# Patient Record
Sex: Male | Born: 1950 | Race: Black or African American | Hispanic: No | Marital: Married | Smoking: Former smoker
Health system: Southern US, Community
[De-identification: ages and names within clinical notes are randomized; demographics above are authoritative.]

## PROBLEM LIST (undated history)

## (undated) DIAGNOSIS — E119 Type 2 diabetes mellitus without complications: Secondary | ICD-10-CM

## (undated) DIAGNOSIS — Z5320 Procedure and treatment not carried out because of patient's decision for unspecified reasons: Secondary | ICD-10-CM

## (undated) DIAGNOSIS — M5136 Other intervertebral disc degeneration, lumbar region: Secondary | ICD-10-CM

## (undated) DIAGNOSIS — I10 Essential (primary) hypertension: Secondary | ICD-10-CM

## (undated) DIAGNOSIS — E785 Hyperlipidemia, unspecified: Secondary | ICD-10-CM

## (undated) DIAGNOSIS — M51369 Other intervertebral disc degeneration, lumbar region without mention of lumbar back pain or lower extremity pain: Secondary | ICD-10-CM

## (undated) HISTORY — DX: Type 2 diabetes mellitus without complications: E11.9

## (undated) HISTORY — PX: HYDROCELE EXCISION / REPAIR: SUR1145

## (undated) HISTORY — DX: Hyperlipidemia, unspecified: E78.5

## (undated) HISTORY — DX: Other intervertebral disc degeneration, lumbar region: M51.36

## (undated) HISTORY — DX: Procedure and treatment not carried out because of patient's decision for unspecified reasons: Z53.20

## (undated) HISTORY — PX: TOOTH EXTRACTION: SUR596

## (undated) HISTORY — DX: Other intervertebral disc degeneration, lumbar region without mention of lumbar back pain or lower extremity pain: M51.369

---

## 2011-02-08 HISTORY — PX: KIDNEY STONE SURGERY: SHX686

## 2012-07-03 ENCOUNTER — Emergency Department (HOSPITAL_BASED_OUTPATIENT_CLINIC_OR_DEPARTMENT_OTHER): Payer: BC Managed Care – PPO

## 2012-07-03 ENCOUNTER — Emergency Department (HOSPITAL_BASED_OUTPATIENT_CLINIC_OR_DEPARTMENT_OTHER)
Admission: EM | Admit: 2012-07-03 | Discharge: 2012-07-03 | Disposition: A | Discharge status: Home / Self Care | Payer: BC Managed Care – PPO | Attending: Emergency Medicine | Admitting: Emergency Medicine

## 2012-07-03 ENCOUNTER — Encounter (HOSPITAL_BASED_OUTPATIENT_CLINIC_OR_DEPARTMENT_OTHER): Payer: Self-pay | Admitting: *Deleted

## 2012-07-03 DIAGNOSIS — N200 Calculus of kidney: Secondary | ICD-10-CM | POA: Insufficient documentation

## 2012-07-03 DIAGNOSIS — N1330 Unspecified hydronephrosis: Secondary | ICD-10-CM | POA: Insufficient documentation

## 2012-07-03 DIAGNOSIS — N509 Disorder of male genital organs, unspecified: Secondary | ICD-10-CM | POA: Insufficient documentation

## 2012-07-03 DIAGNOSIS — R110 Nausea: Secondary | ICD-10-CM | POA: Insufficient documentation

## 2012-07-03 LAB — URINALYSIS, ROUTINE W REFLEX MICROSCOPIC
Ketones, ur: NEGATIVE mg/dL
Leukocytes, UA: NEGATIVE
Nitrite: NEGATIVE
Specific Gravity, Urine: 1.017 (ref 1.005–1.030)
pH: 6 (ref 5.0–8.0)

## 2012-07-03 LAB — URINE MICROSCOPIC-ADD ON

## 2012-07-03 LAB — BASIC METABOLIC PANEL
Calcium: 10.3 mg/dL (ref 8.4–10.5)
Creatinine, Ser: 1.2 mg/dL (ref 0.50–1.35)
GFR calc Af Amer: 73 mL/min — ABNORMAL LOW (ref >90)
GFR calc non Af Amer: 63 mL/min — ABNORMAL LOW (ref >90)
Sodium: 141 mEq/L (ref 135–145)

## 2012-07-03 LAB — CBC WITH DIFFERENTIAL/PLATELET
Basophils Absolute: 0 10*3/uL (ref 0.0–0.1)
Eosinophils Relative: 3 % (ref 0–5)
Lymphocytes Relative: 30 % (ref 12–46)
Neutro Abs: 3.6 10*3/uL (ref 1.7–7.7)
Neutrophils Relative %: 55 % (ref 43–77)
Platelets: 195 10*3/uL (ref 150–400)
RBC: 5.5 MIL/uL (ref 4.22–5.81)
RDW: 13.3 % (ref 11.5–15.5)
WBC: 6.6 10*3/uL (ref 4.0–10.5)

## 2012-07-03 MED ORDER — IBUPROFEN 600 MG PO TABS: 600.0000 mg | ORAL_TABLET | Freq: Four times a day (QID) | ORAL | Status: DC | PRN

## 2012-07-03 MED ORDER — ONDANSETRON 8 MG PO TBDP: 8.0000 mg | ORAL_TABLET | Freq: Three times a day (TID) | ORAL | Status: DC | PRN

## 2012-07-03 MED ORDER — HYDROCODONE-ACETAMINOPHEN 5-325 MG PO TABS: 1.0000 | ORAL_TABLET | Freq: Four times a day (QID) | ORAL | Status: DC | PRN

## 2012-07-03 MED ORDER — HYDROCODONE-ACETAMINOPHEN 5-325 MG PO TABS
1.0000 | ORAL_TABLET | Freq: Once | ORAL | Status: AC
  Administered 2012-07-03: 1 via ORAL
  Filled 2012-07-03: qty 1

## 2012-07-03 MED ORDER — MORPHINE SULFATE 4 MG/ML IJ SOLN
4.0000 mg | Freq: Once | INTRAMUSCULAR | Status: AC
  Administered 2012-07-03: 4 mg via INTRAVENOUS
  Filled 2012-07-03: qty 1

## 2012-07-03 MED ORDER — TAMSULOSIN HCL 0.4 MG PO CAPS: 0.4000 mg | ORAL_CAPSULE | Freq: Every day | ORAL | Status: DC

## 2012-07-03 NOTE — ED Notes (Signed)
Patient transported to CT 

## 2012-07-03 NOTE — ED Notes (Signed)
Pt c/o pain in his left scrotum amd left inguinal area radiating up into his abd. Pt sts he feels the urge to urinate but cannot. Pt sts he had a hydrocele before that felt similar.

## 2012-07-03 NOTE — ED Notes (Signed)
Pt back from CT

## 2012-07-03 NOTE — ED Notes (Signed)
MD at bedside. 

## 2012-07-03 NOTE — ED Provider Notes (Addendum)
History     CSN: [id number]  Arrival date & time 07/03/12  0300   First MD Initiated Contact with Patient 07/03/12 [phone]      Chief Complaint  Patient presents with  . Groin Pain    (Consider location/radiation/quality/duration/timing/severity/associated sxs/prior treatment) HPI Comments: Pt comes in with cc of left sided scrotal, ingional and LLQ abd pain. The pain onset was sudden, around 1 am, woke him up from sleep. The pain is intermittent, and severe when present. No back pain, + nausea, diaphoresis. No hx of renal stones, no trauma, no uti like sx.  Patient is a 62 y.o. male presenting with groin pain. The history is provided by the patient.  Groin Pain Associated symptoms include abdominal pain. Pertinent negatives include no chest pain and no shortness of breath.    History reviewed. No pertinent past medical history.  Past Surgical History  Procedure Laterality Date  . Hydrocele excision / repair      No family history on file.  History  Substance Use Topics  . Smoking status: Never Smoker   . Smokeless tobacco: Not on file  . Alcohol Use: No      Review of Systems  Constitutional: Negative for activity change and appetite change.  Respiratory: Negative for cough and shortness of breath.   Cardiovascular: Negative for chest pain.  Gastrointestinal: Positive for nausea and abdominal pain.  Genitourinary: Positive for testicular pain. Negative for dysuria and penile pain.    Allergies  Review of patient's allergies indicates no known allergies.  Home Medications  No current outpatient prescriptions on file.  BP 191/107  Pulse 76  Temp(Src) 98.6 F (37 C) (Oral)  Resp 18  Wt 220 lb (99.791 kg)  SpO2 96%  Physical Exam  Nursing note and vitals reviewed. Constitutional: He is oriented to person, place, and time. He appears well-developed.  HENT:  Head: Normocephalic and atraumatic.  Eyes: Conjunctivae and EOM are normal. Pupils are equal, round,  and reactive to light.  Neck: Normal range of motion. Neck supple.  Cardiovascular: Normal rate and regular rhythm.   Pulmonary/Chest: Effort normal and breath sounds normal.  Abdominal: Soft. Bowel sounds are normal. He exhibits no distension. There is no tenderness. There is no rebound and no guarding.  Genitourinary:  No scrotal swelling, erythema, mild left testicular tenderness with palpation, no inguinal hernia, mild left sided flank tenderness, mild suprapubic tenderness  Neurological: He is alert and oriented to person, place, and time.  Skin: Skin is warm.    ED Course  Procedures (including critical care time)  Labs Reviewed  CBC WITH DIFFERENTIAL  BASIC METABOLIC PANEL  URINALYSIS, ROUTINE W REFLEX MICROSCOPIC   No results found.   No diagnosis found.    MDM  Pt comes in with cc of left sided lower abd pain, groin pain, testicular pain.  DDx includes: Diverticulitis Hernia Nephrolithiasis Pyelonephritis UTI/Cystitis STD Testicular torsion Epididymitis Hydrocoele Inguinal hernia Fournier's gangrene (necrotizing fasciitis of the perineum),  Torsion of the appendix testis Testicular cancer  Based on hx and exam - no concerns for torsion, or scrotal infection. This could be due to mobile ureteral stone. With no hx of renal stones, will get CT scan in this elderly male, to ensure we are not missing anything else critical. During the exam, the patient was comfortable - which makes a non obstructive stone possibility more likely.   Derwood Kaplan, MD 07/03/12 0503  6:02 AM CT results reviewed. Non specific perinephric far stranding with a stone  right at the renal pelvis with mild hydronephrosis. No ureteral stone. Will tx for stone, as suspicion for any acute scrotal condition or vascular problem  low. Will dc. Return precautions discussed  Derwood Kaplan, MD 07/03/12 [phone]

## 2012-08-15 ENCOUNTER — Emergency Department (HOSPITAL_BASED_OUTPATIENT_CLINIC_OR_DEPARTMENT_OTHER)
Admission: EM | Admit: 2012-08-15 | Discharge: 2012-08-15 | Disposition: A | Discharge status: Home / Self Care | Payer: BC Managed Care – PPO | Attending: Emergency Medicine | Admitting: Emergency Medicine

## 2012-08-15 ENCOUNTER — Encounter (HOSPITAL_BASED_OUTPATIENT_CLINIC_OR_DEPARTMENT_OTHER): Payer: Self-pay | Admitting: Emergency Medicine

## 2012-08-15 DIAGNOSIS — Z79899 Other long term (current) drug therapy: Secondary | ICD-10-CM | POA: Insufficient documentation

## 2012-08-15 DIAGNOSIS — R109 Unspecified abdominal pain: Secondary | ICD-10-CM | POA: Insufficient documentation

## 2012-08-15 DIAGNOSIS — I10 Essential (primary) hypertension: Secondary | ICD-10-CM

## 2012-08-15 DIAGNOSIS — R319 Hematuria, unspecified: Secondary | ICD-10-CM

## 2012-08-15 DIAGNOSIS — Z87442 Personal history of urinary calculi: Secondary | ICD-10-CM | POA: Insufficient documentation

## 2012-08-15 DIAGNOSIS — N23 Unspecified renal colic: Secondary | ICD-10-CM

## 2012-08-15 LAB — URINALYSIS, ROUTINE W REFLEX MICROSCOPIC
Glucose, UA: NEGATIVE mg/dL
Protein, ur: NEGATIVE mg/dL

## 2012-08-15 LAB — URINE MICROSCOPIC-ADD ON

## 2012-08-15 MED ORDER — LISINOPRIL 10 MG PO TABS: 10.0000 mg | ORAL_TABLET | Freq: Every day | ORAL | Status: DC

## 2012-08-15 MED ORDER — CLONIDINE HCL 0.1 MG PO TABS
0.1000 mg | ORAL_TABLET | Freq: Once | ORAL | Status: AC
  Administered 2012-08-15: 0.1 mg via ORAL
  Filled 2012-08-15: qty 1

## 2012-08-15 MED ORDER — HYDROCODONE-ACETAMINOPHEN 5-325 MG PO TABS: 2.0000 | ORAL_TABLET | ORAL | Status: DC | PRN

## 2012-08-15 NOTE — ED Notes (Signed)
Pt reports blood in urine since this am and lower abd pain.

## 2012-08-15 NOTE — ED Provider Notes (Addendum)
History    CSN: [id number] Arrival date & time 08/15/12  1909  First MD Initiated Contact with Patient 08/15/12 1923     Chief Complaint  Patient presents with  . Hematuria  . Abdominal Pain   (Consider location/radiation/quality/duration/timing/severity/associated sxs/prior Treatment) HPI Pt with known 5 x 6 mm R renal stone diagnosed 1 month prior. Referred to urologist but has yet to see. States today he has had episodic suprapubic pressure and noticed dark urine today. No fever or chills. No N/V. No current symptoms.  Past Medical History  Diagnosis Date  . Kidney stone    Past Surgical History  Procedure Laterality Date  . Hydrocele excision / repair     No family history on file. History  Substance Use Topics  . Smoking status: Never Smoker   . Smokeless tobacco: Not on file  . Alcohol Use: No    Review of Systems  Constitutional: Negative for fever and chills.  Gastrointestinal: Positive for abdominal pain. Negative for nausea, vomiting and diarrhea.  Genitourinary: Positive for hematuria. Negative for dysuria and flank pain.  Musculoskeletal: Negative for back pain.  Skin: Negative for rash and wound.  All other systems reviewed and are negative.    Allergies  Review of patient's allergies indicates no known allergies.  Home Medications   Current Outpatient Rx  Name  Route  Sig  Dispense  Refill  . HYDROcodone-acetaminophen (NORCO) 5-325 MG per tablet   Oral   Take 2 tablets by mouth every 4 (four) hours as needed for pain.   10 tablet   0   . HYDROcodone-acetaminophen (NORCO/VICODIN) 5-325 MG per tablet   Oral   Take 1 tablet by mouth every 6 (six) hours as needed for pain (severe pain only).   15 tablet   0   . ibuprofen (ADVIL,MOTRIN) 600 MG tablet   Oral   Take 1 tablet (600 mg total) by mouth every 6 (six) hours as needed for pain.   30 tablet   0   . lisinopril (PRINIVIL,ZESTRIL) 10 MG tablet   Oral   Take 1 tablet (10 mg total) by  mouth daily.   30 tablet   0   . ondansetron (ZOFRAN ODT) 8 MG disintegrating tablet   Oral   Take 1 tablet (8 mg total) by mouth every 8 (eight) hours as needed for nausea.   20 tablet   0   . tamsulosin (FLOMAX) 0.4 MG CAPS   Oral   Take 1 capsule (0.4 mg total) by mouth daily.   7 capsule   0    BP 182/101  Pulse 62  Temp(Src) 98.3 F (36.8 C) (Oral)  Resp 18  Ht 5' 10.5" (1.791 m)  Wt 220 lb (99.791 kg)  BMI 31.11 kg/m2  SpO2 97% Physical Exam  Nursing note and vitals reviewed. Constitutional: He is oriented to person, place, and time. He appears well-developed and well-nourished. No distress.  HENT:  Head: Normocephalic and atraumatic.  Mouth/Throat: Oropharynx is clear and moist.  Eyes: EOM are normal. Pupils are equal, round, and reactive to light.  Neck: Normal range of motion. Neck supple.  Cardiovascular: Normal rate and regular rhythm.   Pulmonary/Chest: Effort normal and breath sounds normal. No respiratory distress. He has no wheezes. He has no rales.  Abdominal: Soft. Bowel sounds are normal. He exhibits no distension and no mass. There is no tenderness. There is no rebound and no guarding.  Musculoskeletal: Normal range of motion. He exhibits no edema  and no tenderness.  Neurological: He is alert and oriented to person, place, and time.  Skin: Skin is warm and dry. No rash noted. No erythema.  Psychiatric: He has a normal mood and affect. His behavior is normal.    ED Course  Procedures (including critical care time) Labs Reviewed  URINALYSIS, ROUTINE W REFLEX MICROSCOPIC - Abnormal; Notable for the following:    Specific Gravity, Urine 1.004 (*)    Hgb urine dipstick LARGE (*)    All other components within normal limits  URINE MICROSCOPIC-ADD ON - Abnormal; Notable for the following:    Squamous Epithelial / LPF FEW (*)    All other components within normal limits   No results found. 1. Hematuria   2. Renal colic   3. Uncontrolled hypertension      MDM  Pt encouraged to f/u with urologist. Will refill Norco. Return precautions given.   Loren Racer, MD 08/15/12 2004  Pt with history of longstanding HTN not being treated. He currently has no HA,visual changes weakness. Will start low dose lisinopril and pt is advised to establish PMD to manage BP. Return precautions given.   Loren Racer, MD 08/15/12 2021

## 2012-08-15 NOTE — Discharge Instructions (Signed)
You need to establish a primary care doctor to manage your blood pressure.   Kidney Stones Kidney stones (ureteral lithiasis) are deposits that form inside your kidneys. The intense pain is caused by the stone moving through the urinary tract. When the stone moves, the ureter goes into spasm around the stone. The stone is usually passed in the urine.  CAUSES   A disorder that makes certain neck glands produce too much parathyroid hormone (primary hyperparathyroidism).  A buildup of uric acid crystals.  Narrowing (stricture) of the ureter.  A kidney obstruction present at birth (congenital obstruction).  Previous surgery on the kidney or ureters.  Numerous kidney infections. SYMPTOMS   Feeling sick to your stomach (nauseous).  Throwing up (vomiting).  Blood in the urine (hematuria).  Pain that usually spreads (radiates) to the groin.  Frequency or urgency of urination. DIAGNOSIS   Taking a history and physical exam.  Blood or urine tests.  Computerized X-ray scan (CT scan).  Occasionally, an examination of the inside of the urinary bladder (cystoscopy) is performed. TREATMENT   Observation.  Increasing your fluid intake.  Surgery may be needed if you have severe pain or persistent obstruction. The size, location, and chemical composition are all important variables that will determine the proper choice of action for you. Talk to your caregiver to better understand your situation so that you will minimize the risk of injury to yourself and your kidney.  HOME CARE INSTRUCTIONS   Drink enough water and fluids to keep your urine clear or pale yellow.  Strain all urine through the provided strainer. Keep all particulate matter and stones for your caregiver to see. The stone causing the pain may be as small as a grain of salt. It is very important to use the strainer each and every time you pass your urine. The collection of your stone will allow your caregiver to analyze it  and verify that a stone has actually passed.  Only take over-the-counter or prescription medicines for pain, discomfort, or fever as directed by your caregiver.  Make a follow-up appointment with your caregiver as directed.  Get follow-up X-rays if required. The absence of pain does not always mean that the stone has passed. It may have only stopped moving. If the urine remains completely obstructed, it can cause loss of kidney function or even complete destruction of the kidney. It is your responsibility to make sure X-rays and follow-ups are completed. Ultrasounds of the kidney can show blockages and the status of the kidney. Ultrasounds are not associated with any radiation and can be performed easily in a matter of minutes. SEEK IMMEDIATE MEDICAL CARE IF:   Pain cannot be controlled with the prescribed medicine.  You have a fever.  The severity or intensity of pain increases over 18 hours and is not relieved by pain medicine.  You develop a new onset of abdominal pain.  You feel faint or pass out. MAKE SURE YOU:   Understand these instructions.  Will watch your condition.  Will get help right away if you are not doing well or get worse. Document Released: 01/24/2005 Document Revised: 04/18/2011 Document Reviewed: 05/22/2009 [facility] Patient Information 2014 [place], [place].  RESOURCE GUIDE  Chronic Pain Problems: Contact [facility]  [phone] Patients need to be referred by their primary care doctor.  Insufficient Money for Medicine: Contact United Way:  call "211."   No Primary Care Doctor: - Call Health Connect  [phone] - can help you locate a  primary care doctor that  accepts your insurance, provides certain services, etc. - Physician Referral Service- [phone]  Agencies that provide inexpensive medical care: - [facility]  [phone] - [facility]  [phone] - [facility]  [phone] - Women's  Clinic  [phone] - Planned Parenthood  [phone] [facility]  [phone]  Medicaid-accepting [facility] Providers: - [facility]- [address]  [phone], Mon-Fri 9am-7pm, Sat 9am-1pm - [facility]- [address] [place], Suite [place]  [phone] - [facility]- [address] [place]  [phone] [facility]- [address]  [phone] - Renaye Rakers- [address] [place], [address], [phone]  Only accepts [place] patients after they have their name  applied to their card  Self Pay (no insurance) in [facility]: - Sickle Cell Patients - [facility]  [address] [place], [phone] - [facility]gent Care- [address] [place]  [phone]       [facility] [place]- [address]       -     [facility]- see information above (Speak to [employer] if you do not have insurance)       -  [facility]- [address],  [phone]       -  [facility]- [address] [phone]       -  Dr Julio Sicks-  [address], [place], [phone]       -  [facility] - [address] [phone]       -  [facility]- [address], [phone], also [address] [phone]       -     [facility]- [address] [place], [phone], 1st & 3rd Saturday         every month, 10am-1pm  -     [facility]   [address] [place], [place].   Phone:  [phone], Fax:  [phone]. Hours of Operation:  9 am - 6 pm, M-F.  -     [facility]   [address], [place]   Phone: [phone], Fax: [phone]. Hours of Operation:  8:30 am - 5:30 pm, M-F.  [facility] [address] [place], [place] [address] [phone]  [facility] [address] [place], [place] [address] [phone]  1) Find a Doctor and Pay Out of Pocket Although you won't have to find out who is covered by your insurance plan, it is a good idea to ask around and get recommendations. You will then need to call the office and see if the doctor you have chosen will accept you as a new patient and what types of options they offer for patients who are self-pay. Some doctors offer discounts or will set up payment plans for their patients who do not have insurance, but you will need to ask so you aren't surprised when you get to your appointment.  2) Contact Your Local Health Department Not all health departments have doctors that can see patients for sick visits, but many do, so it is worth a call to see if yours does. If you don't  know where your local health department is, you can check in your phone book. The CDC also has a tool to help you locate your state's health department, and many state websites also have listings of all of their local health departments.  3) Find a Walk-in Clinic If your illness is not likely to be very severe or complicated, you may want to try a walk in clinic. These are popping up all over the country in pharmacies, drugstores, and shopping centers. They're usually staffed by nurse practitioners or physician assistants that have been trained to treat common illnesses and complaints. They're usually fairly quick and inexpensive. However, if you have serious medical issues or chronic medical problems, these are probably not your best option  STD Testing - [facility] of [facility], STD Clinic, [address], [place], phone [phone] or [phone].  Monday - Friday, call for an appointment. [facility] of [employer], STD Clinic, [address], [place], phone [phone] or [phone].  Monday - Friday, call for an appointment.  Abuse/Neglect: [facility] Child  Abuse Hotline 302-639-9898 [facility] Child Abuse Hotline [phone] (After Hours)  Emergency Shelter:  [facility] [phone]  Maternity Homes: - [facility] [phone] - [facility] [phone]  MRSA Hotline #:   571 538 1318  Dental Assistance If unable to pay or uninsured, contact:  [facility]. to become qualified for the adult dental clinic.  Patients with Medicaid: [facility] [phone] [address] Qu[address], [phone] [address] [phone]  If unable to pay, or uninsured, contact [facility] [phone] in [place], [phone] in [facility]) to become qualified for the adult dental clinic  [facility] [address] [place], [place] [address] [phone] [email or web address]  Other Proofreader Services: - [facility]- [address] [place], [place], [place], [address], [phone], Ext. 123, 2nd and 4th Thursday of the month at 6:30am.  10 clients each day by appointment, can sometimes see walk-in patients if someone does not show for an appointment. [facility]- [address] [place], [place], [address], [phone] - [facility] [address], [place], [place], [address], [phone] - [facility]- [phone] [facility]- [phone] [facility] Health [phone]       Behavioral Health Resources in the [facility]  Intensive Outpatient Programs: [facility]      [address] [place], [place] [phone] Both a day and evening program       [facility] Outpatient     [address]        [place], [place] [address] [phone]         ADS: [facility] [address] [place] [phone]  [facility] Mental Health ACCESS LINE: [phone] or [phone] [address] [place], [place] [address] [email or web address]   Substance Abuse Resources: - Alcohol and Drug Services  ([phone] - [facility] [phone] - [facility] [phone] Floydene Flock [phone] - Residential & Outpatient Substance Abuse Program  [phone]  Psychological Services: [facility]  [phone] [facility]  [phone] - [facility], [phone] [place]. [address] [place], ACCESS LINE: 430-136-1728 or (305)[phone], [email or web address]  Mobile Crisis Teams:  [facility]atives         Mobile Crisis Care Unit [phone]             [facility] [address] Ginette Otto [phone]                                         Interventionist [address] [place] [address] [place] [phone]  Self-Help/Support Groups: Mental Health Assoc. of [employer] of support groups (907)168-8307 (call for more info)  Narcotics Anonymous (NA) Caring [facility] [address] [place] - 2 meetings at this location  Residential Treatment Programs:  [facility]      [address]        [place]       [phone]         [facility] [address], [place] [address]74 [place], [place]  [address] [phone]  [facility]  [address] [place], [place] [address] [phone] Admissions: 8am-3pm M-F  [facility]     [address]        [place], [place] [address]       [phone]         [facility] [address] [place], [place] [phone]  [facility] [address] [place], [place] [phone]  [facility] - Intensive Outpatient      [address]     [place], [place]       [phone]         [facility] (Addiction  Recovery Care Assoc.)     [address] [place], [place] [phone] or [phone]  Residential Treatment Services (RTS), Medicaid [address] [place], [place] [phone]  [facility]ship [address]                                               [address] [place] [phone]  [facility] Resources: [phone]               General Therapy                                                Angie Fava, PhD        [address] [place], [place] [address]         [phone]   Insurance  [facility]   [address] [place], [place] [address] [phone]  [facility] [address] [place], [place] [address] [phone] Insurance/Medicaid/sponsorship through [facility] Families                                              [address] [phone]  [place], [place] [address]    Therapy/tele-psych/case         [phone]          [facility] [address], [place]  [address]  Adolescent/group home/case management [phone]                                           Creola Corn PhD       General therapy       Insurance   [phone]         Dr. Lolly Mustache, [place], M-F [phone]  [facility] [place]  [employer] [facility] Dept. [address]                 [address]         [address] [place] [address]  Blondell Reveal Phone:  [phone]                                  Phone:  [phone]                   Phone:  [phone]  [facility] Mental Health, [phone] - [facility] - [facility]- [phone]       -     [facility] in [place], [address]             [phone], Insurance  [facility] Abuse  Hotline 2023155522 or [phone] (After Hours)

## 2012-08-21 ENCOUNTER — Other Ambulatory Visit: Payer: Self-pay | Admitting: Urology

## 2012-08-23 ENCOUNTER — Encounter (HOSPITAL_COMMUNITY): Payer: Self-pay | Admitting: *Deleted

## 2012-08-26 NOTE — H&P (Signed)
ctive Problems Problems  1. Gross Hematuria 599.71 2. Nephrolithiasis Left 592.0  History of Present Illness  Maxwell Hanson is a 62 yo BM who is sent from the ER.  He had the onset one month ago of mid lower abdominal pain.  The pain was severe with some nausea without vomiting.   He had a CT on 5/27 that showed a 5x81mm left upper stone with calyceal obstruction.   He was sent out with pain med and a strainer.   He had the onset last week of gross hematuria and went back to the ER.  He hasn't passed a stone.  He has no voiding symptoms.  He has no prior GU history.   Past Medical History Problems  1. History of  Hypertension 401.9 2. History of  Hypertension 401.9 3. History of  Nephrolithiasis V13.01 4. 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 History of  Surgery Spermatic Cord Excision Of Hydrocele  Current Meds 1. Hydrocodone-Acetaminophen 5-325 MG Oral Tablet; Therapy: (Recorded:15Jul2014) to 2. Lisinopril 10 MG Oral Tablet; Therapy: (Recorded:15Jul2014) to  Allergies Medication  1. No Known Drug Allergies  Family History Problems  1. Family history of  Death In The Family Father Died age 74-Cause unknown 2. Family history of  Family Health Status Number Of Children 1 boy and 2 girls  Social History Problems    Marital History - Currently Married   Never A Smoker   Occupation: Retired Denied    History of  Alcohol Use   History of  Caffeine Use   History of  Tobacco Use 305.1  Review of Systems Genitourinary, constitutional, skin, eye, otolaryngeal, hematologic/lymphatic, cardiovascular, pulmonary, endocrine, musculoskeletal, gastrointestinal, neurological and psychiatric system(s) were reviewed and pertinent findings if present are noted.  Genitourinary: nocturia and urinary stream starts and stops.  Integumentary: skin rash/lesion and pruritus.  Eyes: blurred vision.    Vitals Vital Signs [Data Includes: Last 1 Day]  15Jul2014  09:57AM  BMI Calculated: 30.8 BSA Calculated: 2.19 Height: 5 ft 11 in Weight: 220 lb  Blood Pressure: 192 / 118 Temperature: 98 F Heart Rate: 63  Physical Exam Constitutional: Well nourished and well developed . No acute distress.  ENT:. The ears and nose are normal in appearance.  Neck: The appearance of the neck is normal and no neck mass is present.  Pulmonary: No respiratory distress and normal respiratory rhythm and effort.  Cardiovascular: Heart rate and rhythm are normal . No peripheral edema.  Abdomen: The abdomen is soft and nontender (except for mild RUQ tenderness). No masses are palpated. No CVA tenderness. No hernias are palpable. No hepatosplenomegaly noted.  Rectal: Rectal exam demonstrates normal sphincter tone, no tenderness and no masses. Estimated prostate size is 2+. The prostate has no nodularity and is not tender. The left seminal vesicle is nonpalpable. The right seminal vesicle is nonpalpable. The perineum is normal on inspection.  Genitourinary: Examination of the penis demonstrates no discharge, no masses, no lesions and a normal meatus. The scrotum is without lesions. The right epididymis is palpably normal and non-tender. The left epididymis is palpably normal and non-tender. The right testis is non-tender and without masses. The left testis is non-tender and without masses.  Lymphatics: The femoral and inguinal nodes are not enlarged or tender.  Skin: Normal skin turgor, no visible rash and no visible skin lesions.  Neuro/Psych:. Mood and affect are appropriate.    Results/Data Urine [Data Includes: Last 1 Day]   15Jul2014  COLOR AMBER  APPEARANCE CLEAR   SPECIFIC GRAVITY 1.030   pH 6.0   GLUCOSE NEG mg/dL  BILIRUBIN NEG   KETONE NEG mg/dL  BLOOD LARGE   PROTEIN 30 mg/dL  UROBILINOGEN 2 mg/dL  NITRITE NEG   LEUKOCYTE ESTERASE NEG   SQUAMOUS EPITHELIAL/HPF NONE SEEN   WBC 0-2 WBC/hpf  RBC 21-50 RBC/hpf  BACTERIA FEW   CRYSTALS NONE SEEN   CASTS  NONE SEEN    Old records or history reviewed: I have reviewed his ER records.  The following images/tracing/specimen were independently visualized:  I have reviewed his CT films. KUB today shows a 4x79mm stone in the area of the right renal pelvis/UPJ that is minimal changed from the CT. There are pelvic phleboliths but the film is otherwise unremarkable.    Assessment Assessed  1. Nephrolithiasis Left 592.0 2. Gross Hematuria 599.71   He has a left renal/UPJ stone with hematuria and occasional pain.  It has not moved much since May.   Plan Health Maintenance (V70.0)  1. UA With REFLEX  Done: 15Jul2014 09:42AM Nephrolithiasis (592.0)  2. KUB  Done: 15Jul2014 12:00AM 3. Follow-up Schedule Surgery Office  Follow-up  Requested for: 15Jul2014   I discussed the options including ESWL and ureteroscopy.  He has declined tamsulosin. I will set him up for ESWL and reviewed the risks of bleeding, infection, renal and adjacent organ injury that can be severe or life threatening, failure to fragment with need for secondary procedures, thrombotic events and anesthetic complications.

## 2012-08-27 ENCOUNTER — Ambulatory Visit (HOSPITAL_COMMUNITY)
Admission: RE | Admit: 2012-08-27 | Discharge: 2012-08-27 | Disposition: A | Discharge status: Home / Self Care | Payer: BC Managed Care – PPO | Source: Ambulatory Visit | Attending: Urology | Admitting: Urology

## 2012-08-27 ENCOUNTER — Encounter (HOSPITAL_COMMUNITY)
Admission: RE | Disposition: A | Discharge status: Home / Self Care | Payer: Self-pay | Source: Ambulatory Visit | Attending: Urology

## 2012-08-27 ENCOUNTER — Ambulatory Visit (HOSPITAL_COMMUNITY): Payer: BC Managed Care – PPO

## 2012-08-27 ENCOUNTER — Encounter (HOSPITAL_COMMUNITY): Payer: Self-pay | Admitting: *Deleted

## 2012-08-27 DIAGNOSIS — R310 Gross hematuria: Secondary | ICD-10-CM | POA: Insufficient documentation

## 2012-08-27 DIAGNOSIS — I10 Essential (primary) hypertension: Secondary | ICD-10-CM | POA: Insufficient documentation

## 2012-08-27 DIAGNOSIS — N200 Calculus of kidney: Secondary | ICD-10-CM | POA: Insufficient documentation

## 2012-08-27 HISTORY — DX: Essential (primary) hypertension: I10

## 2012-08-27 SURGERY — LITHOTRIPSY, ESWL
Anesthesia: LOCAL | Laterality: Left

## 2012-08-27 MED ORDER — DIPHENHYDRAMINE HCL 25 MG PO CAPS
25.0000 mg | ORAL_CAPSULE | ORAL | Status: AC
  Administered 2012-08-27: 25 mg via ORAL
  Filled 2012-08-27: qty 1

## 2012-08-27 MED ORDER — CIPROFLOXACIN HCL 500 MG PO TABS
500.0000 mg | ORAL_TABLET | ORAL | Status: AC
  Administered 2012-08-27: 500 mg via ORAL
  Filled 2012-08-27: qty 1

## 2012-08-27 MED ORDER — ONDANSETRON HCL 4 MG/2ML IJ SOLN: 4.0000 mg | Freq: Four times a day (QID) | INTRAMUSCULAR | Status: DC | PRN

## 2012-08-27 MED ORDER — ACETAMINOPHEN 325 MG PO TABS
650.0000 mg | ORAL_TABLET | ORAL | Status: DC | PRN
  Administered 2012-08-27: 650 mg via ORAL
  Filled 2012-08-27: qty 2

## 2012-08-27 MED ORDER — SODIUM CHLORIDE 0.9 % IV SOLN: 250.0000 mL | INTRAVENOUS | Status: DC | PRN

## 2012-08-27 MED ORDER — DEXTROSE-NACL 5-0.45 % IV SOLN
INTRAVENOUS | Status: DC
  Administered 2012-08-27: 07:00:00 via INTRAVENOUS

## 2012-08-27 MED ORDER — OXYCODONE HCL 5 MG PO TABS
5.0000 mg | ORAL_TABLET | ORAL | Status: DC | PRN
  Administered 2012-08-27 (×2): 5 mg via ORAL
  Filled 2012-08-27 (×2): qty 1

## 2012-08-27 MED ORDER — OXYCODONE-ACETAMINOPHEN 5-325 MG PO TABS: 1.0000 | ORAL_TABLET | ORAL | Status: DC | PRN

## 2012-08-27 MED ORDER — SODIUM CHLORIDE 0.9 % IJ SOLN: 3.0000 mL | INTRAMUSCULAR | Status: DC | PRN

## 2012-08-27 MED ORDER — FENTANYL CITRATE 0.05 MG/ML IJ SOLN: 25.0000 ug | INTRAMUSCULAR | Status: DC | PRN

## 2012-08-27 MED ORDER — DIAZEPAM 5 MG PO TABS
10.0000 mg | ORAL_TABLET | ORAL | Status: AC
  Administered 2012-08-27: 10 mg via ORAL
  Filled 2012-08-27: qty 2

## 2012-08-27 MED ORDER — ACETAMINOPHEN 650 MG RE SUPP
650.0000 mg | RECTAL | Status: DC | PRN
  Filled 2012-08-27: qty 1

## 2012-08-27 MED ORDER — SODIUM CHLORIDE 0.9 % IJ SOLN: 3.0000 mL | Freq: Two times a day (BID) | INTRAMUSCULAR | Status: DC

## 2012-08-27 NOTE — Progress Notes (Signed)
Returned form ESWL via w/c States he is not cold but is shivering and eyes are bloodshot. Temp 97.7 BP 172/99 c/o left lower abd pain of 8/10 and states "it feels like it is moving" Pt up to BR with minimal assist and voided moderate amount of cherry red urine with a few clots then ambulated back to room approx 60 feet with minimal assist. Currently in recliner drinking gingerale and eating crackers

## 2012-08-27 NOTE — Progress Notes (Signed)
Pt's pain had gone to 5/10 but now has increased to 8/10 in groin area and pain medication repeated and pt was up to BR to void for a 3rd time. Pt states that voiding had decreased the pain slightly too  148/92

## 2012-08-27 NOTE — Interval H&P Note (Signed)
History and Physical Interval Note:  08/27/2012 7:42 AM  Maxwell Hanson  has presented today for surgery, with the diagnosis of LEFT RENAL STONE  The various methods of treatment have been discussed with the patient and family. After consideration of risks, benefits and other options for treatment, the patient has consented to  Procedure(s): EXTRACORPOREAL SHOCK WAVE LITHOTRIPSY (ESWL) (Left) as a surgical intervention .  The patient's history has been reviewed, patient examined, no change in status, stable for surgery.  I have reviewed the patient's chart and labs.  Questions were answered to the patient's satisfaction.     Saraphina Lauderbaugh J

## 2012-08-31 ENCOUNTER — Encounter (HOSPITAL_BASED_OUTPATIENT_CLINIC_OR_DEPARTMENT_OTHER): Payer: Self-pay | Admitting: Emergency Medicine

## 2012-08-31 ENCOUNTER — Emergency Department (HOSPITAL_BASED_OUTPATIENT_CLINIC_OR_DEPARTMENT_OTHER)
Admission: EM | Admit: 2012-08-31 | Discharge: 2012-08-31 | Disposition: A | Discharge status: Home / Self Care | Payer: BC Managed Care – PPO | Attending: Emergency Medicine | Admitting: Emergency Medicine

## 2012-08-31 ENCOUNTER — Emergency Department (HOSPITAL_BASED_OUTPATIENT_CLINIC_OR_DEPARTMENT_OTHER): Payer: BC Managed Care – PPO

## 2012-08-31 DIAGNOSIS — I10 Essential (primary) hypertension: Secondary | ICD-10-CM | POA: Insufficient documentation

## 2012-08-31 DIAGNOSIS — Z79899 Other long term (current) drug therapy: Secondary | ICD-10-CM | POA: Insufficient documentation

## 2012-08-31 DIAGNOSIS — R1110 Vomiting, unspecified: Secondary | ICD-10-CM | POA: Insufficient documentation

## 2012-08-31 DIAGNOSIS — Z791 Long term (current) use of non-steroidal anti-inflammatories (NSAID): Secondary | ICD-10-CM | POA: Insufficient documentation

## 2012-08-31 DIAGNOSIS — N200 Calculus of kidney: Secondary | ICD-10-CM | POA: Insufficient documentation

## 2012-08-31 DIAGNOSIS — N509 Disorder of male genital organs, unspecified: Secondary | ICD-10-CM | POA: Insufficient documentation

## 2012-08-31 LAB — CBC WITH DIFFERENTIAL/PLATELET
Basophils Absolute: 0 10*3/uL (ref 0.0–0.1)
Eosinophils Absolute: 0 10*3/uL (ref 0.0–0.7)
Eosinophils Relative: 1 % (ref 0–5)
HCT: 46.3 % (ref 39.0–52.0)
Lymphs Abs: 1.5 10*3/uL (ref 0.7–4.0)
MCH: 29.8 pg (ref 26.0–34.0)
MCV: 84 fL (ref 78.0–100.0)
Monocytes Absolute: 0.9 10*3/uL (ref 0.1–1.0)
Platelets: 207 10*3/uL (ref 150–400)
RDW: 12.9 % (ref 11.5–15.5)

## 2012-08-31 LAB — BASIC METABOLIC PANEL
CO2: 25 mEq/L (ref 19–32)
Calcium: 10.4 mg/dL (ref 8.4–10.5)
Creatinine, Ser: 1.2 mg/dL (ref 0.50–1.35)
GFR calc non Af Amer: 63 mL/min — ABNORMAL LOW (ref >90)
Glucose, Bld: 133 mg/dL — ABNORMAL HIGH (ref 70–99)
Sodium: 139 mEq/L (ref 135–145)

## 2012-08-31 LAB — URINALYSIS, ROUTINE W REFLEX MICROSCOPIC
Glucose, UA: NEGATIVE mg/dL
Nitrite: NEGATIVE

## 2012-08-31 LAB — URINE MICROSCOPIC-ADD ON

## 2012-08-31 MED ORDER — ONDANSETRON 8 MG PO TBDP: ORAL_TABLET | ORAL | Status: DC

## 2012-08-31 MED ORDER — NITROFURANTOIN MONOHYD MACRO 100 MG PO CAPS: 100.0000 mg | ORAL_CAPSULE | Freq: Two times a day (BID) | ORAL | Status: DC

## 2012-08-31 MED ORDER — TAMSULOSIN HCL 0.4 MG PO CAPS: 0.4000 mg | ORAL_CAPSULE | Freq: Every day | ORAL | Status: DC

## 2012-08-31 MED ORDER — ONDANSETRON HCL 4 MG/2ML IJ SOLN
4.0000 mg | Freq: Once | INTRAMUSCULAR | Status: AC
  Administered 2012-08-31: 4 mg via INTRAVENOUS
  Filled 2012-08-31: qty 2

## 2012-08-31 MED ORDER — IBUPROFEN 800 MG PO TABS: 800.0000 mg | ORAL_TABLET | Freq: Three times a day (TID) | ORAL | Status: DC

## 2012-08-31 MED ORDER — KETOROLAC TROMETHAMINE 30 MG/ML IJ SOLN
30.0000 mg | Freq: Once | INTRAMUSCULAR | Status: AC
  Administered 2012-08-31: 30 mg via INTRAVENOUS
  Filled 2012-08-31: qty 1

## 2012-08-31 MED ORDER — MORPHINE SULFATE 4 MG/ML IJ SOLN
4.0000 mg | Freq: Once | INTRAMUSCULAR | Status: AC
  Administered 2012-08-31: 4 mg via INTRAVENOUS
  Filled 2012-08-31: qty 1

## 2012-08-31 NOTE — ED Notes (Signed)
LLQ abd pain radiating to testicles and vomiting intermittently since having lithotripsy 3 days ago.

## 2012-08-31 NOTE — ED Provider Notes (Addendum)
CSN: [id number]     Arrival date & time 08/31/12  0002 History     First MD Initiated Contact with Patient 08/31/12 0021     Chief Complaint  Patient presents with  . Abdominal Pain  . Testicle Pain  . Emesis   (Consider location/radiation/quality/duration/timing/severity/associated sxs/prior Treatment) Patient is a 62 y.o. male presenting with abdominal pain, testicular pain, and vomiting. The history is provided by the patient.  Abdominal Pain This is a recurrent problem. The current episode started 6 to 12 hours ago. The problem occurs constantly. The problem has not changed since onset.Associated symptoms include abdominal pain. Pertinent negatives include no chest pain, no headaches and no shortness of breath. Nothing aggravates the symptoms. Nothing relieves the symptoms. He has tried nothing for the symptoms. The treatment provided no relief.  Testicle Pain This is a recurrent problem. The current episode started 6 to 12 hours ago. The problem occurs constantly. The problem has not changed since onset.Associated symptoms include abdominal pain. Pertinent negatives include no chest pain, no headaches and no shortness of breath. Nothing aggravates the symptoms. Nothing relieves the symptoms. He has tried nothing for the symptoms. The treatment provided no relief.  Emesis Associated symptoms: abdominal pain   Associated symptoms: no headaches   Had litho tripsy Monday and has had intermittent pain since.  Taking his narcotics but unable to keep the doses down due to emesis  Past Medical History  Diagnosis Date  . Kidney stone   . Hypertension    Past Surgical History  Procedure Laterality Date  . Hydrocele excision / repair     No family history on file. History  Substance Use Topics  . Smoking status: Never Smoker   . Smokeless tobacco: Not on file  . Alcohol Use: No    Review of Systems  Respiratory: Negative for shortness of breath.   Cardiovascular: Negative for  chest pain.  Gastrointestinal: Positive for vomiting and abdominal pain.  Genitourinary: Positive for testicular pain.  Neurological: Negative for headaches.  All other systems reviewed and are negative.    Allergies  Review of patient's allergies indicates no known allergies.  Home Medications   Current Outpatient Rx  Name  Route  Sig  Dispense  Refill  . HYDROcodone-acetaminophen (NORCO) 5-325 MG per tablet   Oral   Take 2 tablets by mouth every 4 (four) hours as needed for pain.   10 tablet   0   . HYDROcodone-acetaminophen (NORCO/VICODIN) 5-325 MG per tablet   Oral   Take 1 tablet by mouth every 6 (six) hours as needed for pain (severe pain only).   15 tablet   0   . ibuprofen (ADVIL,MOTRIN) 600 MG tablet   Oral   Take 1 tablet (600 mg total) by mouth every 6 (six) hours as needed for pain.   30 tablet   0   . ibuprofen (ADVIL,MOTRIN) 800 MG tablet   Oral   Take 1 tablet (800 mg total) by mouth 3 (three) times daily.   21 tablet   0   . lisinopril (PRINIVIL,ZESTRIL) 10 MG tablet   Oral   Take 1 tablet (10 mg total) by mouth daily.   30 tablet   0   . ondansetron (ZOFRAN ODT) 8 MG disintegrating tablet   Oral   Take 1 tablet (8 mg total) by mouth every 8 (eight) hours as needed for nausea.   20 tablet   0   . ondansetron (ZOFRAN ODT) 8 MG disintegrating tablet  8mg  ODT q8 hours prn nausea   6 tablet   0   . oxyCODONE-acetaminophen (ROXICET) 5-325 MG per tablet   Oral   Take 1 tablet by mouth every 4 (four) hours as needed for pain.   30 tablet   0   . tamsulosin (FLOMAX) 0.4 MG CAPS   Oral   Take 1 capsule (0.4 mg total) by mouth daily.   7 capsule   0   . tamsulosin (FLOMAX) 0.4 MG CAPS   Oral   Take 1 capsule (0.4 mg total) by mouth daily.   7 capsule   0    BP 186/101  Pulse 66  Temp(Src) 98.9 F (37.2 C) (Oral)  Resp 18  Ht 5\' 11"  (1.803 m)  Wt 220 lb (99.791 kg)  BMI 30.7 kg/m2  SpO2 99% Physical Exam  Constitutional:  He is oriented to person, place, and time. He appears well-developed and well-nourished. No distress.  HENT:  Head: Normocephalic and atraumatic.  Mouth/Throat: Oropharynx is clear and moist.  Eyes: Conjunctivae are normal. Pupils are equal, round, and reactive to light.  Neck: Normal range of motion. Neck supple.  Cardiovascular: Normal rate, regular rhythm and intact distal pulses.   Pulmonary/Chest: Effort normal and breath sounds normal. He has no wheezes. He has no rales.  Abdominal: Soft. Bowel sounds are normal. There is no tenderness. There is no rebound and no guarding.  Musculoskeletal: Normal range of motion.  Neurological: He is alert and oriented to person, place, and time.  Skin: Skin is warm and dry.  Psychiatric: He has a normal mood and affect.    ED Course   Procedures (including critical care time)  Labs Reviewed  CBC WITH DIFFERENTIAL - Abnormal; Notable for the following:    Monocytes Relative 13 (*)    All other components within normal limits  BASIC METABOLIC PANEL  URINALYSIS, ROUTINE W REFLEX MICROSCOPIC   Ct Abdomen Pelvis Wo Contrast  08/31/2012   *RADIOLOGY REPORT*  Clinical Data: Left flank pain, nausea, vomiting, testicular pain.  CT ABDOMEN AND PELVIS WITHOUT CONTRAST  Technique:  Multidetector CT imaging of the abdomen and pelvis was performed following the standard protocol without intravenous contrast.  Comparison: 07/03/2012  Findings: The lung bases are clear.  There are two stones in the proximal left ureter at the ureteropelvic junction.  The more inferior stone measures about 3 mm diameter in the more superior stone measures about 4 mm diameter.  There is mild proximal pyelocaliectasis with periureteral and pararenal stranding.  There is an additional 5 mm diameter nonobstructing stone in the lower pole of the left kidney. The distal left ureter is decompressed.  No stones are demonstrated in the right ureter or the bladder.  The right ureter and  pelvic collection system are decompressed.  There are calcified granulomas in the liver and spleen.  The unenhanced appearance of the gallbladder, pancreas, adrenal glands, inferior vena cava, abdominal aorta, and retroperitoneal lymph nodes is unremarkable.  The stomach and small bowel are decompressed.  Stool filled colon without distension.  Abdominal wall musculature appears intact.  No free fluid or free air in the abdomen.  Pelvis:  The appendix is normal.  No evidence of diverticulitis. The bladder wall is not thickened.  Prostate gland is not enlarged. No significant pelvic lymphadenopathy.  Degenerative changes in the lumbar spine.  IMPRESSION: Two stones demonstrated at the left ureteropelvic junction, measuring three and 4 mm diameter.  There is mild to moderate proximal obstruction.  Nonobstructing intrarenal stone in the left lower pole.   Original Report Authenticated By: Burman Nieves, M.D.   1. Kidney stones     MDM  Will add flomax and ibuprofen and zofran ODT to his narcotic regimen follow up in am with your urologist  Jarmar Rousseau K Shaterica Mcclatchy-Rasch, MD 08/31/12 0111  Taleia Sadowski K Allecia Bells-Rasch, MD 08/31/12 [phone]

## 2012-08-31 NOTE — ED Notes (Signed)
Pt reports having lithotripsy procedure on Monday, today approx 1400 began having acute onset of left flank pain and nausea. Pt states he has been noting hematuria since Monday however it appears to have cleared up. Pt is A&Ox4

## 2012-09-01 LAB — URINE CULTURE

## 2013-09-14 ENCOUNTER — Emergency Department (HOSPITAL_BASED_OUTPATIENT_CLINIC_OR_DEPARTMENT_OTHER)
Admission: EM | Admit: 2013-09-14 | Discharge: 2013-09-14 | Disposition: A | Discharge status: Home / Self Care | Payer: Medicare Other | Attending: Emergency Medicine | Admitting: Emergency Medicine

## 2013-09-14 ENCOUNTER — Emergency Department (HOSPITAL_BASED_OUTPATIENT_CLINIC_OR_DEPARTMENT_OTHER): Payer: Medicare Other

## 2013-09-14 ENCOUNTER — Encounter (HOSPITAL_BASED_OUTPATIENT_CLINIC_OR_DEPARTMENT_OTHER): Payer: Self-pay | Admitting: Emergency Medicine

## 2013-09-14 DIAGNOSIS — R51 Headache: Secondary | ICD-10-CM | POA: Insufficient documentation

## 2013-09-14 DIAGNOSIS — Z87442 Personal history of urinary calculi: Secondary | ICD-10-CM | POA: Insufficient documentation

## 2013-09-14 DIAGNOSIS — I10 Essential (primary) hypertension: Secondary | ICD-10-CM | POA: Insufficient documentation

## 2013-09-14 DIAGNOSIS — R519 Headache, unspecified: Secondary | ICD-10-CM

## 2013-09-14 LAB — BASIC METABOLIC PANEL
Anion gap: 11 (ref 5–15)
BUN: 10 mg/dL (ref 6–23)
CALCIUM: 10 mg/dL (ref 8.4–10.5)
CO2: 28 mEq/L (ref 19–32)
CREATININE: 1 mg/dL (ref 0.50–1.35)
Chloride: 103 mEq/L (ref 96–112)
GFR, EST NON AFRICAN AMERICAN: 78 mL/min — AB (ref >90)
GLUCOSE: 128 mg/dL — AB (ref 70–99)
POTASSIUM: 4.2 meq/L (ref 3.7–5.3)
Sodium: 142 mEq/L (ref 137–147)

## 2013-09-14 LAB — CBC WITH DIFFERENTIAL/PLATELET
BASOS PCT: 0 % (ref 0–1)
Basophils Absolute: 0 10*3/uL (ref 0.0–0.1)
EOS ABS: 0.1 10*3/uL (ref 0.0–0.7)
EOS PCT: 2 % (ref 0–5)
HCT: 46 % (ref 39.0–52.0)
Hemoglobin: 16.2 g/dL (ref 13.0–17.0)
LYMPHS ABS: 2 10*3/uL (ref 0.7–4.0)
Lymphocytes Relative: 38 % (ref 12–46)
MCH: 30 pg (ref 26.0–34.0)
MCHC: 35.2 g/dL (ref 30.0–36.0)
MCV: 85.2 fL (ref 78.0–100.0)
Monocytes Absolute: 0.6 10*3/uL (ref 0.1–1.0)
Monocytes Relative: 12 % (ref 3–12)
NEUTROS PCT: 47 % (ref 43–77)
Neutro Abs: 2.4 10*3/uL (ref 1.7–7.7)
PLATELETS: 168 10*3/uL (ref 150–400)
RBC: 5.4 MIL/uL (ref 4.22–5.81)
RDW: 13.4 % (ref 11.5–15.5)
WBC: 5.1 10*3/uL (ref 4.0–10.5)

## 2013-09-14 MED ORDER — LISINOPRIL 10 MG PO TABS: 10.0000 mg | ORAL_TABLET | Freq: Every day | ORAL | Status: DC

## 2013-09-14 NOTE — ED Provider Notes (Signed)
CSN: [id number]     Arrival date & time 09/14/13  3086 History   First MD Initiated Contact with Patient 09/14/13 [phone]     Chief Complaint  Patient presents with  . Headache      Patient is a 63 y.o. male presenting with headaches. The history is provided by the patient.  Headache Pain location:  Frontal Quality:  Dull Severity currently:  3/10 Onset quality:  Gradual Duration:  3 days Timing:  Intermittent Progression:  Improving Chronicity:  New Relieved by:  None tried Worsened by:  Nothing tried Associated symptoms: no blurred vision, no dizziness, no fever, no focal weakness, no hearing loss, no visual change, no vomiting and no weakness   PT reports gradual onset of HA for past 3 days No trauma No fever/vomiting No rash/tick bites reported No focal weakness No h/o CVA No slurred speech No visual changes No diplopia reported  He also reports mild sore throat No cough  Pt reports h/o HTN but he is not currently taking any medications  Past Medical History  Diagnosis Date  . Kidney stone   . Hypertension    Past Surgical History  Procedure Laterality Date  . Hydrocele excision / repair     No family history on file. History  Substance Use Topics  . Smoking status: Never Smoker   . Smokeless tobacco: Not on file  . Alcohol Use: No    Review of Systems  Constitutional: Negative for fever.  HENT: Negative for hearing loss.   Eyes: Negative for blurred vision and visual disturbance.  Respiratory: Negative for shortness of breath.   Cardiovascular: Negative for chest pain.  Gastrointestinal: Negative for vomiting.  Neurological: Positive for headaches. Negative for dizziness, focal weakness, speech difficulty and weakness.       Pt reports brief episode of numbness on both sides of head   All other systems reviewed and are negative.     Allergies  Review of patient's allergies indicates no known allergies.  Home Medications   Prior to Admission  medications   Not on File   BP 201/108  Pulse 56  Temp(Src) 98.3 F (36.8 C) (Oral)  Resp 20  Ht 5\' 10"  (1.778 m)  Wt 220 lb (99.791 kg)  BMI 31.57 kg/m2  SpO2 97% Physical Exam CONSTITUTIONAL: Well developed/well nourished HEAD: Normocephalic/atraumatic EYES: EOMI/PERRL, no nystagmus, no ptosis ENMT: Mucous membranes moist NECK: supple no meningeal signs, no bruits SPINE:entire spine nontender CV: S1/S2 noted, no murmurs/rubs/gallops noted LUNGS: Lungs are clear to auscultation bilaterally, no apparent distress ABDOMEN: soft, nontender, no rebound or guarding GU:no cva tenderness NEURO:Awake/alert, facies symmetric, no arm or leg drift is noted Equal 5/5 strength with shoulder abduction, elbow flex/extension, wrist flex/extension in upper extremities and equal hand grips bilaterally Equal 5/5 strength with hip flexion,knee flex/extension, foot dorsi/plantar flexion Cranial nerves 3/4/5/6/08/15/08/11/12 tested and intact Gait normal without ataxia No past pointing Sensation to light touch intact in all extremities EXTREMITIES: pulses normal, full ROM SKIN: warm, color normal PSYCH: no abnormalities of mood noted   ED Course  Procedures  7:25 AM Pt here with HA.  He is well appearing, no distress, he does not want medications at this time.  I suspect he has untreated/uncontrolled chronic HTN (previous visits review previous h/o HTN)  That may be contributing to his HA Due to age, will perform CT head.  If negative, further workup will be deferred.   Labs performed for screening purposes so we can start HTN meds at  discharge 8:37 AM PT WELL APPEARING, RESTING IN BED, INTERACTIVE I DOUBT HYPERTENSIVE CRISIS STABLE FOR D/C HOME WILL RESTART LISINOPRIL (HE TOLERATED BEFORE) GIVEN PCP REFERRALS HERE LOCALLY WE DISCUSSED STRICT RETURN PRECAUTIONS I DOUBT SAH/TEMPORAL ARTERITIS OR OTHER ACUTE NEUROLOGIC EMERGENCY Labs Review Labs Reviewed  BASIC METABOLIC PANEL - Abnormal;  Notable for the following:    Glucose, Bld 128 (*)    GFR calc non Af Amer 78 (*)    All other components within normal limits  CBC WITH DIFFERENTIAL    Imaging Review Ct Head Wo Contrast  09/14/2013   CLINICAL DATA:  Headache and hypertension.  EXAM: CT HEAD WITHOUT CONTRAST  TECHNIQUE: Contiguous axial images were obtained from the base of the skull through the vertex without intravenous contrast.  COMPARISON:  None.  FINDINGS: The brain demonstrates no evidence of hemorrhage, infarction, edema, mass effect, extra-axial fluid collection, hydrocephalus or mass lesion. The skull is unremarkable.  IMPRESSION: Normal head CT.   Electronically Signed   By: Irish LackGlenn  Yamagata M.D.   On: 09/14/2013 07:45      MDM   Final diagnoses:  Nonintractable episodic headache, unspecified headache type  Essential hypertension    Nursing notes including past medical history and social history reviewed and considered in documentation Labs/vital reviewed and considered     Joya Gaskinsonald W Madhav Mohon, MD 09/14/13 [phone]

## 2013-09-14 NOTE — Discharge Instructions (Signed)

## 2013-09-14 NOTE — ED Notes (Signed)
Patient c/o intermittent for the past three days, no meds taken

## 2013-09-26 ENCOUNTER — Ambulatory Visit (INDEPENDENT_AMBULATORY_CARE_PROVIDER_SITE_OTHER): Payer: Medicare Other | Admitting: Physician Assistant

## 2013-09-26 ENCOUNTER — Other Ambulatory Visit: Payer: Self-pay | Admitting: Physician Assistant

## 2013-09-26 ENCOUNTER — Ambulatory Visit (HOSPITAL_BASED_OUTPATIENT_CLINIC_OR_DEPARTMENT_OTHER)
Admission: RE | Admit: 2013-09-26 | Discharge: 2013-09-26 | Disposition: A | Discharge status: Home / Self Care | Payer: Medicare Other | Source: Ambulatory Visit | Attending: Physician Assistant | Admitting: Physician Assistant

## 2013-09-26 ENCOUNTER — Encounter: Payer: Self-pay | Admitting: Physician Assistant

## 2013-09-26 VITALS — BP 170/112 | HR 57 | Temp 98.2°F | Resp 16 | Ht 69.5 in | Wt 216.5 lb

## 2013-09-26 DIAGNOSIS — M47817 Spondylosis without myelopathy or radiculopathy, lumbosacral region: Secondary | ICD-10-CM | POA: Insufficient documentation

## 2013-09-26 DIAGNOSIS — G8929 Other chronic pain: Secondary | ICD-10-CM

## 2013-09-26 DIAGNOSIS — M545 Low back pain, unspecified: Secondary | ICD-10-CM

## 2013-09-26 DIAGNOSIS — I10 Essential (primary) hypertension: Secondary | ICD-10-CM

## 2013-09-26 DIAGNOSIS — M47816 Spondylosis without myelopathy or radiculopathy, lumbar region: Secondary | ICD-10-CM | POA: Insufficient documentation

## 2013-09-26 MED ORDER — LISINOPRIL-HYDROCHLOROTHIAZIDE 10-12.5 MG PO TABS: 1.0000 | ORAL_TABLET | Freq: Every day | ORAL | Status: DC

## 2013-09-26 NOTE — Progress Notes (Signed)
Pre visit review using our clinic review tool, if applicable. No additional management support is needed unless otherwise documented below in the visit note/SLS  

## 2013-09-26 NOTE — Progress Notes (Signed)
Patient presents to clinic today to establish care.  Acute Concerns: Patient complains of intermittent low back pain without radiation, first noted several years ago. Patient endorses occasional flareups. Endorses a daily morning stiffness lasting less than 30 minutes. Denies history of trauma or injury. Has never had imaging.  Chronic Issues: Hypertension -- Uncontrolled at present.  Patient recently seen in ER for headaches.  CT negative.  SBP elevated in 200s.   Denies chest pain, palpitations, lightheadedness, dizziness or blurry vision.  Endorses lisinopril daily.  Is currently on 10 mg.  Has never had EKG.  Health Maintenance: Dental -- overdue Vision -- overdue Immunizations -- Tetanus 2013. Colonoscopy -- Overdue.  Declines Colonoscopy.  IFOB will be given.  Past Medical History  Diagnosis Date  . Hypertension     Past Surgical History  Procedure Laterality Date  . Hydrocele excision / repair    . Kidney stone surgery  2013  . Tooth extraction      Full Dentures    No current outpatient prescriptions on file prior to visit.   No current facility-administered medications on file prior to visit.    No Known Allergies  Family History  Problem Relation Age of Onset  . Hypertension Mother     Living  . Arthritis Mother   . Stroke Father 65    Deceased  . Heart disease Sister     #1  . Hypertension Sister     #5  . Diabetes Brother     Deceased  . Stroke Brother     #2  . Alcohol abuse Brother     #2  . Alcohol abuse Father   . Throat cancer Brother     #3  . Arthritis/Rheumatoid Daughter   . Healthy Daughter     #2  . Alcohol abuse Son     History   Social History  . Marital Status: Married    Spouse Name: N/A    Number of Children: N/A  . Years of Education: N/A   Occupational History  . Not on file.   Social History Main Topics  . Smoking status: Never Smoker   . Smokeless tobacco: Never Used  . Alcohol Use: No  . Drug Use: No  . Sexual  Activity: Not on file   Other Topics Concern  . Not on file   Social History Narrative  . No narrative on file   ROS See history of present illness. All other review of systems are negative.  BP 170/112  Pulse 57  Temp(Src) 98.2 F (36.8 C) (Oral)  Resp 16  Ht 5' 9.5" (1.765 m)  Wt 216 lb 8 oz (98.204 kg)  BMI 31.52 kg/m2  SpO2 97%  Physical Exam  Vitals reviewed. Constitutional: He is oriented to person, place, and time and well-developed, well-nourished, and in no distress.  HENT:  Head: Normocephalic and atraumatic.  Right Ear: External ear normal.  Left Ear: External ear normal.  Nose: Nose normal.  Mouth/Throat: Oropharynx is clear and moist. No oropharyngeal exudate.  Tympanic membranes within normal limits bilaterally.  Eyes: Conjunctivae are normal. Pupils are equal, round, and reactive to light.  Neck: Neck supple.  Cardiovascular: Normal rate, regular rhythm, normal heart sounds and intact distal pulses.   Pulmonary/Chest: Breath sounds normal. No respiratory distress. He has no wheezes. He has no rales. He exhibits no tenderness.  Musculoskeletal:       Cervical back: Normal.       Thoracic back: Normal.  Lumbar back: Normal.  Lymphadenopathy:    He has no cervical adenopathy.  Neurological: He is alert and oriented to person, place, and time.  Skin: Skin is warm and dry. No rash noted.  Psychiatric: Affect normal.    Recent Results (from the past 2160 hour(s))  BASIC METABOLIC PANEL     Status: Abnormal   Collection Time    09/14/13  7:45 AM      Result Value Ref Range   Sodium 142  137 - 147 mEq/L   Potassium 4.2  3.7 - 5.3 mEq/L   Chloride 103  96 - 112 mEq/L   CO2 28  19 - 32 mEq/L   Glucose, Bld 128 (*) 70 - 99 mg/dL   BUN 10  6 - 23 mg/dL   Creatinine, Ser 1.00  0.50 - 1.35 mg/dL   Calcium 10.0  8.4 - 10.5 mg/dL   GFR calc non Af Amer 78 (*) >90 mL/min   GFR calc Af Amer >90  >90 mL/min   Comment: (NOTE)     The eGFR has been  calculated using the CKD EPI equation.     This calculation has not been validated in all clinical situations.     eGFR's persistently <90 mL/min signify possible Chronic Kidney     Disease.   Anion gap 11  5 - 15  CBC WITH DIFFERENTIAL     Status: None   Collection Time    09/14/13  7:45 AM      Result Value Ref Range   WBC 5.1  4.0 - 10.5 K/uL   RBC 5.40  4.22 - 5.81 MIL/uL   Hemoglobin 16.2  13.0 - 17.0 g/dL   HCT 46.0  39.0 - 52.0 %   MCV 85.2  78.0 - 100.0 fL   MCH 30.0  26.0 - 34.0 pg   MCHC 35.2  30.0 - 36.0 g/dL   RDW 13.4  11.5 - 15.5 %   Platelets 168  150 - 400 K/uL   Neutrophils Relative % 47  43 - 77 %   Neutro Abs 2.4  1.7 - 7.7 K/uL   Lymphocytes Relative 38  12 - 46 %   Lymphs Abs 2.0  0.7 - 4.0 K/uL   Monocytes Relative 12  3 - 12 %   Monocytes Absolute 0.6  0.1 - 1.0 K/uL   Eosinophils Relative 2  0 - 5 %   Eosinophils Absolute 0.1  0.0 - 0.7 K/uL   Basophils Relative 0  0 - 1 %   Basophils Absolute 0.0  0.0 - 0.1 K/uL    Assessment/Plan: Chronic low back pain Likely osteoarthritis. Will obtain x-ray of lumbar spine. Tylenol or Aleve for pain. Encouraged topical Aspercreme and heating pad during flareups. Avoid heavy lifting and overexertion.  Essential hypertension, benign We'll begin lisinopril-hydrochlorothiazide 10-12.5 daily. DASH diet handout given.  EKG reveals sinus bradycardia. Followup in 3-4 weeks for blood pressure recheck. [place] Medicare annual wellness visit at that time.

## 2013-09-26 NOTE — Patient Instructions (Addendum)
Please stop the Lisinopril 10 mg tablet.  Start the new prescription for Lisionpril-HCTZ 10-12.5 mg tablet taking 1 tablet once daily.  Follow-up with me in 3-4 weeks.  I recommend you schedule a Medicare Wellness Visit at that time.  Come fasting for blood work.  Try the Alli that you can get over-the-counter to help with weight loss.  STOP the supplement you are currently taking as it has large amounts of caffeine.    DASH Eating Plan DASH stands for "Dietary Approaches to Stop Hypertension." The DASH eating plan is a healthy eating plan that has been shown to reduce high blood pressure (hypertension). Additional health benefits may include reducing the risk of type 2 diabetes mellitus, heart disease, and stroke. The DASH eating plan may also help with weight loss. WHAT DO I NEED TO KNOW ABOUT THE DASH EATING PLAN? For the DASH eating plan, you will follow these general guidelines:  Choose foods with a percent daily value for sodium of less than 5% (as listed on the food label).  Use salt-free seasonings or herbs instead of table salt or sea salt.  Check with your health care provider or pharmacist before using salt substitutes.  Eat lower-sodium products, often labeled as "lower sodium" or "no salt added."  Eat fresh foods.  Eat more vegetables, fruits, and low-fat dairy products.  Choose whole grains. Look for the word "whole" as the first word in the ingredient list.  Choose fish and skinless chicken or [place]key more often than red meat. Limit fish, poultry, and meat to 6 oz (170 g) each day.  Limit sweets, desserts, sugars, and sugary drinks.  Choose heart-healthy fats.  Limit cheese to 1 oz (28 g) per day.  Eat more home-cooked food and less restaurant, buffet, and fast food.  Limit fried foods.  Cook foods using methods other than frying.  Limit canned vegetables. If you do use them, rinse them well to decrease the sodium.  When eating at a restaurant, ask that your food be  prepared with less salt, or no salt if possible. WHAT FOODS CAN I EAT? Seek help from a dietitian for individual calorie needs. Grains Whole grain or whole wheat bread. Brown rice. Whole grain or whole wheat pasta. Quinoa, bulgur, and whole grain cereals. Low-sodium cereals. Corn or whole wheat flour tortillas. Whole grain cornbread. Whole grain crackers. Low-sodium crackers. Vegetables Fresh or frozen vegetables (raw, steamed, roasted, or grilled). Low-sodium or reduced-sodium tomato and vegetable juices. Low-sodium or reduced-sodium tomato sauce and paste. Low-sodium or reduced-sodium canned vegetables.  Fruits All fresh, canned (in natural juice), or frozen fruits. Meat and Other Protein Products Ground beef (85% or leaner), grass-fed beef, or beef trimmed of fat. Skinless chicken or [place]key. Ground chicken or [place]key. Pork trimmed of fat. All fish and seafood. Eggs. Dried beans, peas, or lentils. Unsalted nuts and seeds. Unsalted canned beans. Dairy Low-fat dairy products, such as skim or 1% milk, 2% or reduced-fat cheeses, low-fat ricotta or cottage cheese, or plain low-fat yogurt. Low-sodium or reduced-sodium cheeses. Fats and Oils Tub margarines without trans fats. Light or reduced-fat mayonnaise and salad dressings (reduced sodium). Avocado. Safflower, olive, or canola oils. Natural peanut or almond butter. Other Unsalted popcorn and pretzels. The items listed above may not be a complete list of recommended foods or beverages. Contact your dietitian for more options. WHAT FOODS ARE NOT RECOMMENDED? Grains White bread. White pasta. White rice. Refined cornbread. Bagels and croissants. Crackers that contain trans fat. Vegetables Creamed or fried vegetables.  Vegetables in a cheese sauce. Regular canned vegetables. Regular canned tomato sauce and paste. Regular tomato and vegetable juices. Fruits Dried fruits. Canned fruit in light or heavy syrup. Fruit juice. Meat and Other Protein  Products Fatty cuts of meat. Ribs, chicken wings, bacon, sausage, bologna, salami, chitterlings, fatback, hot dogs, bratwurst, and packaged luncheon meats. Salted nuts and seeds. Canned beans with salt. Dairy Whole or 2% milk, cream, half-and-half, and cream cheese. Whole-fat or sweetened yogurt. Full-fat cheeses or blue cheese. Nondairy creamers and whipped toppings. Processed cheese, cheese spreads, or cheese curds. Condiments Onion and garlic salt, seasoned salt, table salt, and sea salt. Canned and packaged gravies. Worcestershire sauce. Tartar sauce. Barbecue sauce. Teriyaki sauce. Soy sauce, including reduced sodium. Steak sauce. Fish sauce. Oyster sauce. Cocktail sauce. Horseradish. Ketchup and mustard. Meat flavorings and tenderizers. Bouillon cubes. Hot sauce. Tabasco sauce. Marinades. Taco seasonings. Relishes. Fats and Oils Butter, stick margarine, lard, shortening, ghee, and bacon fat. Coconut, palm kernel, or palm oils. Regular salad dressings. Other Pickles and olives. Salted popcorn and pretzels. The items listed above may not be a complete list of foods and beverages to avoid. Contact your dietitian for more information. WHERE CAN I FIND MORE INFORMATION? National Heart, Lung, and Blood Institute: travelstabloid.com Document Released: 01/13/2011 Document Revised: 06/10/2013 Document Reviewed: 11/28/2012 [facility] Patient Information 2015 [place], [place]. This information is not intended to replace advice given to you by your health care provider. Make sure you discuss any questions you have with your health care provider.

## 2013-09-30 DIAGNOSIS — I10 Essential (primary) hypertension: Secondary | ICD-10-CM | POA: Insufficient documentation

## 2013-09-30 DIAGNOSIS — G8929 Other chronic pain: Secondary | ICD-10-CM | POA: Insufficient documentation

## 2013-09-30 DIAGNOSIS — M545 Low back pain: Secondary | ICD-10-CM

## 2013-09-30 NOTE — Assessment & Plan Note (Signed)
Likely osteoarthritis. Will obtain x-ray of lumbar spine. Tylenol or Aleve for pain. Encouraged topical Aspercreme and heating pad during flareups. Avoid heavy lifting and overexertion.

## 2013-09-30 NOTE — Assessment & Plan Note (Addendum)
We'll begin lisinopril-hydrochlorothiazide 10-12.5 daily. DASH diet handout given.  EKG reveals sinus bradycardia. Followup in 3-4 weeks for blood pressure recheck. [employer] Medicare annual wellness visit at that time.

## 2013-10-02 ENCOUNTER — Other Ambulatory Visit: Payer: Medicare Other

## 2013-11-13 ENCOUNTER — Other Ambulatory Visit: Payer: Medicare Other

## 2013-11-20 ENCOUNTER — Other Ambulatory Visit: Payer: Medicare Other

## 2013-11-20 ENCOUNTER — Other Ambulatory Visit (INDEPENDENT_AMBULATORY_CARE_PROVIDER_SITE_OTHER): Payer: Medicare Other

## 2013-11-20 DIAGNOSIS — Z0000 Encounter for general adult medical examination without abnormal findings: Secondary | ICD-10-CM | POA: Diagnosis not present

## 2013-11-20 LAB — FECAL OCCULT BLOOD, IMMUNOCHEMICAL: FECAL OCCULT BLD: NEGATIVE

## 2013-11-25 ENCOUNTER — Other Ambulatory Visit: Payer: Self-pay | Admitting: Physician Assistant

## 2013-11-25 NOTE — Telephone Encounter (Signed)
Left message for patient to return my call.

## 2013-11-25 NOTE — Telephone Encounter (Signed)
Rx request to pharmacy/SLS Requested drug refills are authorized, however, the patient needs further evaluation and/or laboratory testing before further refills are given. Ask him to make an appointment for this.   Please call patient and schedule Medicare Wellness w/fasting labs appointment [45 min] for future refills per provider/SLS Thanks.

## 2013-11-26 NOTE — Telephone Encounter (Signed)
Left message for patient to return my call.

## 2013-11-27 NOTE — Telephone Encounter (Signed)
Left message for patient to return my call.

## 2013-12-04 NOTE — Telephone Encounter (Signed)
Apt scheduled for 12/09/13.     KP

## 2013-12-09 ENCOUNTER — Encounter: Payer: Self-pay | Admitting: Physician Assistant

## 2013-12-09 ENCOUNTER — Ambulatory Visit (INDEPENDENT_AMBULATORY_CARE_PROVIDER_SITE_OTHER): Payer: Medicare Other | Admitting: Physician Assistant

## 2013-12-09 VITALS — BP 154/97 | HR 65 | Temp 98.4°F | Resp 16 | Ht 69.5 in | Wt 221.0 lb

## 2013-12-09 DIAGNOSIS — Z136 Encounter for screening for cardiovascular disorders: Secondary | ICD-10-CM

## 2013-12-09 DIAGNOSIS — Z0000 Encounter for general adult medical examination without abnormal findings: Secondary | ICD-10-CM

## 2013-12-09 DIAGNOSIS — I10 Essential (primary) hypertension: Secondary | ICD-10-CM | POA: Diagnosis not present

## 2013-12-09 DIAGNOSIS — Z131 Encounter for screening for diabetes mellitus: Secondary | ICD-10-CM

## 2013-12-09 DIAGNOSIS — Z23 Encounter for immunization: Secondary | ICD-10-CM

## 2013-12-09 DIAGNOSIS — M5416 Radiculopathy, lumbar region: Secondary | ICD-10-CM

## 2013-12-09 DIAGNOSIS — Z125 Encounter for screening for malignant neoplasm of prostate: Secondary | ICD-10-CM

## 2013-12-09 DIAGNOSIS — Z79899 Other long term (current) drug therapy: Secondary | ICD-10-CM

## 2013-12-09 MED ORDER — LISINOPRIL-HYDROCHLOROTHIAZIDE 20-12.5 MG PO TABS: 1.0000 | ORAL_TABLET | Freq: Every day | ORAL | Status: DC

## 2013-12-09 NOTE — Progress Notes (Signed)
Subjective:    Maxwell Hanson is a 63 y.o. male who presents for a welcome to Medicare exam.   Cardiac risk factors: advanced age (older than 6455 for men, 1865 for women), hypertension, male gender and sedentary lifestyle and obesity.  Depression Screen (Note: if answer to either of the following is "Yes", a more complete depression screening is indicated)  Q1: Over the past two weeks, have you felt down, depressed or hopeless? no Q2: Over the past two weeks, have you felt little interest or pleasure in doing things? no  Activities of Daily Living In your present state of health, do you have any difficulty performing the following activities?:  Preparing food and eating?: No Bathing yourself: No Getting dressed: No Using the toilet:No Moving around from place to place: No In the past year have you fallen or had a near fall?:No  Current exercise habits: Home exercise routine includes bicycling several times per week.  Dietary issues discussed: Endorses well-balanced diet.  Body mass index is 32.18 kg/(m^2). Hearing difficulties: No Safe in current home environment: yes  The following portions of the patient's history were reviewed and updated as appropriate: allergies, current medications, past family history, past medical history, past social history, past surgical history and problem list. Review of Systems A comprehensive review of systems was negative.    Objective:     Vision by Snellen chart: right eye:20/20, left eye:20/40 Blood pressure 154/97, pulse 65, temperature 98.4 F (36.9 C), temperature source Oral, resp. rate 16, height 5' 9.5" (1.765 m), weight 221 lb (100.245 kg), SpO2 100 %. Body mass index is 32.18 kg/(m^2). BP 154/97 mmHg  Pulse 65  Temp(Src) 98.4 F (36.9 C) (Oral)  Resp 16  Ht 5' 9.5" (1.765 m)  Wt 221 lb (100.245 kg)  BMI 32.18 kg/m2  SpO2 100%  General Appearance:    Alert, cooperative, no distress, appears stated age  Head:    Normocephalic,  without obvious abnormality, atraumatic  Eyes:    PERRL, conjunctiva/corneas clear, EOM's intact, fundi    benign, both eyes       Ears:    Normal TM's and external ear canals, both ears  Nose:   Nares normal, septum midline, mucosa normal, no drainage    or sinus tenderness  Throat:   Lips, mucosa, and tongue normal; teeth and gums normal  Neck:   Supple, symmetrical, trachea midline, no adenopathy;       thyroid:  No enlargement/tenderness/nodules; no carotid   bruit or JVD  Back:     Symmetric, no curvature, ROM normal, no CVA tenderness  Lungs:     Clear to auscultation bilaterally, respirations unlabored  Chest wall:    No tenderness or deformity  Heart:    Regular rate and rhythm, S1 and S2 normal, no murmur, rub   or gallop  Abdomen:     Soft, non-tender, bowel sounds active all four quadrants,    no masses, no organomegaly  Genitalia:    Normal male without lesion, discharge or tenderness  Rectal:    Normal tone, normal prostate, no masses or tenderness;   guaiac negative stool  Extremities:   Extremities normal, atraumatic, no cyanosis or edema  Pulses:   2+ and symmetric all extremities  Skin:   Skin color, texture, turgor normal, no rashes or lesions  Lymph nodes:   Cervical, supraclavicular, and axillary nodes normal  Neurologic:   CNII-XII intact. Normal strength, sensation and reflexes      throughout  Assessment:    (1) Welcome to Medicare Exam  (2) Hypertension  (3) Screening for Ischemic Heart Disease  (4) Prostate Cancer Screening  (5) Obesity    Plan:    (1) During the course of the visit the patient was educated and counseled about appropriate screening and preventive services including:    Prostate cancer screening  Colorectal cancer screening  Diabetes screening  Nutrition counseling   Will obtain fasting labs today.  (2) Will increase Lisinopril-HCTZ to 20-12.5 mg daily.  DASH diet encouraged.  ASA 81 mg daily.  Follow-up in       2-4  weeks.  (3) EKG obtained at last visit.  Will obtain Lipid Panel today.  Start 81 mg ASA daily.  Continue medications for hypertension.   (4) Will obtain PSA today  (5) Discussed calorie-restriction diet and aerobic exercise.  Will monitor BMI. Patient Instructions (the written plan) was given to the patient.

## 2013-12-09 NOTE — Patient Instructions (Signed)
Please begin new dose of the Lisinopril-HCTZ daily as directed.  Begin a baby Aspirin (81 mg ) daily. Read information below on the DASH diet.   For Numbness of lower extremity, you will be called for an MRI.  Once this has resulted we will know more of what we are dealing with.  I am also setting you up with a neurosurgeon for further assessment.  You will get a phone call.  Follow-up with me in 2 weeks.  We will get your EKG at that time.  Schedule an appointment for labs.  Come fasting to that appointment.  DASH Eating Plan DASH stands for "Dietary Approaches to Stop Hypertension." The DASH eating plan is a healthy eating plan that has been shown to reduce high blood pressure (hypertension). Additional health benefits may include reducing the risk of type 2 diabetes mellitus, heart disease, and stroke. The DASH eating plan may also help with weight loss. WHAT DO I NEED TO KNOW ABOUT THE DASH EATING PLAN? For the DASH eating plan, you will follow these general guidelines:  Choose foods with a percent daily value for sodium of less than 5% (as listed on the food label).  Use salt-free seasonings or herbs instead of table salt or sea salt.  Check with your health care provider or pharmacist before using salt substitutes.  Eat lower-sodium products, often labeled as "lower sodium" or "no salt added."  Eat fresh foods.  Eat more vegetables, fruits, and low-fat dairy products.  Choose whole grains. Look for the word "whole" as the first word in the ingredient list.  Choose fish and skinless chicken or [place]key more often than red meat. Limit fish, poultry, and meat to 6 oz (170 g) each day.  Limit sweets, desserts, sugars, and sugary drinks.  Choose heart-healthy fats.  Limit cheese to 1 oz (28 g) per day.  Eat more home-cooked food and less restaurant, buffet, and fast food.  Limit fried foods.  Cook foods using methods other than frying.  Limit canned vegetables. If you do use  them, rinse them well to decrease the sodium.  When eating at a restaurant, ask that your food be prepared with less salt, or no salt if possible. WHAT FOODS CAN I EAT? Seek help from a dietitian for individual calorie needs. Grains Whole grain or whole wheat bread. Brown rice. Whole grain or whole wheat pasta. Quinoa, bulgur, and whole grain cereals. Low-sodium cereals. Corn or whole wheat flour tortillas. Whole grain cornbread. Whole grain crackers. Low-sodium crackers. Vegetables Fresh or frozen vegetables (raw, steamed, roasted, or grilled). Low-sodium or reduced-sodium tomato and vegetable juices. Low-sodium or reduced-sodium tomato sauce and paste. Low-sodium or reduced-sodium canned vegetables.  Fruits All fresh, canned (in natural juice), or frozen fruits. Meat and Other Protein Products Ground beef (85% or leaner), grass-fed beef, or beef trimmed of fat. Skinless chicken or [place]key. Ground chicken or [place]key. Pork trimmed of fat. All fish and seafood. Eggs. Dried beans, peas, or lentils. Unsalted nuts and seeds. Unsalted canned beans. Dairy Low-fat dairy products, such as skim or 1% milk, 2% or reduced-fat cheeses, low-fat ricotta or cottage cheese, or plain low-fat yogurt. Low-sodium or reduced-sodium cheeses. Fats and Oils Tub margarines without trans fats. Light or reduced-fat mayonnaise and salad dressings (reduced sodium). Avocado. Safflower, olive, or canola oils. Natural peanut or almond butter. Other Unsalted popcorn and pretzels. The items listed above may not be a complete list of recommended foods or beverages. Contact your dietitian for more options. WHAT FOODS  ARE NOT RECOMMENDED? Grains White bread. White pasta. White rice. Refined cornbread. Bagels and croissants. Crackers that contain trans fat. Vegetables Creamed or fried vegetables. Vegetables in a cheese sauce. Regular canned vegetables. Regular canned tomato sauce and paste. Regular tomato and vegetable  juices. Fruits Dried fruits. Canned fruit in light or heavy syrup. Fruit juice. Meat and Other Protein Products Fatty cuts of meat. Ribs, chicken wings, bacon, sausage, bologna, salami, chitterlings, fatback, hot dogs, bratwurst, and packaged luncheon meats. Salted nuts and seeds. Canned beans with salt. Dairy Whole or 2% milk, cream, half-and-half, and cream cheese. Whole-fat or sweetened yogurt. Full-fat cheeses or blue cheese. Nondairy creamers and whipped toppings. Processed cheese, cheese spreads, or cheese curds. Condiments Onion and garlic salt, seasoned salt, table salt, and sea salt. Canned and packaged gravies. Worcestershire sauce. Tartar sauce. Barbecue sauce. Teriyaki sauce. Soy sauce, including reduced sodium. Steak sauce. Fish sauce. Oyster sauce. Cocktail sauce. Horseradish. Ketchup and mustard. Meat flavorings and tenderizers. Bouillon cubes. Hot sauce. Tabasco sauce. Marinades. Taco seasonings. Relishes. Fats and Oils Butter, stick margarine, lard, shortening, ghee, and bacon fat. Coconut, palm kernel, or palm oils. Regular salad dressings. Other Pickles and olives. Salted popcorn and pretzels. The items listed above may not be a complete list of foods and beverages to avoid. Contact your dietitian for more information. WHERE CAN I FIND MORE INFORMATION? National Heart, Lung, and Blood Institute: CablePromo.itwww.nhlbi.nih.gov/health/health-topics/topics/dash/ Document Released: 01/13/2011 Document Revised: 06/10/2013 Document Reviewed: 11/28/2012 [facility] Patient Information 2015 [place], [place]. This information is not intended to replace advice given to you by your health care provider. Make sure you discuss any questions you have with your health care provider.

## 2013-12-09 NOTE — Progress Notes (Signed)
Pre visit review using our clinic review tool, if applicable. No additional management support is needed unless otherwise documented below in the visit note/SLS  

## 2013-12-11 DIAGNOSIS — Z0000 Encounter for general adult medical examination without abnormal findings: Secondary | ICD-10-CM | POA: Insufficient documentation

## 2013-12-11 DIAGNOSIS — Z125 Encounter for screening for malignant neoplasm of prostate: Secondary | ICD-10-CM | POA: Insufficient documentation

## 2013-12-14 ENCOUNTER — Ambulatory Visit (HOSPITAL_BASED_OUTPATIENT_CLINIC_OR_DEPARTMENT_OTHER)
Admission: RE | Admit: 2013-12-14 | Discharge: 2013-12-14 | Disposition: A | Discharge status: Home / Self Care | Payer: Medicare Other | Source: Ambulatory Visit | Attending: Physician Assistant | Admitting: Physician Assistant

## 2013-12-14 DIAGNOSIS — M5416 Radiculopathy, lumbar region: Secondary | ICD-10-CM | POA: Insufficient documentation

## 2013-12-17 ENCOUNTER — Other Ambulatory Visit: Payer: Medicare Other

## 2013-12-18 ENCOUNTER — Other Ambulatory Visit (INDEPENDENT_AMBULATORY_CARE_PROVIDER_SITE_OTHER): Payer: Medicare Other

## 2013-12-18 ENCOUNTER — Telehealth: Payer: Self-pay | Admitting: Physician Assistant

## 2013-12-18 DIAGNOSIS — E119 Type 2 diabetes mellitus without complications: Secondary | ICD-10-CM

## 2013-12-18 DIAGNOSIS — Z136 Encounter for screening for cardiovascular disorders: Secondary | ICD-10-CM

## 2013-12-18 DIAGNOSIS — E785 Hyperlipidemia, unspecified: Secondary | ICD-10-CM

## 2013-12-18 DIAGNOSIS — I10 Essential (primary) hypertension: Secondary | ICD-10-CM

## 2013-12-18 DIAGNOSIS — Z131 Encounter for screening for diabetes mellitus: Secondary | ICD-10-CM | POA: Diagnosis not present

## 2013-12-18 LAB — PSA: PSA: 0.48 ng/mL (ref 0.10–4.00)

## 2013-12-18 LAB — BASIC METABOLIC PANEL
BUN: 14 mg/dL (ref 6–23)
CO2: 23 mEq/L (ref 19–32)
Calcium: 9.8 mg/dL (ref 8.4–10.5)
Chloride: 102 mEq/L (ref 96–112)
Creatinine, Ser: 1 mg/dL (ref 0.4–1.5)
GFR: 92.56 mL/min (ref >60.00)
Glucose, Bld: 122 mg/dL — ABNORMAL HIGH (ref 70–99)
POTASSIUM: 3.7 meq/L (ref 3.5–5.1)
SODIUM: 138 meq/L (ref 135–145)

## 2013-12-18 LAB — HEPATIC FUNCTION PANEL
ALK PHOS: 41 U/L (ref 39–117)
ALT: 45 U/L (ref 0–53)
AST: 43 U/L — AB (ref 0–37)
Albumin: 3.3 g/dL — ABNORMAL LOW (ref 3.5–5.2)
BILIRUBIN TOTAL: 1.5 mg/dL — AB (ref 0.2–1.2)
Bilirubin, Direct: 0.2 mg/dL (ref 0.0–0.3)
Total Protein: 7.4 g/dL (ref 6.0–8.3)

## 2013-12-18 LAB — LIPID PANEL
CHOL/HDL RATIO: 6
Cholesterol: 282 mg/dL — ABNORMAL HIGH (ref 0–200)
HDL: 47.9 mg/dL (ref >39.00)
LDL Cholesterol: 204 mg/dL — ABNORMAL HIGH (ref 0–99)
NONHDL: 234.1
Triglycerides: 150 mg/dL — ABNORMAL HIGH (ref 0.0–149.0)
VLDL: 30 mg/dL (ref 0.0–40.0)

## 2013-12-18 LAB — HEMOGLOBIN A1C: Hgb A1c MFr Bld: 6.9 % — ABNORMAL HIGH (ref 4.6–6.5)

## 2013-12-18 MED ORDER — ATORVASTATIN CALCIUM 20 MG PO TABS: 20.0000 mg | ORAL_TABLET | Freq: Every day | ORAL | Status: DC

## 2013-12-18 NOTE — Telephone Encounter (Signed)
Lab results are in.  LDL cholesterol is significantly elevated > 200. Medication needs to be started to reduce risk of MI or CVA.  Will send in Lipitor 20 mg daily to take.  Will likely need higher dose, but we will start low and work are way up.  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  I would like him to begin a low carb, low fat diet.  Increase exercise.  Will recheck A1C in 3 months.  If it keeps creeping up we will have to begin medication.  Follow-up 1 month.

## 2013-12-20 NOTE — Telephone Encounter (Signed)
Patient informed, understood & agreed; will keep Mon 1.16.15 appt for BP & EKG/SLS

## 2013-12-23 ENCOUNTER — Ambulatory Visit (INDEPENDENT_AMBULATORY_CARE_PROVIDER_SITE_OTHER): Payer: Medicare Other | Admitting: Physician Assistant

## 2013-12-23 ENCOUNTER — Encounter: Payer: Self-pay | Admitting: Physician Assistant

## 2013-12-23 VITALS — BP 140/84 | HR 63 | Temp 98.2°F | Resp 16 | Ht 69.5 in | Wt 218.1 lb

## 2013-12-23 DIAGNOSIS — I10 Essential (primary) hypertension: Secondary | ICD-10-CM | POA: Diagnosis not present

## 2013-12-23 DIAGNOSIS — R9431 Abnormal electrocardiogram [ECG] [EKG]: Secondary | ICD-10-CM | POA: Diagnosis not present

## 2013-12-23 DIAGNOSIS — Z136 Encounter for screening for cardiovascular disorders: Secondary | ICD-10-CM | POA: Diagnosis not present

## 2013-12-23 NOTE — Assessment & Plan Note (Signed)
EKG reveals some ST depression.  Patient asymptomatic. BP under better control.  Will set up for EST. Continue anti-hypertensives, aspirin and statin.  Alarm signs/symptoms discussed with patient. Follow-up as scheduled.

## 2013-12-23 NOTE — Progress Notes (Signed)
Patient presents to clinic today for 2-week follow-up of Hypertension after beginning new dose of Lisinopril-HCTZ 20-12.5 mg daily. Patient has just started new dose today.  BP improved from last check.  Denies chest pain, palpitations, lightheadedness or dizziness.   BP Readings from Last 3 Encounters:  12/23/13 140/84  12/09/13 154/97  09/26/13 170/112   Will also obtain EKG today as machines were down at patient's wellness visit 2 weeks ago.  Past Medical History  Diagnosis Date  . Hypertension     Current Outpatient Prescriptions on File Prior to Visit  Medication Sig Dispense Refill  . atorvastatin (LIPITOR) 20 MG tablet Take 1 tablet (20 mg total) by mouth daily. 90 tablet 3  . lisinopril-hydrochlorothiazide (ZESTORETIC) 20-12.5 MG per tablet Take 1 tablet by mouth daily. 30 tablet 3  . OVER THE COUNTER MEDICATION Take 1 tablet by mouth daily. AB CUTS: CLA, GARCINIA [place]BO[place]IA, GREEN COFFEE BEAN, GREEN TEA EXTRACT, CHROMIUM     No current facility-administered medications on file prior to visit.    No Known Allergies  Family History  Problem Relation Age of Onset  . Hypertension Mother     Living  . Arthritis Mother   . Stroke Father [id number]    Deceased  . Heart disease Sister     #1  . Hypertension Sister     #5  . Diabetes Brother     Deceased  . Stroke Brother     #2  . Alcohol abuse Brother     #2  . Alcohol abuse Father   . Throat cancer Brother     #3  . Arthritis/Rheumatoid Daughter   . Healthy Daughter     #2  . Alcohol abuse Son     History   Social History  . Marital Status: Married    Spouse Name: N/A    Number of Children: N/A  . Years of Education: N/A   Social History Main Topics  . Smoking status: Never Smoker   . Smokeless tobacco: Never Used  . Alcohol Use: No  . Drug Use: No  . Sexual Activity: None   Other Topics Concern  . None   Social History Narrative   Review of Systems - See HPI.  All other ROS are negative.  BP  140/84 mmHg  Pulse 63  Temp(Src) 98.2 F (36.8 C) (Oral)  Resp 16  Ht 5' 9.5" (1.765 m)  Wt 218 lb 2 oz (98.941 kg)  BMI 31.76 kg/m2  SpO2 100%  Physical Exam  Constitutional: He is oriented to person, place, and time and well-developed, well-nourished, and in no distress.  HENT:  Head: Normocephalic and atraumatic.  Eyes: Conjunctivae are normal.  Neck: Neck supple.  Cardiovascular: Normal rate, regular rhythm, normal heart sounds and intact distal pulses.   Pulmonary/Chest: Effort normal and breath sounds normal. No respiratory distress. He has no wheezes. He has no rales. He exhibits no tenderness.  Neurological: He is alert and oriented to person, place, and time.  Skin: Skin is warm and dry. No rash noted.  Vitals reviewed.  Recent Results (from the past 2160 hour(s))  Fecal occult blood, imunochemical     Status: None   Collection Time: 11/20/13  4:24 PM  Result Value Ref Range   Fecal Occult Bld Negative Negative  Basic metabolic panel     Status: Abnormal   Collection Time: 12/18/13  8:57 AM  Result Value Ref Range   Sodium 138 135 - 145 mEq/L   Potassium  3.7 3.5 - 5.1 mEq/L   Chloride 102 96 - 112 mEq/L   CO2 23 19 - 32 mEq/L   Glucose, Bld 122 (H) 70 - 99 mg/dL   BUN 14 6 - 23 mg/dL   Creatinine, Ser 1.0 0.4 - 1.5 mg/dL   Calcium 9.8 8.4 - 25.310.5 mg/dL   GFR 66.4492.56 >03.47>60.00 mL/min  Hepatic function panel     Status: Abnormal   Collection Time: 12/18/13  8:57 AM  Result Value Ref Range   Total Bilirubin 1.5 (H) 0.2 - 1.2 mg/dL   Bilirubin, Direct 0.2 0.0 - 0.3 mg/dL   Alkaline Phosphatase 41 39 - 117 U/L   AST 43 (H) 0 - 37 U/L   ALT 45 0 - 53 U/L   Total Protein 7.4 6.0 - 8.3 g/dL   Albumin 3.3 (L) 3.5 - 5.2 g/dL  Hemoglobin Q2VA1c     Status: Abnormal   Collection Time: 12/18/13  8:57 AM  Result Value Ref Range   Hgb A1c MFr Bld 6.9 (H) 4.6 - 6.5 %    Comment: Glycemic Control Guidelines for People with Diabetes:Non Diabetic:  <6%Goal of Therapy: <7%Additional  Action Suggested:  >8%   Lipid panel     Status: Abnormal   Collection Time: 12/18/13  8:57 AM  Result Value Ref Range   Cholesterol 282 (H) 0 - 200 mg/dL    Comment: ATP III Classification       Desirable:  < 200 mg/dL               Borderline High:  200 - 239 mg/dL          High:  > = 956240 mg/dL   Triglycerides 387.5150.0 (H) 0.0 - 149.0 mg/dL    Comment: Normal:  <643<150 mg/dLBorderline High:  150 - 199 mg/dL   HDL 32.9547.90 >18.84>39.00 mg/dL   VLDL 16.630.0 0.0 - 06.340.0 mg/dL   LDL Cholesterol 016204 (H) 0 - 99 mg/dL   Total CHOL/HDL Ratio 6     Comment:                Men          Women1/2 Average Risk     3.4          3.3Average Risk          5.0          4.42X Average Risk          9.6          7.13X Average Risk          15.0          11.0                       NonHDL 234.10     Comment: NOTE:  Non-HDL goal should be 30 mg/dL higher than patient's LDL goal (i.e. LDL goal of < 70 mg/dL, would have non-HDL goal of < 100 mg/dL)  PSA     Status: None   Collection Time: 12/18/13  8:57 AM  Result Value Ref Range   PSA 0.48 0.10 - 4.00 ng/mL   Assessment/Plan: Essential hypertension, benign Improving. Continue current regimen and baby aspirin daily.  Continue DASH diet.  Follow-up in 1 month.  Screening for ischemic heart disease EKG reveals some ST depression.  Patient asymptomatic. BP under better control.  Will set up for EST. Continue anti-hypertensives, aspirin and statin.  Alarm signs/symptoms discussed with patient.  Follow-up as scheduled.

## 2013-12-23 NOTE — Progress Notes (Signed)
Pre visit review using our clinic review tool, if applicable. No additional management support is needed unless otherwise documented below in the visit note/SLS  

## 2013-12-23 NOTE — Assessment & Plan Note (Signed)
Improving. Continue current regimen and baby aspirin daily.  Continue DASH diet.  Follow-up in 1 month.

## 2013-12-23 NOTE — Patient Instructions (Signed)
Please take new dose of medication daily as directed.  Continue Lipitor and Baby aspirin.  You will be contacted to set up a stress test. Please go to that appointment.   For the tenderness behind your ear.  I believe your symptoms were muscular in nature giving what you told me and the fact that your exam looks good.  Apply some topical Aspercreme behind the ear.    Follow-up in 1 month.

## 2014-01-15 ENCOUNTER — Telehealth (HOSPITAL_COMMUNITY): Payer: Self-pay

## 2014-01-15 NOTE — Telephone Encounter (Signed)
Encounter complete. 

## 2014-01-16 ENCOUNTER — Telehealth (HOSPITAL_COMMUNITY): Payer: Self-pay

## 2014-01-16 NOTE — Telephone Encounter (Signed)
Encounter complete. 

## 2014-01-17 ENCOUNTER — Ambulatory Visit (HOSPITAL_COMMUNITY)
Admission: RE | Admit: 2014-01-17 | Discharge: 2014-01-17 | Disposition: A | Discharge status: Home / Self Care | Payer: Medicare Other | Source: Ambulatory Visit | Attending: Cardiology | Admitting: Cardiology

## 2014-01-17 DIAGNOSIS — R9431 Abnormal electrocardiogram [ECG] [EKG]: Secondary | ICD-10-CM | POA: Diagnosis not present

## 2014-01-17 DIAGNOSIS — Z136 Encounter for screening for cardiovascular disorders: Secondary | ICD-10-CM

## 2014-01-17 NOTE — Procedures (Signed)
Exercise Treadmill Test Test  Exercise Tolerance Test Ordering MD: Piedad ClimesWilliam Cody Martin, PA-C    Unique Test No: 1  Treadmill:  1  Indication for ETT: ABN EKG  Contraindication to ETT: No   Stress Modality: exercise - treadmill  Cardiac Imaging Performed: non   Protocol: standard Bruce - maximal  Max BP:  176/96  Max MPHR (bpm):  157 85% MPR (bpm):  133  MPHR obtained (bpm):  166 % MPHR obtained:  105  Reached 85% MPHR (min:sec):  3:40 Total Exercise Time (min-sec):  8:30  Workload in METS:  10.1 Borg Scale: 16  Reason ETT Terminated:  leg cramps    ST Segment Analysis At Rest: NSR, inferior lateral ST depression and T wave inversion. With Exercise: borderline ST changes  Other Information Arrhythmia:  No Angina during ETT:  absent (0) Quality of ETT:  indeterminate  ETT Interpretation:  borderline (indeterminate) with non-specific ST changes  Comments: Exercise treadmill with baseline ST depression and T wave inversion in the inferior lateral leads; good exercise tolerance (8:30); normal BP response; no chest pain; increased ST depression in the inferior lateral leads felt to be nondiagnostic due to baseline changes. Suggest stress echocardiogram or nuclear imaging to better assess.  Maxwell HeightBrian CrenshawMD

## 2014-01-19 ENCOUNTER — Telehealth: Payer: Self-pay | Admitting: Physician Assistant

## 2014-01-19 DIAGNOSIS — R9439 Abnormal result of other cardiovascular function study: Secondary | ICD-10-CM

## 2014-01-19 NOTE — Telephone Encounter (Signed)
Stress test with some abnormal findings.  Cardiology recommends Stress Echo to further assess.  Patient will be called to schedule.

## 2014-01-20 ENCOUNTER — Encounter: Payer: Self-pay | Admitting: Physician Assistant

## 2014-01-20 NOTE — Telephone Encounter (Signed)
LMOM with contact name and number for return call RE: results and further provider instructions/SLS  

## 2014-01-27 NOTE — Telephone Encounter (Signed)
LMOM [x2] with contact name and number for return call RE: results and further provider; asked pt to please call and inform us that Cardiology did follow-up with him and schedule Stress Echo test/SLS

## 2014-01-30 NOTE — Telephone Encounter (Signed)
Called pt and lmovm to return call.

## 2014-02-04 ENCOUNTER — Encounter: Payer: Self-pay | Admitting: *Deleted

## 2014-02-04 NOTE — Telephone Encounter (Signed)
Please mail letter as patient has been unable to be reached.

## 2014-02-04 NOTE — Telephone Encounter (Signed)
Letter mailed to patient/SLS

## 2014-02-12 ENCOUNTER — Ambulatory Visit (HOSPITAL_BASED_OUTPATIENT_CLINIC_OR_DEPARTMENT_OTHER)
Admission: RE | Admit: 2014-02-12 | Discharge: 2014-02-12 | Disposition: A | Discharge status: Home / Self Care | Payer: Medicare Other | Source: Ambulatory Visit | Attending: Physician Assistant | Admitting: Physician Assistant

## 2014-02-12 DIAGNOSIS — I369 Nonrheumatic tricuspid valve disorder, unspecified: Secondary | ICD-10-CM | POA: Diagnosis not present

## 2014-02-12 DIAGNOSIS — R9439 Abnormal result of other cardiovascular function study: Secondary | ICD-10-CM

## 2014-02-19 ENCOUNTER — Encounter: Payer: Self-pay | Admitting: *Deleted

## 2014-02-21 ENCOUNTER — Ambulatory Visit (INDEPENDENT_AMBULATORY_CARE_PROVIDER_SITE_OTHER): Payer: Medicare Other | Admitting: Physician Assistant

## 2014-02-21 ENCOUNTER — Encounter: Payer: Self-pay | Admitting: Physician Assistant

## 2014-02-21 VITALS — BP 119/83 | HR 74 | Temp 98.7°F | Resp 16 | Ht 69.5 in | Wt 219.0 lb

## 2014-02-21 DIAGNOSIS — I519 Heart disease, unspecified: Secondary | ICD-10-CM | POA: Diagnosis not present

## 2014-02-21 DIAGNOSIS — E785 Hyperlipidemia, unspecified: Secondary | ICD-10-CM | POA: Insufficient documentation

## 2014-02-21 DIAGNOSIS — I10 Essential (primary) hypertension: Secondary | ICD-10-CM

## 2014-02-21 DIAGNOSIS — I5189 Other ill-defined heart diseases: Secondary | ICD-10-CM

## 2014-02-21 LAB — COMPREHENSIVE METABOLIC PANEL
ALT: 53 U/L (ref 0–53)
AST: 42 U/L — ABNORMAL HIGH (ref 0–37)
Albumin: 3.9 g/dL (ref 3.5–5.2)
Alkaline Phosphatase: 46 U/L (ref 39–117)
BILIRUBIN TOTAL: 1.2 mg/dL (ref 0.2–1.2)
BUN: 10 mg/dL (ref 6–23)
CO2: 31 mEq/L (ref 19–32)
Calcium: 9.7 mg/dL (ref 8.4–10.5)
Chloride: 101 mEq/L (ref 96–112)
Creatinine, Ser: 0.93 mg/dL (ref 0.40–1.50)
GFR: 105.25 mL/min (ref >60.00)
GLUCOSE: 162 mg/dL — AB (ref 70–99)
Potassium: 3.6 mEq/L (ref 3.5–5.1)
Sodium: 137 mEq/L (ref 135–145)
TOTAL PROTEIN: 7.9 g/dL (ref 6.0–8.3)

## 2014-02-21 NOTE — Assessment & Plan Note (Signed)
Patient on ACEI therapy.  Asymptomatic.  Continue current regimen.  Will monitor.

## 2014-02-21 NOTE — Patient Instructions (Signed)
Please continue medications as directed.  Stop by the lab for blood work. I will call you with your results.  Follow-up in 3 months.  Food Choices to Lower Your Triglycerides  Triglycerides are a type of fat in your blood. High levels of triglycerides can increase the risk of heart disease and stroke. If your triglyceride levels are high, the foods you eat and your eating habits are very important. Choosing the right foods can help lower your triglycerides.  WHAT GENERAL GUIDELINES DO I NEED TO FOLLOW?  Lose weight if you are overweight.   Limit or avoid alcohol.   Fill one half of your plate with vegetables and green salads.   Limit fruit to two servings a day. Choose fruit instead of juice.   Make one fourth of your plate whole grains. Look for the word "whole" as the first word in the ingredient list.  Fill one fourth of your plate with lean protein foods.  Enjoy fatty fish (such as salmon, mackerel, sardines, and tuna) three times a week.   Choose healthy fats.   Limit foods high in starch and sugar.  Eat more home-cooked food and less restaurant, buffet, and fast food.  Limit fried foods.  Cook foods using methods other than frying.  Limit saturated fats.  Check ingredient lists to avoid foods with partially hydrogenated oils (trans fats) in them. WHAT FOODS CAN I EAT?  Grains Whole grains, such as whole wheat or whole grain breads, crackers, cereals, and pasta. Unsweetened oatmeal, bulgur, barley, quinoa, or brown rice. Corn or whole wheat flour tortillas.  Vegetables Fresh or frozen vegetables (raw, steamed, roasted, or grilled). Green salads. Fruits All fresh, canned (in natural juice), or frozen fruits. Meat and Other Protein Products Ground beef (85% or leaner), grass-fed beef, or beef trimmed of fat. Skinless chicken or [place]. Ground chicken or [place]. Pork trimmed of fat. All fish and seafood. Eggs. Dried beans, peas, or lentils. Unsalted nuts or seeds.  Unsalted canned or dry beans. Dairy Low-fat dairy products, such as skim or 1% milk, 2% or reduced-fat cheeses, low-fat ricotta or cottage cheese, or plain low-fat yogurt. Fats and Oils Tub margarines without trans fats. Light or reduced-fat mayonnaise and salad dressings. Avocado. Safflower, olive, or canola oils. Natural peanut or almond butter. The items listed above may not be a complete list of recommended foods or beverages. Contact your dietitian for more options. WHAT FOODS ARE NOT RECOMMENDED?  Grains White bread. White pasta. White rice. Cornbread. Bagels, pastries, and croissants. Crackers that contain trans fat. Vegetables White potatoes. Corn. Creamed or fried vegetables. Vegetables in a cheese sauce. Fruits Dried fruits. Canned fruit in light or heavy syrup. Fruit juice. Meat and Other Protein Products Fatty cuts of meat. Ribs, chicken wings, bacon, sausage, bologna, salami, chitterlings, fatback, hot dogs, bratwurst, and packaged luncheon meats. Dairy Whole or 2% milk, cream, half-and-half, and cream cheese. Whole-fat or sweetened yogurt. Full-fat cheeses. Nondairy creamers and whipped toppings. Processed cheese, cheese spreads, or cheese curds. Sweets and Desserts Corn syrup, sugars, honey, and molasses. Candy. Jam and jelly. Syrup. Sweetened cereals. Cookies, pies, cakes, donuts, muffins, and ice cream. Fats and Oils Butter, stick margarine, lard, shortening, ghee, or bacon fat. Coconut, palm kernel, or palm oils. Beverages Alcohol. Sweetened drinks (such as sodas, lemonade, and fruit drinks or punches). The items listed above may not be a complete list of foods and beverages to avoid. Contact your dietitian for more information. Document Released: 11/12/2003 Document Revised: 01/29/2013 Document Reviewed: 11/28/2012  [employer] Patient Information 2015 [place]. This information is not intended to replace advice given to you by your health care provider. Make sure you  discuss any questions you have with your health care provider.

## 2014-02-21 NOTE — Progress Notes (Signed)
Pre visit review using our clinic review tool, if applicable. No additional management support is needed unless otherwise documented below in the visit note/SLS  

## 2014-02-21 NOTE — Progress Notes (Signed)
Patient presents to clinic today for follow-up of HTN and to discuss recent Echocardiogram results.  Patient endorses taking BP medications as directed.  Denies chest pain, palpitations, lightheadedness or dizziness.  BP normotensive in clinic.  Patient endorses taking his Lipitor as directed.  Is also taking 81 mg ASA daily.    Denies concerns at today's visit.  Past Medical History  Diagnosis Date  . Hypertension     Current Outpatient Prescriptions on File Prior to Visit  Medication Sig Dispense Refill  . atorvastatin (LIPITOR) 20 MG tablet Take 1 tablet (20 mg total) by mouth daily. 90 tablet 3  . lisinopril-hydrochlorothiazide (ZESTORETIC) 20-12.5 MG per tablet Take 1 tablet by mouth daily. 30 tablet 3  . OVER THE COUNTER MEDICATION Take 1 tablet by mouth as directed. AB CUTS: CLA, GARCINIA [place]IA, GREEN COFFEE BEAN, GREEN TEA EXTRACT, CHROMIUM     No current facility-administered medications on file prior to visit.    No Known Allergies  Family History  Problem Relation Age of Onset  . Hypertension Mother     Living  . Arthritis Mother   . Stroke Father 81    Deceased  . Heart disease Sister     #1  . Hypertension Sister     #5  . Diabetes Brother     Deceased  . Stroke Brother     #2  . Alcohol abuse Brother     #2  . Alcohol abuse Father   . Throat cancer Brother     #3  . Arthritis/Rheumatoid Daughter   . Healthy Daughter     #2  . Alcohol abuse Son     History   Social History  . Marital Status: Married    Spouse Name: N/A    Number of Children: N/A  . Years of Education: N/A   Social History Main Topics  . Smoking status: Never Smoker   . Smokeless tobacco: Never Used  . Alcohol Use: No  . Drug Use: No  . Sexual Activity: None   Other Topics Concern  . None   Social History Narrative    Review of Systems - See HPI.  All other ROS are negative.  BP 119/83 mmHg  Pulse 74  Temp(Src) 98.7 F (37.1 C) (Oral)  Resp 16  Ht 5' 9.5"  (1.765 m)  Wt 219 lb (99.338 kg)  BMI 31.89 kg/m2  SpO2 98%  Physical Exam  Constitutional: He is oriented to person, place, and time and well-developed, well-nourished, and in no distress.  HENT:  Head: Normocephalic and atraumatic.  Eyes: Conjunctivae are normal.  Neck: Neck supple.  Cardiovascular: Normal rate, regular rhythm, normal heart sounds and intact distal pulses.   Pulses:      Dorsalis pedis pulses are 2+ on the right side, and 2+ on the left side.       Posterior tibial pulses are 2+ on the right side, and 2+ on the left side.  Pulmonary/Chest: Effort normal and breath sounds normal. No respiratory distress. He has no wheezes. He has no rales. He exhibits no tenderness.  Neurological: He is alert and oriented to person, place, and time.  Skin: Skin is warm and dry. No rash noted.  Psychiatric: Affect normal.  Vitals reviewed.   Recent Results (from the past 2160 hour(s))  Basic metabolic panel     Status: Abnormal   Collection Time: 12/18/13  8:57 AM  Result Value Ref Range   Sodium 138 135 - 145 mEq/L  Potassium 3.7 3.5 - 5.1 mEq/L   Chloride 102 96 - 112 mEq/L   CO2 23 19 - 32 mEq/L   Glucose, Bld 122 (H) 70 - 99 mg/dL   BUN 14 6 - 23 mg/dL   Creatinine, Ser 1.0 0.4 - 1.5 mg/dL   Calcium 9.8 8.4 - 13.010.5 mg/dL   GFR 86.5792.56 >84.69>60.00 mL/min  Hepatic function panel     Status: Abnormal   Collection Time: 12/18/13  8:57 AM  Result Value Ref Range   Total Bilirubin 1.5 (H) 0.2 - 1.2 mg/dL   Bilirubin, Direct 0.2 0.0 - 0.3 mg/dL   Alkaline Phosphatase 41 39 - 117 U/L   AST 43 (H) 0 - 37 U/L   ALT 45 0 - 53 U/L   Total Protein 7.4 6.0 - 8.3 g/dL   Albumin 3.3 (L) 3.5 - 5.2 g/dL  Hemoglobin G2XA1c     Status: Abnormal   Collection Time: 12/18/13  8:57 AM  Result Value Ref Range   Hgb A1c MFr Bld 6.9 (H) 4.6 - 6.5 %    Comment: Glycemic Control Guidelines for People with Diabetes:Non Diabetic:  <6%Goal of Therapy: <7%Additional Action Suggested:  >8%   Lipid panel      Status: Abnormal   Collection Time: 12/18/13  8:57 AM  Result Value Ref Range   Cholesterol 282 (H) 0 - 200 mg/dL    Comment: ATP III Classification       Desirable:  < 200 mg/dL               Borderline High:  200 - 239 mg/dL          High:  > = 528240 mg/dL   Triglycerides 413.2150.0 (H) 0.0 - 149.0 mg/dL    Comment: Normal:  <440<150 mg/dLBorderline High:  150 - 199 mg/dL   HDL 10.2747.90 >25.36>39.00 mg/dL   VLDL 64.430.0 0.0 - 03.440.0 mg/dL   LDL Cholesterol 742204 (H) 0 - 99 mg/dL   Total CHOL/HDL Ratio 6     Comment:                Men          Women1/2 Average Risk     3.4          3.3Average Risk          5.0          4.42X Average Risk          9.6          7.13X Average Risk          15.0          11.0                       NonHDL 234.10     Comment: NOTE:  Non-HDL goal should be 30 mg/dL higher than patient's LDL goal (i.e. LDL goal of < 70 mg/dL, would have non-HDL goal of < 100 mg/dL)  PSA     Status: None   Collection Time: 12/18/13  8:57 AM  Result Value Ref Range   PSA 0.48 0.10 - 4.00 ng/mL    Assessment/Plan: Essential hypertension, benign Much improved.  Continue current regimen.  Will check CMP today.   Hyperlipidemia Will obtain repeat LFTS today after starting statin therapy. Continue current regimen.  TLC reiterated to patient.   Diastolic dysfunction Patient on ACEI therapy.  Asymptomatic.  Continue current regimen.  Will monitor.

## 2014-02-21 NOTE — Assessment & Plan Note (Signed)
Much improved.  Continue current regimen.  Will check CMP today.

## 2014-02-21 NOTE — Assessment & Plan Note (Signed)
Will obtain repeat LFTS today after starting statin therapy. Continue current regimen.  TLC reiterated to patient.

## 2014-03-10 ENCOUNTER — Encounter: Payer: Self-pay | Admitting: *Deleted

## 2014-04-21 ENCOUNTER — Other Ambulatory Visit: Payer: Self-pay | Admitting: Physician Assistant

## 2014-06-03 ENCOUNTER — Encounter: Payer: Self-pay | Admitting: Physician Assistant

## 2014-06-03 ENCOUNTER — Ambulatory Visit (INDEPENDENT_AMBULATORY_CARE_PROVIDER_SITE_OTHER): Payer: Self-pay | Admitting: Physician Assistant

## 2014-06-03 VITALS — BP 132/80 | HR 72 | Temp 98.3°F | Resp 16 | Ht 70.0 in | Wt 221.0 lb

## 2014-06-03 DIAGNOSIS — I10 Essential (primary) hypertension: Secondary | ICD-10-CM

## 2014-06-03 DIAGNOSIS — R6882 Decreased libido: Secondary | ICD-10-CM | POA: Insufficient documentation

## 2014-06-03 DIAGNOSIS — L280 Lichen simplex chronicus: Secondary | ICD-10-CM

## 2014-06-03 DIAGNOSIS — E785 Hyperlipidemia, unspecified: Secondary | ICD-10-CM

## 2014-06-03 DIAGNOSIS — E119 Type 2 diabetes mellitus without complications: Secondary | ICD-10-CM | POA: Insufficient documentation

## 2014-06-03 LAB — BASIC METABOLIC PANEL
BUN: 10 mg/dL (ref 6–23)
CALCIUM: 9.4 mg/dL (ref 8.4–10.5)
CO2: 31 meq/L (ref 19–32)
Chloride: 103 mEq/L (ref 96–112)
Creatinine, Ser: 0.95 mg/dL (ref 0.40–1.50)
GFR: 102.61 mL/min (ref >60.00)
GLUCOSE: 137 mg/dL — AB (ref 70–99)
Potassium: 3.6 mEq/L (ref 3.5–5.1)
Sodium: 139 mEq/L (ref 135–145)

## 2014-06-03 LAB — LIPID PANEL
CHOL/HDL RATIO: 3
CHOLESTEROL: 165 mg/dL (ref 0–200)
HDL: 54 mg/dL (ref >39.00)
LDL Cholesterol: 93 mg/dL (ref 0–99)
NONHDL: 111
Triglycerides: 92 mg/dL (ref 0.0–149.0)
VLDL: 18.4 mg/dL (ref 0.0–40.0)

## 2014-06-03 LAB — HEMOGLOBIN A1C: HEMOGLOBIN A1C: 7.3 % — AB (ref 4.6–6.5)

## 2014-06-03 LAB — TESTOSTERONE: TESTOSTERONE: 622 ng/dL (ref 300.00–890.00)

## 2014-06-03 MED ORDER — ATORVASTATIN CALCIUM 20 MG PO TABS: 20.0000 mg | ORAL_TABLET | Freq: Every day | ORAL | Status: DC

## 2014-06-03 MED ORDER — LISINOPRIL-HYDROCHLOROTHIAZIDE 20-12.5 MG PO TABS: 1.0000 | ORAL_TABLET | Freq: Every day | ORAL | Status: DC

## 2014-06-03 MED ORDER — TRIAMCINOLONE ACETONIDE 0.1 % EX CREA: 1.0000 "application " | TOPICAL_CREAM | Freq: Two times a day (BID) | CUTANEOUS | Status: DC

## 2014-06-03 NOTE — Assessment & Plan Note (Signed)
Without sexual dysfunction.  Supportive measures discussed.  Will check testosterone level today.

## 2014-06-03 NOTE — Patient Instructions (Signed)
Please continue medications as directed. I will call you with your results. If all looks good, we will follow-up in 6 months.  Diabetes and Foot Care Diabetes may cause you to have problems because of poor blood supply (circulation) to your feet and legs. This may cause the skin on your feet to become thinner, break easier, and heal more slowly. Your skin may become dry, and the skin may peel and crack. You may also have nerve damage in your legs and feet causing decreased feeling in them. You may not notice minor injuries to your feet that could lead to infections or more serious problems. Taking care of your feet is one of the most important things you can do for yourself.  HOME CARE INSTRUCTIONS  Wear shoes at all times, even in the house. Do not go barefoot. Bare feet are easily injured.  Check your feet daily for blisters, cuts, and redness. If you cannot see the bottom of your feet, use a mirror or ask someone for help.  Wash your feet with warm water (do not use hot water) and mild soap. Then pat your feet and the areas between your toes until they are completely dry. Do not soak your feet as this can dry your skin.  Apply a moisturizing lotion or petroleum jelly (that does not contain alcohol and is unscented) to the skin on your feet and to dry, brittle toenails. Do not apply lotion between your toes.  Trim your toenails straight across. Do not dig under them or around the cuticle. File the edges of your nails with an emery board or nail file.  Do not cut corns or calluses or try to remove them with medicine.  Wear clean socks or stockings every day. Make sure they are not too tight. Do not wear knee-high stockings since they may decrease blood flow to your legs.  Wear shoes that fit properly and have enough cushioning. To break in new shoes, wear them for just a few hours a day. This prevents you from injuring your feet. Always look in your shoes before you put them on to be sure  there are no objects inside.  Do not cross your legs. This may decrease the blood flow to your feet.  If you find a minor scrape, cut, or break in the skin on your feet, keep it and the skin around it clean and dry. These areas may be cleansed with mild soap and water. Do not cleanse the area with peroxide, alcohol, or iodine.  When you remove an adhesive bandage, be sure not to damage the skin around it.  If you have a wound, look at it several times a day to make sure it is healing.  Do not use heating pads or hot water bottles. They may burn your skin. If you have lost feeling in your feet or legs, you may not know it is happening until it is too late.  Make sure your health care provider performs a complete foot exam at least annually or more often if you have foot problems. Report any cuts, sores, or bruises to your health care provider immediately. SEEK MEDICAL CARE IF:   You have an injury that is not healing.  You have cuts or breaks in the skin.  You have an ingrown nail.  You notice redness on your legs or feet.  You feel burning or tingling in your legs or feet.  You have pain or cramps in your legs and feet.  Your legs or feet are numb.  Your feet always feel cold. SEEK IMMEDIATE MEDICAL CARE IF:   There is increasing redness, swelling, or pain in or around a wound.  There is a red line that goes up your leg.  Pus is coming from a wound.  You develop a fever or as directed by your health care provider.  You notice a bad smell coming from an ulcer or wound. Document Released: 01/22/2000 Document Revised: 09/26/2012 Document Reviewed: 07/03/2012 [facility] Patient Information 2015 [place], [place]. This information is not intended to replace advice given to you by your health care provider. Make sure you discuss any questions you have with your health care provider.

## 2014-06-03 NOTE — Assessment & Plan Note (Signed)
Will obtain fasting lipid panel today to reassess.  Continue Lipitor as directed.

## 2014-06-03 NOTE — Progress Notes (Signed)
Pre visit review using our clinic review tool, if applicable. No additional management support is needed unless otherwise documented below in the visit note. 

## 2014-06-03 NOTE — Assessment & Plan Note (Signed)
Foot exam without abnormal findings.  Patient on ACEI.  Continue same.  Will repeat A1C and BMP today. Continue diet and exercise regimen.

## 2014-06-03 NOTE — Assessment & Plan Note (Signed)
Rx Kenalog cream to use daily. If no improvement, recommend Derm referral.

## 2014-06-03 NOTE — Assessment & Plan Note (Signed)
Stable. Continue current regimen. Medications refilled. 

## 2014-06-03 NOTE — Progress Notes (Signed)
   Patient presents to clinic today for 3 month follow-up of hypertension.  Patient endorses taking medication daily as directed. Patient denies chest pain, palpitations, lightheadedness, dizziness, vision changes or frequent headaches.  Patient endorses taking Lipitor daily as directed.  Past Medical History  Diagnosis Date  . Hypertension     Current Outpatient Prescriptions on File Prior to Visit  Medication Sig Dispense Refill  . OVER THE COUNTER MEDICATION Take 1 tablet by mouth as directed. AB CUTS: CLA, GARCINIA CAMBOGIA, GREEN COFFEE BEAN, GREEN TEA EXTRACT, CHROMIUM     No current facility-administered medications on file prior to visit.    No Known Allergies  Family History  Problem Relation Age of Onset  . Hypertension Mother     Living  . Arthritis Mother   . Stroke Father [id number]    Deceased  . Heart disease Sister     #1  . Hypertension Sister     #5  . Diabetes Brother     Deceased  . Stroke Brother     #2  . Alcohol abuse Brother     #2  . Alcohol abuse Father   . Throat cancer Brother     #3  . Arthritis/Rheumatoid Daughter   . Healthy Daughter     #2  . Alcohol abuse Son     History   Social History  . Marital Status: Married    Spouse Name: N/A  . Number of Children: N/A  . Years of Education: N/A   Social History Main Topics  . Smoking status: Never Smoker   . Smokeless tobacco: Never Used  . Alcohol Use: No  . Drug Use: No  . Sexual Activity: Not on file   Other Topics Concern  . None   Social History Narrative   Review of Systems - See HPI.  All other ROS are negative.  BP 132/80 mmHg  Pulse 72  Temp(Src) 98.3 F (36.8 C)  Resp 16  Ht 5\' 10"  (1.778 m)  Wt 221 lb (100.245 kg)  BMI 31.71 kg/m2  Physical Exam  Constitutional: He is oriented to person, place, and time and well-developed, well-nourished, and in no distress.  HENT:  Head: Normocephalic and atraumatic.  Cardiovascular: Normal rate, regular rhythm, normal heart  sounds and intact distal pulses.   Pulmonary/Chest: Effort normal and breath sounds normal. No respiratory distress. He has no wheezes. He has no rales. He exhibits no tenderness.  Neurological: He is alert and oriented to person, place, and time.  Skin: Skin is warm and dry.  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  Psychiatric: Affect normal.  Vitals reviewed.  Diabetic Foot Exam - Simple   Simple Foot Form  Diabetic Foot exam was performed with the following findings:  Yes 06/03/2014  9:45 AM  Visual Inspection  Sensation Testing  Pulse Check  Comments      Assessment/Plan: Decreased libido Without sexual dysfunction.  Supportive measures discussed.  Will check testosterone level today.   Diabetes mellitus type 2, controlled, without complications Foot exam without abnormal findings.  Patient on ACEI.  Continue same.  Will repeat A1C and BMP today. Continue diet and exercise regimen.   Essential hypertension, benign Stable.  Continue current regimen.  Medications refilled.   Hyperlipidemia Will obtain fasting lipid panel today to reassess.  Continue Lipitor as directed.   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 If no improvement, recommend Derm referral.

## 2014-06-04 ENCOUNTER — Other Ambulatory Visit: Payer: Self-pay | Admitting: Physician Assistant

## 2014-06-04 DIAGNOSIS — E119 Type 2 diabetes mellitus without complications: Secondary | ICD-10-CM

## 2014-06-04 MED ORDER — METFORMIN HCL 500 MG PO TABS: 500.0000 mg | ORAL_TABLET | Freq: Every day | ORAL | Status: DC

## 2014-12-22 ENCOUNTER — Telehealth: Payer: Self-pay | Admitting: Physician Assistant

## 2014-12-22 DIAGNOSIS — I10 Essential (primary) hypertension: Secondary | ICD-10-CM

## 2014-12-22 DIAGNOSIS — E785 Hyperlipidemia, unspecified: Secondary | ICD-10-CM

## 2014-12-22 MED ORDER — LISINOPRIL-HYDROCHLOROTHIAZIDE 20-12.5 MG PO TABS: 1.0000 | ORAL_TABLET | Freq: Every day | ORAL | Status: DC

## 2014-12-22 MED ORDER — ATORVASTATIN CALCIUM 20 MG PO TABS: 20.0000 mg | ORAL_TABLET | Freq: Every day | ORAL | Status: DC

## 2014-12-22 NOTE — Telephone Encounter (Signed)
Pt last seen 06/03/14. Last refill 11/27/14.  Please advise if you would like me to send in a one month supply and have pt come in for an appointment.

## 2014-12-22 NOTE — Telephone Encounter (Signed)
Ok to give one month of each but call him and tell him to schedule follow-up

## 2014-12-22 NOTE — Telephone Encounter (Signed)
Pt has been scheduled for 01/06/15. Will refill medications today.

## 2014-12-22 NOTE — Telephone Encounter (Signed)
Pharmacy: [id number] - [place], [place] - [address]  Reason for call: Pt needing lisinopril and atorvastatin. A few days left.

## 2015-01-05 ENCOUNTER — Telehealth: Payer: Self-pay

## 2015-01-05 NOTE — Telephone Encounter (Signed)
Left message for return call from patient. Pre-Visit information

## 2015-01-06 ENCOUNTER — Encounter: Payer: Self-pay | Admitting: Physician Assistant

## 2015-01-06 ENCOUNTER — Ambulatory Visit (INDEPENDENT_AMBULATORY_CARE_PROVIDER_SITE_OTHER): Payer: Medicare Other | Admitting: Physician Assistant

## 2015-01-06 VITALS — BP 130/80 | HR 60 | Temp 98.0°F | Resp 16 | Ht 69.5 in | Wt 215.2 lb

## 2015-01-06 DIAGNOSIS — I10 Essential (primary) hypertension: Secondary | ICD-10-CM | POA: Diagnosis not present

## 2015-01-06 DIAGNOSIS — R5383 Other fatigue: Secondary | ICD-10-CM

## 2015-01-06 DIAGNOSIS — Z125 Encounter for screening for malignant neoplasm of prostate: Secondary | ICD-10-CM | POA: Diagnosis not present

## 2015-01-06 DIAGNOSIS — Z0000 Encounter for general adult medical examination without abnormal findings: Secondary | ICD-10-CM

## 2015-01-06 DIAGNOSIS — E785 Hyperlipidemia, unspecified: Secondary | ICD-10-CM | POA: Diagnosis not present

## 2015-01-06 DIAGNOSIS — R9431 Abnormal electrocardiogram [ECG] [EKG]: Secondary | ICD-10-CM

## 2015-01-06 DIAGNOSIS — E119 Type 2 diabetes mellitus without complications: Secondary | ICD-10-CM | POA: Diagnosis not present

## 2015-01-06 LAB — URINALYSIS, ROUTINE W REFLEX MICROSCOPIC
Bilirubin Urine: NEGATIVE
Hgb urine dipstick: NEGATIVE
Ketones, ur: NEGATIVE
Leukocytes, UA: NEGATIVE
Nitrite: NEGATIVE
PH: 8 (ref 5.0–8.0)
SPECIFIC GRAVITY, URINE: 1.02 (ref 1.000–1.030)
Total Protein, Urine: NEGATIVE
Urine Glucose: NEGATIVE
Urobilinogen, UA: 2 — AB (ref 0.0–1.0)

## 2015-01-06 LAB — CBC
HCT: 47.2 % (ref 39.0–52.0)
Hemoglobin: 15.6 g/dL (ref 13.0–17.0)
MCHC: 33.1 g/dL (ref 30.0–36.0)
MCV: 88.5 fl (ref 78.0–100.0)
Platelets: 212 10*3/uL (ref 150.0–400.0)
RBC: 5.34 Mil/uL (ref 4.22–5.81)
RDW: 13.7 % (ref 11.5–15.5)
WBC: 6.2 10*3/uL (ref 4.0–10.5)

## 2015-01-06 LAB — COMPREHENSIVE METABOLIC PANEL
ALT: 45 U/L (ref 0–53)
AST: 38 U/L — ABNORMAL HIGH (ref 0–37)
Albumin: 4.1 g/dL (ref 3.5–5.2)
Alkaline Phosphatase: 46 U/L (ref 39–117)
BUN: 18 mg/dL (ref 6–23)
CO2: 33 meq/L — AB (ref 19–32)
Calcium: 9.9 mg/dL (ref 8.4–10.5)
Chloride: 100 mEq/L (ref 96–112)
Creatinine, Ser: 1.05 mg/dL (ref 0.40–1.50)
GFR: 91.24 mL/min (ref >60.00)
GLUCOSE: 122 mg/dL — AB (ref 70–99)
POTASSIUM: 4.3 meq/L (ref 3.5–5.1)
SODIUM: 140 meq/L (ref 135–145)
Total Bilirubin: 1.3 mg/dL — ABNORMAL HIGH (ref 0.2–1.2)
Total Protein: 7.4 g/dL (ref 6.0–8.3)

## 2015-01-06 LAB — LIPID PANEL
CHOL/HDL RATIO: 4
Cholesterol: 233 mg/dL — ABNORMAL HIGH (ref 0–200)
HDL: 54.5 mg/dL (ref >39.00)
LDL Cholesterol: 161 mg/dL — ABNORMAL HIGH (ref 0–99)
NONHDL: 178.64
Triglycerides: 88 mg/dL (ref 0.0–149.0)
VLDL: 17.6 mg/dL (ref 0.0–40.0)

## 2015-01-06 LAB — HEMOGLOBIN A1C: Hgb A1c MFr Bld: 6.8 % — ABNORMAL HIGH (ref 4.6–6.5)

## 2015-01-06 LAB — PSA, MEDICARE: PSA: 0.52 ng/ml (ref 0.10–4.00)

## 2015-01-06 LAB — VITAMIN D 25 HYDROXY (VIT D DEFICIENCY, FRACTURES): VITD: 16.48 ng/mL — AB (ref 30.00–100.00)

## 2015-01-06 LAB — TSH: TSH: 1.63 u[IU]/mL (ref 0.35–4.50)

## 2015-01-06 NOTE — Progress Notes (Signed)
Pre visit review using our clinic review tool, if applicable. No additional management support is needed unless otherwise documented below in the visit note/SLS  

## 2015-01-06 NOTE — Patient Instructions (Signed)
Please continue medications as directed.  Go to the lab for blood work. I will call with your results. We will alter medications based on results.  Stay hydrated and keep active. You will be contacted by Cardiology for assessment of your abnormal EKG.  Preventive Care for Adults, Male A healthy lifestyle and preventive care can promote health and wellness. Preventive health guidelines for men include the following key practices:  A routine yearly physical is a good way to check with your health care provider about your health and preventative screening. It is a chance to share any concerns and updates on your health and to receive a thorough exam.  Visit your dentist for a routine exam and preventative care every 6 months. Brush your teeth twice a day and floss once a day. Good oral hygiene prevents tooth decay and gum disease.  The frequency of eye exams is based on your age, health, family medical history, use of contact lenses, and other factors. Follow your health care provider's recommendations for frequency of eye exams.  Eat a healthy diet. Foods such as vegetables, fruits, whole grains, low-fat dairy products, and lean protein foods contain the nutrients you need without too many calories. Decrease your intake of foods high in solid fats, added sugars, and salt. Eat the right amount of calories for you.Get information about a proper diet from your health care provider, if necessary.  Regular physical exercise is one of the most important things you can do for your health. Most adults should get at least 150 minutes of moderate-intensity exercise (any activity that increases your heart rate and causes you to sweat) each week. In addition, most adults need muscle-strengthening exercises on 2 or more days a week.  Maintain a healthy weight. The body mass index (BMI) is a screening tool to identify possible weight problems. It provides an estimate of body fat based on height and weight.  Your health care provider can find your BMI and can help you achieve or maintain a healthy weight.For adults 20 years and older:  A BMI below 18.5 is considered underweight.  A BMI of 18.5 to 24.9 is normal.  A BMI of 25 to 29.9 is considered overweight.  A BMI of 30 and above is considered obese.  Maintain normal blood lipids and cholesterol levels by exercising and minimizing your intake of saturated fat. Eat a balanced diet with plenty of fruit and vegetables. Blood tests for lipids and cholesterol should begin at age 97 and be repeated every 5 years. If your lipid or cholesterol levels are high, you are over 50, or you are at high risk for heart disease, you may need your cholesterol levels checked more frequently.Ongoing high lipid and cholesterol levels should be treated with medicines if diet and exercise are not working.  If you smoke, find out from your health care provider how to quit. If you do not use tobacco, do not start.  Lung cancer screening is recommended for adults aged 65-80 years who are at high risk for developing lung cancer because of a history of smoking. A yearly low-dose CT scan of the lungs is recommended for people who have at least a 30-pack-year history of smoking and are a current smoker or have quit within the past 15 years. A pack year of smoking is smoking an average of 1 pack of cigarettes a day for 1 year (for example: 1 pack a day for 30 years or 2 packs a day for 15 years). Yearly screening  should continue until the smoker has stopped smoking for at least 15 years. Yearly screening should be stopped for people who develop a health problem that would prevent them from having lung cancer treatment.  If you choose to drink alcohol, do not have more than 2 drinks per day. One drink is considered to be 12 ounces (355 mL) of beer, 5 ounces (148 mL) of wine, or 1.5 ounces (44 mL) of liquor.  Avoid use of street drugs. Do not share needles with anyone. Ask for help  if you need support or instructions about stopping the use of drugs.  High blood pressure causes heart disease and increases the risk of stroke. Your blood pressure should be checked at least every 1-2 years. Ongoing high blood pressure should be treated with medicines, if weight loss and exercise are not effective.  If you are 41-65 years old, ask your health care provider if you should take aspirin to prevent heart disease.  Diabetes screening is done by taking a blood sample to check your blood glucose level after you have not eaten for a certain period of time (fasting). If you are not overweight and you do not have risk factors for diabetes, you should be screened once every 3 years starting at age 7. If you are overweight or obese and you are 34-79 years of age, you should be screened for diabetes every year as part of your cardiovascular risk assessment.  Colorectal cancer can be detected and often prevented. Most routine colorectal cancer screening begins at the age of 34 and continues through age 30. However, your health care provider may recommend screening at an earlier age if you have risk factors for colon cancer. On a yearly basis, your health care provider may provide home test kits to check for hidden blood in the stool. Use of a small camera at the end of a tube to directly examine the colon (sigmoidoscopy or colonoscopy) can detect the earliest forms of colorectal cancer. Talk to your health care provider about this at age 42, when routine screening begins. Direct exam of the colon should be repeated every 5-10 years through age 51, unless early forms of precancerous polyps or small growths are found.  People who are at an increased risk for hepatitis B should be screened for this virus. You are considered at high risk for hepatitis B if:  You were born in a country where hepatitis B occurs often. Talk with your health care provider about which countries are considered high  risk.  Your parents were born in a high-risk country and you have not received a shot to protect against hepatitis B (hepatitis B vaccine).  You have HIV or AIDS.  You use needles to inject street drugs.  You live with, or have sex with, someone who has hepatitis B.  You are a man who has sex with other men (MSM).  You get hemodialysis treatment.  You take certain medicines for conditions such as cancer, organ transplantation, and autoimmune conditions.  Hepatitis C blood testing is recommended for all people born from [birth year] through 1965 and any individual with known risks for hepatitis C.  Practice safe sex. Use condoms and avoid high-risk sexual practices to reduce the spread of sexually transmitted infections (STIs). STIs include gonorrhea, chlamydia, syphilis, trichomonas, herpes, HPV, and human immunodeficiency virus (HIV). Herpes, HIV, and HPV are viral illnesses that have no cure. They can result in disability, cancer, and death.  If you are a man who has  sex with other men, you should be screened at least once per year for:  HIV.  Urethral, rectal, and pharyngeal infection of gonorrhea, chlamydia, or both.  If you are at risk of being infected with HIV, it is recommended that you take a prescription medicine daily to prevent HIV infection. This is called preexposure prophylaxis (PrEP). You are considered at risk if:  You are a man who has sex with other men (MSM) and have other risk factors.  You are a heterosexual man, are sexually active, and are at increased risk for HIV infection.  You take drugs by injection.  You are sexually active with a partner who has HIV.  Talk with your health care provider about whether you are at high risk of being infected with HIV. If you choose to begin PrEP, you should first be tested for HIV. You should then be tested every 3 months for as long as you are taking PrEP.  A one-time screening for abdominal aortic aneurysm (AAA) and  surgical repair of large AAAs by ultrasound are recommended for men ages 31 to 34 years who are current or former smokers.  Healthy men should no longer receive prostate-specific antigen (PSA) blood tests as part of routine cancer screening. Talk with your health care provider about prostate cancer screening.  Testicular cancer screening is not recommended for adult males who have no symptoms. Screening includes self-exam, a health care provider exam, and other screening tests. Consult with your health care provider about any symptoms you have or any concerns you have about testicular cancer.  Use sunscreen. Apply sunscreen liberally and repeatedly throughout the day. You should seek shade when your shadow is shorter than you. Protect yourself by wearing long sleeves, pants, a wide-brimmed hat, and sunglasses year round, whenever you are outdoors.  Once a month, do a whole-body skin exam, using a mirror to look at the skin on your back. Tell your health care provider about new moles, moles that have irregular borders, moles that are larger than a pencil eraser, or moles that have changed in shape or color.  Stay current with required vaccines (immunizations).  Influenza vaccine. All adults should be immunized every year.  Tetanus, diphtheria, and acellular pertussis (Td, Tdap) vaccine. An adult who has not previously received Tdap or who does not know his vaccine status should receive 1 dose of Tdap. This initial dose should be followed by tetanus and diphtheria toxoids (Td) booster doses every 10 years. Adults with an unknown or incomplete history of completing a 3-dose immunization series with Td-containing vaccines should begin or complete a primary immunization series including a Tdap dose. Adults should receive a Td booster every 10 years.  Varicella vaccine. An adult without evidence of immunity to varicella should receive 2 doses or a second dose if he has previously received 1 dose.  Human  papillomavirus (HPV) vaccine. Males aged 11-21 years who have not received the vaccine previously should receive the 3-dose series. Males aged 22-26 years may be immunized. Immunization is recommended through the age of 20 years for any male who has sex with males and did not get any or all doses earlier. Immunization is recommended for any person with an immunocompromised condition through the age of 86 years if he did not get any or all doses earlier. During the 3-dose series, the second dose should be obtained 4-8 weeks after the first dose. The third dose should be obtained 24 weeks after the first dose and 16 weeks after  the second dose.  Zoster vaccine. One dose is recommended for adults aged 48 years or older unless certain conditions are present.  Measles, mumps, and rubella (MMR) vaccine. Adults born before [birth year] generally are considered immune to measles and mumps. Adults born in [birth year] or later should have 1 or more doses of MMR vaccine unless there is a contraindication to the vaccine or there is laboratory evidence of immunity to each of the three diseases. A routine second dose of MMR vaccine should be obtained at least 28 days after the first dose for students attending postsecondary schools, health care workers, or international travelers. People who received inactivated measles vaccine or an unknown type of measles vaccine during 1963-1967 should receive 2 doses of MMR vaccine. People who received inactivated mumps vaccine or an unknown type of mumps vaccine before 1979 and are at high risk for mumps infection should consider immunization with 2 doses of MMR vaccine. Unvaccinated health care workers born before [birth year] who lack laboratory evidence of measles, mumps, or rubella immunity or laboratory confirmation of disease should consider measles and mumps immunization with 2 doses of MMR vaccine or rubella immunization with 1 dose of MMR vaccine.  Pneumococcal 13-valent conjugate (PCV13) vaccine.  When indicated, a person who is uncertain of his immunization history and has no record of immunization should receive the PCV13 vaccine. All adults 38 years of age and older should receive this vaccine. An adult aged 64 years or older who has certain medical conditions and has not been previously immunized should receive 1 dose of PCV13 vaccine. This PCV13 should be followed with a dose of pneumococcal polysaccharide (PPSV23) vaccine. Adults who are at high risk for pneumococcal disease should obtain the PPSV23 vaccine at least 8 weeks after the dose of PCV13 vaccine. Adults older than 64 years of age who have normal immune system function should obtain the PPSV23 vaccine dose at least 1 year after the dose of PCV13 vaccine.  Pneumococcal polysaccharide (PPSV23) vaccine. When PCV13 is also indicated, PCV13 should be obtained first. All adults aged 68 years and older should be immunized. An adult younger than age 61 years who has certain medical conditions should be immunized. Any person who resides in a nursing home or long-term care facility should be immunized. An adult smoker should be immunized. People with an immunocompromised condition and certain other conditions should receive both PCV13 and PPSV23 vaccines. People with human immunodeficiency virus (HIV) infection should be immunized as soon as possible after diagnosis. Immunization during chemotherapy or radiation therapy should be avoided. Routine use of PPSV23 vaccine is not recommended for American Indians, [place] Natives, or people younger than 65 years unless there are medical conditions that require PPSV23 vaccine. When indicated, people who have unknown immunization and have no record of immunization should receive PPSV23 vaccine. One-time revaccination 5 years after the first dose of PPSV23 is recommended for people aged 19-64 years who have chronic kidney failure, nephrotic syndrome, asplenia, or immunocompromised conditions. People who received  1-2 doses of PPSV23 before age 78 years should receive another dose of PPSV23 vaccine at age 70 years or later if at least 5 years have passed since the previous dose. Doses of PPSV23 are not needed for people immunized with PPSV23 at or after age 49 years.  Meningococcal vaccine. Adults with asplenia or persistent complement component deficiencies should receive 2 doses of quadrivalent meningococcal conjugate (MenACWY-D) vaccine. The doses should be obtained at least 2 months apart. Microbiologists working with certain meningococcal bacteria,  [place] recruits, people at risk during an outbreak, and people who travel to or live in countries with a high rate of meningitis should be immunized. A first-year college student up through age 55 years who is living in a residence hall should receive a dose if he did not receive a dose on or after his 16th birthday. Adults who have certain high-risk conditions should receive one or more doses of vaccine.  Hepatitis A vaccine. Adults who wish to be protected from this disease, have chronic liver disease, work with hepatitis A-infected animals, work in hepatitis A research labs, or travel to or work in countries with a high rate of hepatitis A should be immunized. Adults who were previously unvaccinated and who anticipate close contact with an international adoptee during the first 60 days after arrival in the [place] from a country with a high rate of hepatitis A should be immunized.  Hepatitis B vaccine. Adults should be immunized if they wish to be protected from this disease, are under age 91 years and have diabetes, have chronic liver disease, have had more than one sex partner in the past 6 months, may be exposed to blood or other infectious body fluids, are household contacts or sex partners of hepatitis B positive people, are clients or workers in certain care facilities, or travel to or work in countries with a high rate of hepatitis B.  Haemophilus  influenzae type b (Hib) vaccine. A previously unvaccinated person with asplenia or sickle cell disease or having a scheduled splenectomy should receive 1 dose of Hib vaccine. Regardless of previous immunization, a recipient of a hematopoietic stem cell transplant should receive a 3-dose series 6-12 months after his successful transplant. Hib vaccine is not recommended for adults with HIV infection. Preventive Service / Frequency Ages 22 to 83  Blood pressure check.** / Every 3-5 years.  Lipid and cholesterol check.** / Every 5 years beginning at age 69.  Hepatitis C blood test.** / For any individual with known risks for hepatitis C.  Skin self-exam. / Monthly.  Influenza vaccine. / Every year.  Tetanus, diphtheria, and acellular pertussis (Tdap, Td) vaccine.** / Consult your health care provider. 1 dose of Td every 10 years.  Varicella vaccine.** / Consult your health care provider.  HPV vaccine. / 3 doses over 6 months, if 91 or younger.  Measles, mumps, rubella (MMR) vaccine.** / You need at least 1 dose of MMR if you were born in 1957 or later. You may also need a second dose.  Pneumococcal 13-valent conjugate (PCV13) vaccine.** / Consult your health care provider.  Pneumococcal polysaccharide (PPSV23) vaccine.** / 1 to 2 doses if you smoke cigarettes or if you have certain conditions.  Meningococcal vaccine.** / 1 dose if you are age 14 to 62 years and a Market researcher living in a residence hall, or have one of several medical conditions. You may also need additional booster doses.  Hepatitis A vaccine.** / Consult your health care provider.  Hepatitis B vaccine.** / Consult your health care provider.  Haemophilus influenzae type b (Hib) vaccine.** / Consult your health care provider. Ages 38 to 74  Blood pressure check.** / Every year.  Lipid and cholesterol check.** / Every 5 years beginning at age 40.  Lung cancer screening. / Every year if you are aged  35-80 years and have a 30-pack-year history of smoking and currently smoke or have quit within the past 15 years. Yearly screening is stopped once you have quit smoking  for at least 15 years or develop a health problem that would prevent you from having lung cancer treatment.  Fecal occult blood test (FOBT) of stool. / Every year beginning at age 41 and continuing until age 39. You may not have to do this test if you get a colonoscopy every 10 years.  Flexible sigmoidoscopy** or colonoscopy.** / Every 5 years for a flexible sigmoidoscopy or every 10 years for a colonoscopy beginning at age 35 and continuing until age 88.  Hepatitis C blood test.** / For all people born from [birth year] through 1965 and any individual with known risks for hepatitis C.  Skin self-exam. / Monthly.  Influenza vaccine. / Every year.  Tetanus, diphtheria, and acellular pertussis (Tdap/Td) vaccine.** / Consult your health care provider. 1 dose of Td every 10 years.  Varicella vaccine.** / Consult your health care provider.  Zoster vaccine.** / 1 dose for adults aged 31 years or older.  Measles, mumps, rubella (MMR) vaccine.** / You need at least 1 dose of MMR if you were born in 1957 or later. You may also need a second dose.  Pneumococcal 13-valent conjugate (PCV13) vaccine.** / Consult your health care provider.  Pneumococcal polysaccharide (PPSV23) vaccine.** / 1 to 2 doses if you smoke cigarettes or if you have certain conditions.  Meningococcal vaccine.** / Consult your health care provider.  Hepatitis A vaccine.** / Consult your health care provider.  Hepatitis B vaccine.** / Consult your health care provider.  Haemophilus influenzae type b (Hib) vaccine.** / Consult your health care provider. Ages 16 and over  Blood pressure check.** / Every year.  Lipid and cholesterol check.**/ Every 5 years beginning at age 23.  Lung cancer screening. / Every year if you are aged 55-80 years and have a 30-pack-year  history of smoking and currently smoke or have quit within the past 15 years. Yearly screening is stopped once you have quit smoking for at least 15 years or develop a health problem that would prevent you from having lung cancer treatment.  Fecal occult blood test (FOBT) of stool. / Every year beginning at age 74 and continuing until age 60. You may not have to do this test if you get a colonoscopy every 10 years.  Flexible sigmoidoscopy** or colonoscopy.** / Every 5 years for a flexible sigmoidoscopy or every 10 years for a colonoscopy beginning at age 2 and continuing until age 50.  Hepatitis C blood test.** / For all people born from [birth year] through 1965 and any individual with known risks for hepatitis C.  Abdominal aortic aneurysm (AAA) screening.** / A one-time screening for ages 39 to 46 years who are current or former smokers.  Skin self-exam. / Monthly.  Influenza vaccine. / Every year.  Tetanus, diphtheria, and acellular pertussis (Tdap/Td) vaccine.** / 1 dose of Td every 10 years.  Varicella vaccine.** / Consult your health care provider.  Zoster vaccine.** / 1 dose for adults aged 40 years or older.  Pneumococcal 13-valent conjugate (PCV13) vaccine.** / 1 dose for all adults aged 45 years and older.  Pneumococcal polysaccharide (PPSV23) vaccine.** / 1 dose for all adults aged 12 years and older.  Meningococcal vaccine.** / Consult your health care provider.  Hepatitis A vaccine.** / Consult your health care provider.  Hepatitis B vaccine.** / Consult your health care provider.  Haemophilus influenzae type b (Hib) vaccine.** / Consult your health care provider. **Family history and personal history of risk and conditions may change your health care provider's recommendations.  This information is not intended to replace advice given to you by your health care provider. Make sure you discuss any questions you have with your health care provider.   Document Released:  03/22/2001 Document Revised: 02/14/2014 Document Reviewed: 06/21/2010 Elsevier Interactive Patient Education [employer].

## 2015-01-06 NOTE — Progress Notes (Signed)
Subjective:    Maxwell Hanson 64 y.o. male who presents for Medicare Annual/Subsequent preventive examination.   Preventive Screening-Counseling & Management  Tobacco History  Smoking status  . Never Smoker   Smokeless tobacco  . Never Used    Problems Prior to Visit 1. Diabetes Mellitus II, controlled, without complications -- Last Hanson!c at 7.3. Patient encouraged to take Metformin as directed. States he tried medication but it made him itchy. States he does not feel that he has diabetes. . 2. Hyperlipidemia -- Endorses taking medications as directed. Denies myalgias. Is trying to watch diet.   3. Hypertension -- Endorses taking medications as directed. Patient denies chest pain, palpitations, lightheadedness, dizziness, vision changes or frequent headaches.  BP Readings from Last 3 Encounters:  01/06/15 130/80  06/03/14 132/80  02/21/14 119/83   Patient complains of fatigue, worse in the mornings over the pat 6 months. Is sleeping well at night. Denies change to diet. Is not exercising. Denies hx of anemia or vitamin deficiency. Denies depressed mood, anhedonia or increased stressors.   Current Problems (verified) Patient Active Problem List   Diagnosis Date Noted  . Diabetes mellitus type 2, controlled, without complications (HCC) 06/03/2014  . Decreased libido 06/03/2014  . Lichen simplex chronicus 06/03/2014  . Hyperlipidemia 02/21/2014  . Diastolic dysfunction 02/21/2014  . Essential hypertension, benign 09/30/2013  . Chronic low back pain 09/30/2013  . Lumbar spondylosis 09/26/2013    Medications Prior to Visit Current Outpatient Prescriptions on File Prior to Visit  Medication Sig Dispense Refill  . atorvastatin (LIPITOR) 20 MG tablet Take 1 tablet (20 mg total) by mouth daily. 30 tablet 0  . lisinopril-hydrochlorothiazide (PRINZIDE,ZESTORETIC) 20-12.5 MG tablet Take 1 tablet by mouth daily. 30 tablet 0  . OVER THE COUNTER MEDICATION Take 1 tablet by mouth as  directed. AB CUTS: CLA, GARCINIA CAMBOGIA, GREEN COFFEE BEAN, GREEN TEA EXTRACT, CHROMIUM     No current facility-administered medications on file prior to visit.    Current Medications (verified) Current Outpatient Prescriptions  Medication Sig Dispense Refill  . aspirin 81 MG tablet Take 81 mg by mouth daily.    Marland Kitchen. atorvastatin (LIPITOR) 20 MG tablet Take 1 tablet (20 mg total) by mouth daily. 30 tablet 0  . lisinopril-hydrochlorothiazide (PRINZIDE,ZESTORETIC) 20-12.5 MG tablet Take 1 tablet by mouth daily. 30 tablet 0  . OVER THE COUNTER MEDICATION Take 1 tablet by mouth as directed. AB CUTS: CLA, GARCINIA CAMBOGIA, GREEN COFFEE BEAN, GREEN TEA EXTRACT, CHROMIUM     No current facility-administered medications for this visit.     Allergies (verified) Metformin and related   PAST HISTORY  Family History Family History  Problem Relation Age of Onset  . Hypertension Mother     Living  . Arthritis Mother   . Stroke Father [id number]    Deceased  . Heart disease Sister     #1  . Hypertension Sister     #5  . Diabetes Brother     Deceased  . Stroke Brother     #2  . Alcohol abuse Brother     #2  . Alcohol abuse Father   . Throat cancer Brother     #3  . Arthritis/Rheumatoid Daughter   . Healthy Daughter     #2  . Alcohol abuse Son     Social History Social History  Substance Use Topics  . Smoking status: Never Smoker   . Smokeless tobacco: Never Used  . Alcohol Use: No    Are  there smokers in your home (other than you)?  No  Risk Factors Current exercise habits: The patient does not participate in regular exercise at present.  Dietary issues discussed: Endorses well-balanced diet. Body mass index is 31.34 kg/(m^2).   Cardiac risk factors: advanced age (older than 62 for men, 2 for women), dyslipidemia, family history of premature cardiovascular disease, hypertension, male gender, obesity (BMI >= 30 kg/m2) and sedentary lifestyle.  Depression Screen (Note: if  answer to either of the following is "Yes", Hanson more complete depression screening is indicated)   Q1: Over the past two weeks, have you felt down, depressed or hopeless? No  Q2: Over the past two weeks, have you felt little interest or pleasure in doing things? No  Have you lost interest or pleasure in daily life? No  Do you often feel hopeless? No  Do you cry easily over simple problems? No  Activities of Daily Living In your present state of health, do you have any difficulty performing the following activities?:  Driving? No Managing money?  No Feeding yourself? No Getting from bed to chair? No Climbing Hanson flight of stairs? No Preparing food and eating?: No Bathing or showering? No Getting dressed: No Getting to the toilet? No Using the toilet:No Moving around from place to place: No In the past year have you fallen or had Hanson near fall?:No   Are you sexually active?  Yes  Do you have more than one partner?  No  Hearing Difficulties: No Do you often ask people to speak up or repeat themselves? No Do you experience ringing or noises in your ears? No Do you have difficulty understanding soft or whispered voices? No   Do you feel that you have Hanson problem with memory? No  Do you often misplace items? No  Do you feel safe at home?  Yes  Cognitive Testing  Alert? Yes  Normal Appearance?Yes  Oriented to person? Yes  Place? Yes   Time? Yes  Recall of three objects?  Yes  Can perform simple calculations? Yes  Displays appropriate judgment?Yes  Can read the correct time from Hanson watch face?Yes   Advanced Directives have been discussed with the patient? Yes   List the Names of Other Physician/Practitioners you currently use: See Care Teams in EMR.  Indicate any recent Medical Services you may have received from other than [facility] providers in the past year (date may be approximate).  Immunization History  Administered Date(s) Administered  . Tdap 02/08/2011    Screening  Tests Health Maintenance  Topic Date Due  . OPHTHALMOLOGY EXAM  04/16/1960  . HEMOGLOBIN A1C  12/03/2014  . INFLUENZA VACCINE  01/06/2016 (Originally 09/08/2014)  . Hepatitis C Screening  06/03/2019 (Originally 18-Nov-1950)  . HIV Screening  06/03/2019 (Originally 04/16/1965)  . FOOT EXAM  06/03/2015  . COLONOSCOPY  11/08/2015  . DTaP/Tdap/Td (2 - Td) 02/07/2021  . TETANUS/TDAP  02/07/2021  . ZOSTAVAX  Addressed    All answers were reviewed with the patient and necessary referrals were made:  Piedad Climes, PA-C   01/06/2015   History reviewed: allergies, current medications, past family history, past medical history, past social history, past surgical history and problem list  Review of Systems Hanson comprehensive review of systems was negative.    Objective:     Vision by Snellen chart: right eye:20/20, left eye:20/20 Blood pressure 130/80, pulse 60, temperature 98 F (36.7 C), temperature source Oral, resp. rate 16, height 5' 9.5" (1.765 m), weight 215 lb  4 oz (97.637 kg), SpO2 98 %. Body mass index is 31.34 kg/(m^2).  General appearance: alert, cooperative, appears stated age and no distress Head: Normocephalic, without obvious abnormality, atraumatic Eyes: conjunctivae/corneas clear. PERRL, EOM's intact. Fundi benign. Ears: normal TM's and external ear canals both ears Nose: Nares normal. Septum midline. Mucosa normal. No drainage or sinus tenderness. Throat: lips, mucosa, and tongue normal; teeth and gums normal Lungs: clear to auscultation bilaterally Heart: regular rate and rhythm, S1, S2 normal, no murmur, click, rub or gallop Abdomen: soft, non-tender; bowel sounds normal; no masses,  no organomegaly Extremities: extremities normal, atraumatic, no cyanosis or edema Pulses: 2+ and symmetric Lymph nodes: Cervical, supraclavicular, and axillary nodes normal. Neurologic: Grossly normal     Assessment:     (1) Medicare Wellness, Subsequent (2) Diabetes Mellitus  II, controlled (3) Hyperlipidemia (4) Hypertension (5) Fatigue (6) Abnormal EKG findings      Plan:     (1) During the course of the visit the patient was educated and counseled about appropriate screening and preventive services including:    Pneumococcal vaccine   Influenza vaccine  Screening electrocardiogram  Prostate cancer screening  Nutrition counseling   Advanced directives: has an advanced directive - Hanson copy HAS NOT been provided.  Patient declines all immunizations.  (2) Will repeat A1C and BMP today. Foot exam up-to-date. Patient has appointment with Ophthalmology scheduled.  (3) Will check lipid panel and LFTS today to further assess. Continue 81 mg ASA daily.  (4) Stable. Asymptomatic. Will continue current regimen. Will check BMP today.  (5) Unclear. Will check lab panel to include Vitamin D level.  (6) Referral to Cardiology placed for assessment and stress testing.   Patient Instructions (the written plan) was given to the patient.  Medicare Attestation I have personally reviewed: The patient's medical and social history Their use of alcohol, tobacco or illicit drugs Their current medications and supplements The patient's functional ability including ADLs,fall risks, home safety risks, cognitive, and hearing and visual impairment Diet and physical activities Evidence for depression or mood disorders  The patient's weight, height, BMI, and visual acuity have been recorded in the chart.  I have made referrals, counseling, and provided education to the patient based on review of the above and I have provided the patient with Hanson written personalized care plan for preventive services.     Marcelline Mates [place], [place]   01/06/2015

## 2015-01-12 ENCOUNTER — Telehealth: Payer: Self-pay | Admitting: *Deleted

## 2015-01-12 DIAGNOSIS — R7989 Other specified abnormal findings of blood chemistry: Secondary | ICD-10-CM

## 2015-01-12 DIAGNOSIS — R945 Abnormal results of liver function studies: Principal | ICD-10-CM

## 2015-01-12 DIAGNOSIS — R8290 Unspecified abnormal findings in urine: Secondary | ICD-10-CM

## 2015-01-12 MED ORDER — VITAMIN D (ERGOCALCIFEROL) 1.25 MG (50000 UNIT) PO CAPS: 50000.0000 [IU] | ORAL_CAPSULE | ORAL | Status: DC

## 2015-01-12 NOTE — Telephone Encounter (Signed)
Called and spoke with the pt and informed him of recent lab results and note.  Pt verbalized understanding and agreed.   New rx sent to the pharmacy by e-script.  Pt scheduled for 3 month follow-up(04/13/15 @ 9:15) and lab appt for repeat LFT's and UA on (01/16/15 @ 9:30).  Future lab ordered.//AB/CMA

## 2015-01-12 NOTE — Telephone Encounter (Signed)
-----   Message from Waldon MerlWilliam C Martin, PA-C sent at 01/11/2015  5:18 PM EST ----- Lab results are in.  (1) A1C has improved to 6.8. Continue regimen as directed.  (2) Electrolytes and kidney function look great. (3) PSA is normal. Thyroid function is normal. (4) Vitamin D level is low. Recommend once weekly Ergocalciferol 50,000 unit for 12 weeks. Ok to send in Rx. Follow-up 3 months to recheck Vitamin D level. (5) Lastly there is mild elevation in liver enzymes. I want him to limit tylenol-containing products. No alcohol. Follow-up to lab in 1 week for repeat LFTs and UA. (Dx: elevated liver enzymes).

## 2015-01-16 ENCOUNTER — Other Ambulatory Visit (INDEPENDENT_AMBULATORY_CARE_PROVIDER_SITE_OTHER): Payer: Medicare Other

## 2015-01-16 DIAGNOSIS — R8290 Unspecified abnormal findings in urine: Secondary | ICD-10-CM

## 2015-01-16 DIAGNOSIS — E559 Vitamin D deficiency, unspecified: Secondary | ICD-10-CM | POA: Diagnosis not present

## 2015-01-16 DIAGNOSIS — R7989 Other specified abnormal findings of blood chemistry: Secondary | ICD-10-CM | POA: Diagnosis not present

## 2015-01-16 DIAGNOSIS — R945 Abnormal results of liver function studies: Secondary | ICD-10-CM

## 2015-01-16 LAB — HEPATIC FUNCTION PANEL
ALK PHOS: 40 U/L (ref 39–117)
ALT: 51 U/L (ref 0–53)
AST: 43 U/L — ABNORMAL HIGH (ref 0–37)
Albumin: 3.7 g/dL (ref 3.5–5.2)
Bilirubin, Direct: 0.2 mg/dL (ref 0.0–0.3)
TOTAL PROTEIN: 7.2 g/dL (ref 6.0–8.3)
Total Bilirubin: 1.1 mg/dL (ref 0.2–1.2)

## 2015-01-16 LAB — URINALYSIS, ROUTINE W REFLEX MICROSCOPIC
BILIRUBIN URINE: NEGATIVE
Hgb urine dipstick: NEGATIVE
Ketones, ur: NEGATIVE
Leukocytes, UA: NEGATIVE
Nitrite: NEGATIVE
Specific Gravity, Urine: 1.02 (ref 1.000–1.030)
Total Protein, Urine: NEGATIVE
Urine Glucose: NEGATIVE
Urobilinogen, UA: 1 (ref 0.0–1.0)
pH: 7 (ref 5.0–8.0)

## 2015-01-16 LAB — VITAMIN D 25 HYDROXY (VIT D DEFICIENCY, FRACTURES): VITD: 17.37 ng/mL — ABNORMAL LOW (ref 30.00–100.00)

## 2015-01-30 ENCOUNTER — Encounter: Payer: Self-pay | Admitting: Physician Assistant

## 2015-02-05 ENCOUNTER — Ambulatory Visit (INDEPENDENT_AMBULATORY_CARE_PROVIDER_SITE_OTHER): Payer: Medicare Other | Admitting: Physician Assistant

## 2015-02-05 ENCOUNTER — Encounter: Payer: Self-pay | Admitting: Physician Assistant

## 2015-02-05 VITALS — BP 130/70 | HR 80 | Ht 69.5 in | Wt 216.0 lb

## 2015-02-05 DIAGNOSIS — R9431 Abnormal electrocardiogram [ECG] [EKG]: Secondary | ICD-10-CM

## 2015-02-05 DIAGNOSIS — I5189 Other ill-defined heart diseases: Secondary | ICD-10-CM

## 2015-02-05 DIAGNOSIS — I10 Essential (primary) hypertension: Secondary | ICD-10-CM | POA: Diagnosis not present

## 2015-02-05 DIAGNOSIS — I519 Heart disease, unspecified: Secondary | ICD-10-CM | POA: Diagnosis not present

## 2015-02-05 NOTE — Patient Instructions (Addendum)
Medication Instructions:  Your physician recommends that you continue on your current medications as directed. Please refer to the Current Medication list given to you today.   Labwork: None ordered  Testing/Procedures: Your physician has requested that you have a stress echocardiogram. For further information please visit [email or web address] Please follow instruction sheet as given.    Follow-Up: Your physician recommends that you schedule a follow-up appointment in: 1-2 MONTHS WITH DR. Excell Seltzer   Any Other Special Instructions Will Be Listed Below (If Applicable).   Exercise Stress Echocardiogram An exercise stress echocardiogram is a heart (cardiac) test used to check the function of your heart. This test may also be called an exercise stress echocardiography or stress echo. This stress test will check how well your heart muscle and valves are working and determine if your heart muscle is getting enough blood. You will exercise on a treadmill to naturally increase or stress the functioning of your heart.  An echocardiogram uses sound waves (ultrasound) to produce an image of your heart. If your heart does not work normally, it may indicate coronary artery disease with poor coronary blood supply. The coronary arteries are the arteries that bring blood and oxygen to your heart. LET [facility] CARE PROVIDER KNOW ABOUT:  Any allergies you have.  All medicines you are taking, including vitamins, herbs, eye drops, creams, and over-the-counter medicines.  Previous problems you or members of your family have had with the use of anesthetics.  Any blood disorders you have.  Previous surgeries you have had.  Medical conditions you have.  Possibility of pregnancy, if this applies. RISKS AND COMPLICATIONS Generally, this is a safe procedure. However, as with any procedure, complications can occur. Possible complications can include:  You develop pain or pressure in the following  areas:  Chest.  Jaw or neck.  Between your shoulder blades.  Radiating down your left arm.  Dizziness or lightheadedness.  Shortness of breath.  Increased or irregular heartbeat.  Nausea or vomiting.  Heart attack (rare). BEFORE THE PROCEDURE  Avoid all forms of caffeine for 24 hours before your test or as directed by your health care provider. This includes coffee, tea (even decaffeinated tea), caffeinated sodas, chocolate, cocoa, and certain pain medicines.  Follow your health care provider's instructions regarding eating and drinking before the test.  Take your medicines as directed at regular times with water unless instructed otherwise. Exceptions may include:  If you have diabetes, ask how you are to take your insulin or pills. It is common to adjust insulin dosing the morning of the test.  If you are taking beta-blocker medicines, it is important to talk to your health care provider about these medicines well before the date of your test. Taking beta-blocker medicines may interfere with the test. In some cases, these medicines need to be changed or stopped 24 hours or more before the test.  If you wear a nitroglycerin patch, it may need to be removed prior to the test. Ask your health care provider if the patch should be removed before the test.  If you use an inhaler for any breathing condition, bring it with you to the test.  If you are an outpatient, bring a snack so you can eat right after the stress phase of the test.  Do not smoke for 4 hours prior to the test or as directed by your health care provider.  Wear loose-fitting clothes and comfortable shoes for the test. This test involves walking on a treadmill. PROCEDURE  Multiple electrodes will be put on your chest. If needed, small areas of your chest may be shaved to get better contact with the electrodes. Once the electrodes are attached to your body, multiple wires will be attached to the electrodes, and  your heart rate will be monitored.  You will have an echocardiogram done at rest.  To produce this image of your heart, gel is applied to your chest, and a wand-like tool (transducer) is moved over the chest. The transducer sends the sound waves through the chest to create the moving images of your heart.  You may need an IV to receive a medication that improves the quality of the pictures.  You will then walk on a treadmill. The treadmill will be started at a slow pace. The treadmill speed and incline will gradually be increased to raise your heart rate.  At the peak of exercise, the treadmill will be stopped. You will lie down immediately on a bed so that a second echocardiogram can be done to visualize your heart's motion with exercise.  The test usually takes 30-60 minutes to complete. AFTER THE PROCEDURE  Your heart rate and blood pressure will be monitored after the test.  You may return to your normal schedule, including diet, activities, and medicines, unless your health care provider tells you otherwise.   This information is not intended to replace advice given to you by your health care provider. Make sure you discuss any questions you have with your health care provider.   Document Released: 01/29/2004 Document Revised: 01/29/2013 Document Reviewed: 10/01/2012 Elsevier Interactive Patient Education [employer]2016 [employer]vier Inc.   If you need a refill on your cardiac medications before your next appointment, please call your pharmacy.       03/05/15 Per Dr Excell Seltzerooper the pt can follow-up PRN since stress echo was okay.   Julieta GuttingLauren Brown RN

## 2015-02-05 NOTE — Progress Notes (Signed)
Cardiology Office Note   Date:  02/05/2015   ID:  Maxwell CrapeGary Canelo, DOB January 25, 1951, MRN [id number]  PCP:  Piedad ClimesMartin, William Cody, PA-C  Cardiologist:  New - Dr. Excell Seltzerooper  Chief Complaint  Patient presents with  . New Patient (Initial Visit)    refered by Marcelline MatesWilliam Martin PA-C, seen with Dr. Excell Seltzerooper, DOD  . Abnormal ECG      History of Present Illness: Maxwell CrapeGary Hanson is a 64 y.o. male who presents for new cardiology evaluation for abnormal EKG. He has a PMH of HTN, HLD and DM II. He is now off metformin as it exacerbate his psoriasis and he does not believe he has diabetes. His Hgb A1C was 7.3 on 06/03/2014, followup repeat A1C in Nov 2016 was 6.8. He had an ETT in 01/17/2014 which was indeterminate with inferolateral ST depression at baseline, increased ST depression in inferior lead, likely nondiagnostic finding, recommended stress echo. He had TTE (not stress echo) in Jan 2016 which showed EF 55-60%, grade 1 DD, no RWMA.   He has been doing ok since retirement. He denies any chest pain or exertional SOB, he does occasionally ride bicycles, although he wants to start jogging. He was referred to cardiology today for evaluation of abnormal EKG.     Past Medical History  Diagnosis Date  . Hypertension   . Hyperlipidemia   . Diabetes [facility](HCC)     Past Surgical History  Procedure Laterality Date  . Hydrocele excision / repair    . Kidney stone surgery  2013  . Tooth extraction      Full Dentures     Current Outpatient Prescriptions  Medication Sig Dispense Refill  . aspirin 81 MG tablet Take 81 mg by mouth daily.    Marland Kitchen. atorvastatin (LIPITOR) 20 MG tablet Take 1 tablet (20 mg total) by mouth daily. 30 tablet 0  . lisinopril-hydrochlorothiazide (PRINZIDE,ZESTORETIC) 20-12.5 MG tablet Take 1 tablet by mouth daily. 30 tablet 0  . Vitamin D, Ergocalciferol, (DRISDOL) 50000 UNITS CAPS capsule Take 1 capsule (50,000 Units total) by mouth every 7 (seven) days. 12 capsule 0   No current  facility-administered medications for this visit.    Allergies:   Metformin and related    Social History:  The patient  reports that he has quit smoking. He has never used smokeless tobacco. He reports that he does not drink alcohol or use illicit drugs.   Family History:  The patient's family history includes Alcohol abuse in his brother, father, and son; Arthritis in his mother; Arthritis/Rheumatoid in his daughter; Diabetes in his brother; Healthy in his daughter; Heart disease in his sister; Hypertension in his mother and sister; Stroke in his brother; Stroke (age of onset: 4283) in his father; Throat cancer in his brother. There is no history of Heart attack.    ROS:  Please see the history of present illness.   Otherwise, review of systems are positive for back pain.   All other systems are reviewed and negative.    PHYSICAL EXAM: VS:  BP 130/70 mmHg  Pulse 80  Ht 5' 9.5" (1.765 m)  Wt 216 lb (97.977 kg)  BMI 31.45 kg/m2 , BMI Body mass index is 31.45 kg/(m^2). GEN: Well nourished, well developed, in no acute distress HEENT: normal Neck: no JVD, carotid bruits, or masses Cardiac: RRR; no murmurs, rubs, or gallops,no edema  Respiratory:  clear to auscultation bilaterally, normal work of breathing GI: soft, nontender, nondistended, + BS MS: no deformity or atrophy Skin:  warm and dry, no rash Neuro:  Strength and sensation are intact Psych: euthymic mood, full affect   EKG:  EKG is ordered today. The ekg ordered today demonstrates NSR with mild ST depression in inferolateral leads unchanged from prior EKG in 2015   Recent Labs: 01/06/2015: BUN 18; Creatinine, Ser 1.05; Hemoglobin 15.6; Platelets 212.0; Potassium 4.3; Sodium 140; TSH 1.63 01/16/2015: ALT 51    Lipid Panel    Component Value Date/Time   CHOL 233* 01/06/2015 1027   TRIG 88.0 01/06/2015 1027   HDL 54.50 01/06/2015 1027   CHOLHDL 4 01/06/2015 1027   VLDL 17.6 01/06/2015 1027   LDLCALC 161* 01/06/2015 1027       Wt Readings from Last 3 Encounters:  02/05/15 216 lb (97.977 kg)  01/06/15 215 lb 4 oz (97.637 kg)  06/03/14 221 lb (100.245 kg)      Other studies Reviewed: Additional studies/ records that were reviewed today include:    ETT 01/17/2014 Comments: Exercise treadmill with baseline ST depression and T wave  inversion in the inferior lateral leads; good exercise tolerance  (8:30); normal BP response; no chest pain; increased ST  depression in the inferior lateral leads felt to be nondiagnostic due to baseline changes. Suggest stress echocardiogram or nuclear imaging to better assess.     Echo 02/12/2014 LV EF: 55% -  60%  ------------------------------------------------------------------- History:  PMH: Former smoker. Abnormal stress test. Risk factors: Hypertension.  ------------------------------------------------------------------- Study Conclusions  - Left ventricle: The cavity size was normal. Wall thickness was increased in a pattern of severe LVH. Systolic function was normal. The estimated ejection fraction was in the range of 55% to 60%. Wall motion was normal; there were no regional wall motion abnormalities. Doppler parameters are consistent with abnormal left ventricular relaxation (grade 1 diastolic dysfunction). - Left atrium: The atrium was mildly dilated. - Atrial septum: No defect or patent foramen ovale was identified.   Review of the above records demonstrates:    Abnormal EKG in inferolateral leads since 2015, previous ETT in 01/17/2014 which was nondiagnostic, recommended stress echo to followup, however it appears only a regular echo was done which was normal.    ASSESSMENT AND PLAN:  1.  Abnormal EKG with baseline mild inferolateral ST depression since 2015  - asymptomatic, however given prior nondiagnostic and midly abnormal ETT, will arrange stress echo to further assess  - followup in 1-2 month  2. HTN: controlled  on lisinopril/HCTZ  3. HLD: well controlled on lipitor, per pt, PCP check it yearly in Mar. Last lipid panel in Apr shows significant improvement compare to 2015.   4. DM II: off metformin due to its exacerbation of psoriasis, patient does not believe he has DM, however Hgb A1C diagnostic support diagnosis of diabetes.     Current medicines are reviewed at length with the patient today.  The patient does not have concerns regarding medicines.  The following changes have been made:  no change  Labs/ tests ordered today include:   Orders Placed This Encounter  Procedures  . EKG 12-Lead  . Echo stress     Disposition:   FU with Dr. Excell Seltzer in 1-2 months  Signed, Azalee Course PA 02/05/2015 3:21 PM    [facility] [address] [place], [place], [place]  [address] Phone: [phone]; Fax: [phone]   Patient seen, examined. Available data reviewed. Agree with findings, assessment, and plan as outlined by Azalee Course, PA-C.   Tonny Bollman, M.D. 02/05/2015 10:25  PM   

## 2015-02-17 ENCOUNTER — Ambulatory Visit (HOSPITAL_COMMUNITY): Payer: Medicare Other | Attending: Physician Assistant

## 2015-02-17 ENCOUNTER — Ambulatory Visit (HOSPITAL_BASED_OUTPATIENT_CLINIC_OR_DEPARTMENT_OTHER): Payer: Medicare Other

## 2015-02-17 DIAGNOSIS — R9431 Abnormal electrocardiogram [ECG] [EKG]: Secondary | ICD-10-CM | POA: Diagnosis not present

## 2015-02-17 DIAGNOSIS — R0989 Other specified symptoms and signs involving the circulatory and respiratory systems: Secondary | ICD-10-CM

## 2015-02-18 ENCOUNTER — Telehealth: Payer: Self-pay | Admitting: *Deleted

## 2015-02-18 LAB — ECHOCARDIOGRAM STRESS TEST
CHL CUP STRESS STAGE 1 DBP: 91 mmHg
CHL CUP STRESS STAGE 1 SBP: 139 mmHg
CHL CUP STRESS STAGE 2 SPEED: 0 mph
CHL CUP STRESS STAGE 3 GRADE: 10 %
CHL CUP STRESS STAGE 3 SPEED: 1.7 mph
CHL CUP STRESS STAGE 4 DBP: 105 mmHg
CHL CUP STRESS STAGE 4 GRADE: 12 %
CHL CUP STRESS STAGE 4 HR: 130 {beats}/min
CHL CUP STRESS STAGE 4 SBP: 203 mmHg
CHL CUP STRESS STAGE 6 GRADE: 3.8 %
CHL CUP STRESS STAGE 6 HR: 115 {beats}/min
CHL CUP STRESS STAGE 6 SPEED: 0 mph
CHL CUP STRESS STAGE 7 DBP: 79 mmHg
CHL CUP STRESS STAGE 7 GRADE: 0 %
CHL CUP STRESS STAGE 7 SBP: 136 mmHg
CHL CUP STRESS STAGE 7 SPEED: 0 mph
CSEPEW: 1 METS
CSEPPHR: 150 {beats}/min
CSEPPMHR: 96 %
Stage 1 Grade: 0 %
Stage 1 HR: 66 {beats}/min
Stage 1 Speed: 0 mph
Stage 2 Grade: 0.2 %
Stage 2 HR: 66 {beats}/min
Stage 3 HR: 104 {beats}/min
Stage 4 Speed: 2.5 mph
Stage 5 HR: 150 {beats}/min
Stage 7 HR: 87 {beats}/min

## 2015-02-18 NOTE — Telephone Encounter (Signed)
-----   Message from BurnettsvilleHao Meng, GeorgiaPA sent at 02/18/2015  1:16 PM EST ----- Stress test result normal. No significant of significant wall motion abnormality during peak stress to suggest significant underlying blockage. Follow up with Dr. Excell Seltzerooper with cardiology on an as needed bases.

## 2015-02-18 NOTE — Telephone Encounter (Signed)
Per Azalee CourseHao Meng, PA-C, called pt to inform him that his stress test was normal, and showed no signs of a blockage.  Pt was advised that he could follow up with Dr. Excell Seltzerooper on an as needed basis.  Pt verbalized appreciation and understanding.

## 2015-02-20 ENCOUNTER — Other Ambulatory Visit: Payer: Self-pay | Admitting: Physician Assistant

## 2015-02-20 NOTE — Telephone Encounter (Signed)
Rx's sent to the pharmacy by e-script.//AB/CMA 

## 2015-04-13 ENCOUNTER — Ambulatory Visit (INDEPENDENT_AMBULATORY_CARE_PROVIDER_SITE_OTHER): Payer: Medicare Other | Admitting: Physician Assistant

## 2015-04-13 ENCOUNTER — Encounter: Payer: Self-pay | Admitting: Physician Assistant

## 2015-04-13 VITALS — BP 136/80 | HR 54 | Temp 98.2°F | Ht 69.5 in | Wt 220.8 lb

## 2015-04-13 DIAGNOSIS — I10 Essential (primary) hypertension: Secondary | ICD-10-CM

## 2015-04-13 DIAGNOSIS — E785 Hyperlipidemia, unspecified: Secondary | ICD-10-CM

## 2015-04-13 DIAGNOSIS — E119 Type 2 diabetes mellitus without complications: Secondary | ICD-10-CM | POA: Diagnosis not present

## 2015-04-13 DIAGNOSIS — Z1283 Encounter for screening for malignant neoplasm of skin: Secondary | ICD-10-CM | POA: Diagnosis not present

## 2015-04-13 LAB — COMPREHENSIVE METABOLIC PANEL
ALT: 52 U/L (ref 0–53)
AST: 43 U/L — AB (ref 0–37)
Albumin: 4 g/dL (ref 3.5–5.2)
Alkaline Phosphatase: 42 U/L (ref 39–117)
BILIRUBIN TOTAL: 1.3 mg/dL — AB (ref 0.2–1.2)
BUN: 11 mg/dL (ref 6–23)
CHLORIDE: 101 meq/L (ref 96–112)
CO2: 32 meq/L (ref 19–32)
CREATININE: 0.93 mg/dL (ref 0.40–1.50)
Calcium: 9.8 mg/dL (ref 8.4–10.5)
GFR: 104.87 mL/min (ref >60.00)
GLUCOSE: 126 mg/dL — AB (ref 70–99)
Potassium: 3.9 mEq/L (ref 3.5–5.1)
SODIUM: 138 meq/L (ref 135–145)
Total Protein: 7.6 g/dL (ref 6.0–8.3)

## 2015-04-13 LAB — LIPID PANEL
CHOL/HDL RATIO: 3
Cholesterol: 173 mg/dL (ref 0–200)
HDL: 66.8 mg/dL (ref >39.00)
LDL Cholesterol: 92 mg/dL (ref 0–99)
NONHDL: 106.16
TRIGLYCERIDES: 73 mg/dL (ref 0.0–149.0)
VLDL: 14.6 mg/dL (ref 0.0–40.0)

## 2015-04-13 LAB — HEMOGLOBIN A1C: Hgb A1c MFr Bld: 7.1 % — ABNORMAL HIGH (ref 4.6–6.5)

## 2015-04-13 NOTE — Patient Instructions (Signed)
Please continue medications as directed. Stay hydrated and follow the diet recommendations below. Go to the lab for blood work. I will call with your results.  You will be contacted to schedule an appointment with Dermatology.  DASH Eating Plan DASH stands for "Dietary Approaches to Stop Hypertension." The DASH eating plan is a healthy eating plan that has been shown to reduce high blood pressure (hypertension). Additional health benefits may include reducing the risk of type 2 diabetes mellitus, heart disease, and stroke. The DASH eating plan may also help with weight loss. WHAT DO I NEED TO KNOW ABOUT THE DASH EATING PLAN? For the DASH eating plan, you will follow these general guidelines:  Choose foods with a percent daily value for sodium of less than 5% (as listed on the food label).  Use salt-free seasonings or herbs instead of table salt or sea salt.  Check with your health care provider or pharmacist before using salt substitutes.  Eat lower-sodium products, often labeled as "lower sodium" or "no salt added."  Eat fresh foods.  Eat more vegetables, fruits, and low-fat dairy products.  Choose whole grains. Look for the word "whole" as the first word in the ingredient list.  Choose fish and skinless chicken or [place] more often than red meat. Limit fish, poultry, and meat to 6 oz (170 g) each day.  Limit sweets, desserts, sugars, and sugary drinks.  Choose heart-healthy fats.  Limit cheese to 1 oz (28 g) per day.  Eat more home-cooked food and less restaurant, buffet, and fast food.  Limit fried foods.  Cook foods using methods other than frying.  Limit canned vegetables. If you do use them, rinse them well to decrease the sodium.  When eating at a restaurant, ask that your food be prepared with less salt, or no salt if possible. WHAT FOODS CAN I EAT? Seek help from a dietitian for individual calorie needs. Grains Whole grain or whole wheat bread. Brown rice. Whole  grain or whole wheat pasta. Quinoa, bulgur, and whole grain cereals. Low-sodium cereals. Corn or whole wheat flour tortillas. Whole grain cornbread. Whole grain crackers. Low-sodium crackers. Vegetables Fresh or frozen vegetables (raw, steamed, roasted, or grilled). Low-sodium or reduced-sodium tomato and vegetable juices. Low-sodium or reduced-sodium tomato sauce and paste. Low-sodium or reduced-sodium canned vegetables.  Fruits All fresh, canned (in natural juice), or frozen fruits. Meat and Other Protein Products Ground beef (85% or leaner), grass-fed beef, or beef trimmed of fat. Skinless chicken or [place]. Ground chicken or [place]. Pork trimmed of fat. All fish and seafood. Eggs. Dried beans, peas, or lentils. Unsalted nuts and seeds. Unsalted canned beans. Dairy Low-fat dairy products, such as skim or 1% milk, 2% or reduced-fat cheeses, low-fat ricotta or cottage cheese, or plain low-fat yogurt. Low-sodium or reduced-sodium cheeses. Fats and Oils Tub margarines without trans fats. Light or reduced-fat mayonnaise and salad dressings (reduced sodium). Avocado. Safflower, olive, or canola oils. Natural peanut or almond butter. Other Unsalted popcorn and pretzels. The items listed above may not be a complete list of recommended foods or beverages. Contact your dietitian for more options. WHAT FOODS ARE NOT RECOMMENDED? Grains White bread. White pasta. White rice. Refined cornbread. Bagels and croissants. Crackers that contain trans fat. Vegetables Creamed or fried vegetables. Vegetables in a cheese sauce. Regular canned vegetables. Regular canned tomato sauce and paste. Regular tomato and vegetable juices. Fruits Dried fruits. Canned fruit in light or heavy syrup. Fruit juice. Meat and Other Protein Products Fatty cuts of meat. Ribs,  chicken wings, bacon, sausage, bologna, salami, chitterlings, fatback, hot dogs, bratwurst, and packaged luncheon meats. Salted nuts and seeds. Canned beans with  salt. Dairy Whole or 2% milk, cream, half-and-half, and cream cheese. Whole-fat or sweetened yogurt. Full-fat cheeses or blue cheese. Nondairy creamers and whipped toppings. Processed cheese, cheese spreads, or cheese curds. Condiments Onion and garlic salt, seasoned salt, table salt, and sea salt. Canned and packaged gravies. Worcestershire sauce. Tartar sauce. Barbecue sauce. Teriyaki sauce. Soy sauce, including reduced sodium. Steak sauce. Fish sauce. Oyster sauce. Cocktail sauce. Horseradish. Ketchup and mustard. Meat flavorings and tenderizers. Bouillon cubes. Hot sauce. Tabasco sauce. Marinades. Taco seasonings. Relishes. Fats and Oils Butter, stick margarine, lard, shortening, ghee, and bacon fat. Coconut, palm kernel, or palm oils. Regular salad dressings. Other Pickles and olives. Salted popcorn and pretzels. The items listed above may not be a complete list of foods and beverages to avoid. Contact your dietitian for more information. WHERE CAN I FIND MORE INFORMATION? National Heart, Lung, and Blood Institute: travelstabloid.com   This information is not intended to replace advice given to you by your health care provider. Make sure you discuss any questions you have with your health care provider.   Document Released: 01/13/2011 Document Revised: 02/14/2014 Document Reviewed: 11/28/2012 Elsevier Interactive Patient Education [employer].

## 2015-04-13 NOTE — Assessment & Plan Note (Signed)
Tolerating statin well. Will repeat lipid panel and CMP today.

## 2015-04-13 NOTE — Progress Notes (Signed)
Patient presents to clinic today for 3 month follow-up of hypertension and hyperlipidemia. Endorses taking medications as directed. Patient denies chest pain, palpitations, lightheadedness, dizziness, vision changes or frequent headaches.  BP Readings from Last 3 Encounters:  04/13/15 136/80  02/05/15 130/70  01/06/15 130/80   Is requesting referral to Dermatology for skin examination. Would like [place] provider.  Past Medical History  Diagnosis Date  . Hypertension   . Hyperlipidemia   . Diabetes [facility])     Current Outpatient Prescriptions on File Prior to Visit  Medication Sig Dispense Refill  . aspirin 81 MG tablet Take 81 mg by mouth daily.    Marland Kitchen atorvastatin (LIPITOR) 20 MG tablet TAKE ONE TABLET BY MOUTH ONCE DAILY 30 tablet 5  . lisinopril-hydrochlorothiazide (PRINZIDE,ZESTORETIC) 20-12.5 MG tablet TAKE ONE TABLET BY MOUTH ONCE DAILY 30 tablet 5  . Vitamin D, Ergocalciferol, (DRISDOL) 50000 UNITS CAPS capsule Take 1 capsule (50,000 Units total) by mouth every 7 (seven) days. (Patient not taking: Reported on 04/13/2015) 12 capsule 0   No current facility-administered medications on file prior to visit.    Allergies  Allergen Reactions  . Metformin And Related Rash    Caused Exacerbation in Psoriasis [skin dry & peeled]    Family History  Problem Relation Age of Onset  . Hypertension Mother     Living  . Arthritis Mother   . Stroke Father 51    Deceased  . Heart disease Sister     #1  . Hypertension Sister     #5  . Diabetes Brother     Deceased  . Stroke Brother     #2  . Alcohol abuse Brother     #2  . Alcohol abuse Father   . Throat cancer Brother     #3  . Arthritis/Rheumatoid Daughter   . Healthy Daughter     #2  . Alcohol abuse Son   . Heart attack Neg Hx     Social History   Social History  . Marital Status: Married    Spouse Name: N/A  . Number of Children: N/A  . Years of Education: N/A   Social History Main Topics  . Smoking  status: Former Games developer  . Smokeless tobacco: Never Used     Comment: quit when he was 20s, only smoked for 5 yrs  . Alcohol Use: No  . Drug Use: No  . Sexual Activity: Not Asked   Other Topics Concern  . None   Social History Narrative    Review of Systems - See HPI.  All other ROS are negative.  BP 136/80 mmHg  Pulse 54  Temp(Src) 98.2 F (36.8 C) (Oral)  Ht 5' 9.5" (1.765 m)  Wt 220 lb 12.8 oz (100.154 kg)  BMI 32.15 kg/m2  SpO2 98%  Physical Exam  Constitutional: He is oriented to person, place, and time and well-developed, well-nourished, and in no distress.  HENT:  Head: Normocephalic and atraumatic.  Eyes: Conjunctivae are normal.  Cardiovascular: Normal rate, regular rhythm, normal heart sounds and intact distal pulses.   Pulmonary/Chest: Effort normal and breath sounds normal. No respiratory distress. He has no wheezes. He has no rales. He exhibits no tenderness.  Neurological: He is alert and oriented to person, place, and time.  Skin: Skin is warm and dry. No rash noted.  Psychiatric: Affect normal.  Vitals reviewed.   Recent Results (from the past 2160 hour(s))  Hepatic function panel     Status: Abnormal  Collection Time: 01/16/15  9:26 AM  Result Value Ref Range   Total Bilirubin 1.1 0.2 - 1.2 mg/dL   Bilirubin, Direct 0.2 0.0 - 0.3 mg/dL   Alkaline Phosphatase 40 39 - 117 U/L   AST 43 (H) 0 - 37 U/L   ALT 51 0 - 53 U/L   Total Protein 7.2 6.0 - 8.3 g/dL   Albumin 3.7 3.5 - 5.2 g/dL  Urinalysis, Routine w reflex microscopic     Status: Abnormal   Collection Time: 01/16/15  9:26 AM  Result Value Ref Range   Color, Urine YELLOW Yellow;Lt. Yellow   APPearance CLEAR Clear   Specific Gravity, Urine 1.020 1.000-1.030   pH 7.0 5.0 - 8.0   Total Protein, Urine NEGATIVE Negative   Urine Glucose NEGATIVE Negative   Ketones, ur NEGATIVE Negative   Bilirubin Urine NEGATIVE Negative   Hgb urine dipstick NEGATIVE Negative   Urobilinogen, UA 1.0 0.0 - 1.0    Leukocytes, UA NEGATIVE Negative   Nitrite NEGATIVE Negative   RBC / HPF 0-2/hpf 0-2/hpf   Mucus, UA Presence of (A) None   Squamous Epithelial / LPF Rare(0-4/hpf) Rare(0-4/hpf)   Renal Epithel, UA Rare(0-4/hpf) (A) None  Vitamin D (25 hydroxy)     Status: Abnormal   Collection Time: 01/16/15  9:26 AM  Result Value Ref Range   VITD 17.37 (L) 30.00 - 100.00 ng/mL  Echo stress     Status: None   Collection Time: 02/17/15  3:27 PM  Result Value Ref Range   Estimated workload 1.0 METS   Phase 1 name PRETEST    Stage 1 Name SITTING    Stage 1 Time 00:10:49    Stage 1 Speed 0.0 mph   Stage 1 Grade 0.0 %   Stage 1 HR 66 bpm   Stage 1 SBP 139 mmHg   Stage 1 DBP 91 mmHg   Phase 2 Name Exercise    Stage 2 Name STAGE 1    Stage 2 Attribute Baseline    Stage 2 Time 00:00:01    Stage 2 Speed 0.0 mph   Stage 2 Grade 0.2 %   Stage 2 HR 66 bpm   Phase 3 Name Exercise    Stage 3 Name STAGE 1    Stage 3 Time 00:03:00    Stage 3 Speed 1.7 mph   Stage 3 Grade 10.0 %   Stage 3 HR 104 bpm   Phase 4 Name Exercise    Stage 4 Name STAGE 2    Stage 4 Time 00:03:00    Stage 4 Speed 2.5 mph   Stage 4 Grade 12.0 %   Stage 4 HR 130 bpm   Stage 4 SBP 203 mmHg   Stage 4 DBP 105 mmHg   Phase 5 Name Exercise    Stage 5 Name STAGE 3    Stage 5 Attribute Peak    Stage 5 Time 00:02:24    Stage 5 HR 150 bpm   Phase 6 Name Recovery    Stage 6 Attribute Recovery 1min    Stage 6 Time 00:01:01    Stage 6 Speed 0.0 mph   Stage 6 Grade 3.8 %   Stage 6 HR 115 bpm   Phase 7 Name Recovery    Stage 7 Time 00:07:44    Stage 7 Speed 0.0 mph   Stage 7 Grade 0.0 %   Stage 7 HR 87 bpm   Stage 7 SBP 136 mmHg   Stage 7 DBP 79  mmHg   Peak HR 150 BPM   Peak BP  mmHg   Percent of predicted max HR 96 %    Assessment/Plan: Essential hypertension, benign Stable. Asymptomatic. Will check CMP today. Continue current regimen. Follow-up with Cardiology as scheduled.  Hyperlipidemia Tolerating statin well. Will  repeat lipid panel and CMP today.  Diabetes mellitus type 2, controlled, without complications Has been very well controlled without medications. Will repeat A1C today. Patient to schedule appointment with Ophthalmology.

## 2015-04-13 NOTE — Assessment & Plan Note (Signed)
Stable. Asymptomatic. Will check CMP today. Continue current regimen. Follow-up with Cardiology as scheduled.

## 2015-04-13 NOTE — Assessment & Plan Note (Signed)
Has been very well controlled without medications. Will repeat A1C today. Patient to schedule appointment with Ophthalmology.

## 2015-04-13 NOTE — Progress Notes (Signed)
Pre visit review using our clinic review tool, if applicable. No additional management support is needed unless otherwise documented below in the visit note. 

## 2015-04-15 ENCOUNTER — Encounter: Payer: Self-pay | Admitting: Physician Assistant

## 2015-09-21 ENCOUNTER — Telehealth: Payer: Self-pay | Admitting: Physician Assistant

## 2015-09-21 MED ORDER — ATORVASTATIN CALCIUM 20 MG PO TABS: 20.0000 mg | ORAL_TABLET | Freq: Every day | ORAL | 0 refills | Status: DC

## 2015-09-21 MED ORDER — LISINOPRIL-HYDROCHLOROTHIAZIDE 20-12.5 MG PO TABS: 1.0000 | ORAL_TABLET | Freq: Every day | ORAL | 0 refills | Status: DC

## 2015-09-21 NOTE — Telephone Encounter (Signed)
Medication refill for 2 medications.    1. atorvastatin  2. lisinopril   Pharmacy: [id number] - [place], [place] - [address]   ALSO, pt has scheduled follow up with PCP on 09/30/15 at 9:15a.

## 2015-09-21 NOTE — Telephone Encounter (Signed)
Refills have been sent.  

## 2015-09-30 ENCOUNTER — Encounter: Payer: Self-pay | Admitting: Physician Assistant

## 2015-09-30 ENCOUNTER — Ambulatory Visit (INDEPENDENT_AMBULATORY_CARE_PROVIDER_SITE_OTHER): Payer: Commercial Managed Care - HMO | Admitting: Physician Assistant

## 2015-09-30 VITALS — BP 146/88 | HR 59 | Temp 98.3°F | Resp 16 | Ht 70.0 in | Wt 220.5 lb

## 2015-09-30 DIAGNOSIS — R51 Headache: Secondary | ICD-10-CM

## 2015-09-30 DIAGNOSIS — I10 Essential (primary) hypertension: Secondary | ICD-10-CM

## 2015-09-30 DIAGNOSIS — R519 Headache, unspecified: Secondary | ICD-10-CM | POA: Insufficient documentation

## 2015-09-30 DIAGNOSIS — E119 Type 2 diabetes mellitus without complications: Secondary | ICD-10-CM | POA: Diagnosis not present

## 2015-09-30 DIAGNOSIS — E785 Hyperlipidemia, unspecified: Secondary | ICD-10-CM

## 2015-09-30 LAB — COMPREHENSIVE METABOLIC PANEL
ALT: 49 U/L (ref 0–53)
AST: 44 U/L — ABNORMAL HIGH (ref 0–37)
Albumin: 4.1 g/dL (ref 3.5–5.2)
Alkaline Phosphatase: 41 U/L (ref 39–117)
BUN: 11 mg/dL (ref 6–23)
CALCIUM: 9.3 mg/dL (ref 8.4–10.5)
CO2: 29 meq/L (ref 19–32)
CREATININE: 1.02 mg/dL (ref 0.40–1.50)
Chloride: 103 mEq/L (ref 96–112)
GFR: 94.13 mL/min (ref >60.00)
GLUCOSE: 135 mg/dL — AB (ref 70–99)
POTASSIUM: 3.5 meq/L (ref 3.5–5.1)
Sodium: 139 mEq/L (ref 135–145)
Total Bilirubin: 1.5 mg/dL — ABNORMAL HIGH (ref 0.2–1.2)
Total Protein: 7.8 g/dL (ref 6.0–8.3)

## 2015-09-30 LAB — HEMOGLOBIN A1C: Hgb A1c MFr Bld: 7.3 % — ABNORMAL HIGH (ref 4.6–6.5)

## 2015-09-30 MED ORDER — LISINOPRIL-HYDROCHLOROTHIAZIDE 20-25 MG PO TABS
1.0000 | ORAL_TABLET | Freq: Every day | ORAL | 3 refills | Status: DC
Start: 2015-09-30 — End: 2016-01-18

## 2015-09-30 NOTE — Assessment & Plan Note (Signed)
Mild. Very infrequent. Exam unremarkable. Associated PND. Discussed use of over-the-counter Flonase. Humidifier in bedroom. Follow-up if not improving.

## 2015-09-30 NOTE — Assessment & Plan Note (Signed)
Diabetic foot exam updated. Within normal limits. Discussed need for referral to ophthalmology. Patient declines at present. Declines immunization today. Last A1c of 7.1. Goal for his age is less than 7.5. We'll repeat labs today. A1c rising we'll need to start medication.

## 2015-09-30 NOTE — Assessment & Plan Note (Signed)
BP above goal, especially for diabetic. Asymptomatic. We'll increase lisinopril-hydrochlorothiazide to 20-25 milligrams dose daily. DASH diet encouraged. Will obtain labs today. Patient to follow-up for a nurse visit in 4 weeks to reassess blood pressure. We'll check a repeat BMP at that time.

## 2015-09-30 NOTE — Patient Instructions (Signed)
Please go to the lab for blood work. I will call you with your results.  Please start the new dose of BP medication as directed. Follow-up in 1 month for BP recheck.  Continue all other medications as directed.  Get some over-the-counter Flonase to use as needed for nasal drainage and sinus headache. A humidifier in the bedroom will also be beneficial.  DASH Eating Plan DASH stands for "Dietary Approaches to Stop Hypertension." The DASH eating plan is a healthy eating plan that has been shown to reduce high blood pressure (hypertension). Additional health benefits may include reducing the risk of type 2 diabetes mellitus, heart disease, and stroke. The DASH eating plan may also help with weight loss. WHAT DO I NEED TO KNOW ABOUT THE DASH EATING PLAN? For the DASH eating plan, you will follow these general guidelines:  Choose foods with a percent daily value for sodium of less than 5% (as listed on the food label).  Use salt-free seasonings or herbs instead of table salt or sea salt.  Check with your health care provider or pharmacist before using salt substitutes.  Eat lower-sodium products, often labeled as "lower sodium" or "no salt added."  Eat fresh foods.  Eat more vegetables, fruits, and low-fat dairy products.  Choose whole grains. Look for the word "whole" as the first word in the ingredient list.  Choose fish and skinless chicken or [place] more often than red meat. Limit fish, poultry, and meat to 6 oz (170 g) each day.  Limit sweets, desserts, sugars, and sugary drinks.  Choose heart-healthy fats.  Limit cheese to 1 oz (28 g) per day.  Eat more home-cooked food and less restaurant, buffet, and fast food.  Limit fried foods.  Cook foods using methods other than frying.  Limit canned vegetables. If you do use them, rinse them well to decrease the sodium.  When eating at a restaurant, ask that your food be prepared with less salt, or no salt if possible. WHAT  FOODS CAN I EAT? Seek help from a dietitian for individual calorie needs. Grains Whole grain or whole wheat bread. Brown rice. Whole grain or whole wheat pasta. Quinoa, bulgur, and whole grain cereals. Low-sodium cereals. Corn or whole wheat flour tortillas. Whole grain cornbread. Whole grain crackers. Low-sodium crackers. Vegetables Fresh or frozen vegetables (raw, steamed, roasted, or grilled). Low-sodium or reduced-sodium tomato and vegetable juices. Low-sodium or reduced-sodium tomato sauce and paste. Low-sodium or reduced-sodium canned vegetables.  Fruits All fresh, canned (in natural juice), or frozen fruits. Meat and Other Protein Products Ground beef (85% or leaner), grass-fed beef, or beef trimmed of fat. Skinless chicken or [place]. Ground chicken or [place]. Pork trimmed of fat. All fish and seafood. Eggs. Dried beans, peas, or lentils. Unsalted nuts and seeds. Unsalted canned beans. Dairy Low-fat dairy products, such as skim or 1% milk, 2% or reduced-fat cheeses, low-fat ricotta or cottage cheese, or plain low-fat yogurt. Low-sodium or reduced-sodium cheeses. Fats and Oils Tub margarines without trans fats. Light or reduced-fat mayonnaise and salad dressings (reduced sodium). Avocado. Safflower, olive, or canola oils. Natural peanut or almond butter. Other Unsalted popcorn and pretzels. The items listed above may not be a complete list of recommended foods or beverages. Contact your dietitian for more options. WHAT FOODS ARE NOT RECOMMENDED? Grains White bread. White pasta. White rice. Refined cornbread. Bagels and croissants. Crackers that contain trans fat. Vegetables Creamed or fried vegetables. Vegetables in a cheese sauce. Regular canned vegetables. Regular canned tomato sauce and paste.  Regular tomato and vegetable juices. Fruits Dried fruits. Canned fruit in light or heavy syrup. Fruit juice. Meat and Other Protein Products Fatty cuts of meat. Ribs, chicken wings, bacon,  sausage, bologna, salami, chitterlings, fatback, hot dogs, bratwurst, and packaged luncheon meats. Salted nuts and seeds. Canned beans with salt. Dairy Whole or 2% milk, cream, half-and-half, and cream cheese. Whole-fat or sweetened yogurt. Full-fat cheeses or blue cheese. Nondairy creamers and whipped toppings. Processed cheese, cheese spreads, or cheese curds. Condiments Onion and garlic salt, seasoned salt, table salt, and sea salt. Canned and packaged gravies. Worcestershire sauce. Tartar sauce. Barbecue sauce. Teriyaki sauce. Soy sauce, including reduced sodium. Steak sauce. Fish sauce. Oyster sauce. Cocktail sauce. Horseradish. Ketchup and mustard. Meat flavorings and tenderizers. Bouillon cubes. Hot sauce. Tabasco sauce. Marinades. Taco seasonings. Relishes. Fats and Oils Butter, stick margarine, lard, shortening, ghee, and bacon fat. Coconut, palm kernel, or palm oils. Regular salad dressings. Other Pickles and olives. Salted popcorn and pretzels. The items listed above may not be a complete list of foods and beverages to avoid. Contact your dietitian for more information. WHERE CAN I FIND MORE INFORMATION? National Heart, Lung, and Blood Institute: travelstabloid.com   This information is not intended to replace advice given to you by your health care provider. Make sure you discuss any questions you have with your health care provider.   Document Released: 01/13/2011 Document Revised: 02/14/2014 Document Reviewed: 11/28/2012 Elsevier Interactive Patient Education [employer].

## 2015-09-30 NOTE — Assessment & Plan Note (Signed)
Continue statin and 81 mg ASA daily. Dietary and exercise  recommendations discussed.

## 2015-09-30 NOTE — Progress Notes (Signed)
Pre visit review using our clinic review tool, if applicable. No additional management support is needed unless otherwise documented below in the visit note/SLS  

## 2015-09-30 NOTE — Progress Notes (Signed)
Patient presents to clinic today for follow-up. Also has acute concerns he would like to address.  Hypertension -- Patient currently on combination of lisinopril and HCTZ. Endorses taking medications daily as directed. Patient denies chest pain, palpitations, lightheadedness, dizziness, vision changes.  BP Readings from Last 3 Encounters:  09/30/15 (!) 146/88  04/13/15 136/80  02/05/15 130/70   Hyperlipidemia -- Currently on lipitor 20 mg daily. Is taking as directed. Denies myalgias. Is taking 81 mg ASA daily.   Lab Results  Component Value Date   LDLCALC 92 04/13/2015   Diabetes Mellitus II -- Currently controlled with diet and exercise. A1C teeters between 6.7-7.1.  Is due for foot exam. Denies concerns today. Is also overdue for Diabetic Eye Examination. Declines referral. Denies changes in vision.  Lab Results  Component Value Date   HGBA1C 7.1 (H) 04/13/2015   Patient endorses frontal headaches occurring on a rare basis. Mostly developing as the day goes on. Denies nausea or vomiting. Denies photophobia or phonophobia. Does note PND intermittently and sinus pressure with headaches. Headaches occurring once every couple of weeks.   Past Medical History:  Diagnosis Date  . Diabetes (HCC)   . Hyperlipidemia   . Hypertension     Current Outpatient Prescriptions on File Prior to Visit  Medication Sig Dispense Refill  . aspirin 81 MG tablet Take 81 mg by mouth daily.    Marland Kitchen. atorvastatin (LIPITOR) 20 MG tablet Take 1 tablet (20 mg total) by mouth daily. 30 tablet 0   No current facility-administered medications on file prior to visit.     Allergies  Allergen Reactions  . Metformin And Related Rash    Caused Exacerbation in Psoriasis [skin dry & peeled]    Family History  Problem Relation Age of Onset  . Hypertension Mother     Living  . Arthritis Mother   . Stroke Father [id number]    Deceased  . Alcohol abuse Father   . Heart disease Sister     #1  . Hypertension  Sister     #5  . Diabetes Brother     Deceased  . Stroke Brother     #2  . Alcohol abuse Brother     #2  . Throat cancer Brother     #3  . Arthritis/Rheumatoid Daughter   . Healthy Daughter     #2  . Alcohol abuse Son   . Heart attack Neg Hx     Social History   Social History  . Marital status: Married    Spouse name: N/A  . Number of children: N/A  . Years of education: N/A   Social History Main Topics  . Smoking status: Former Games developermoker  . Smokeless tobacco: Never Used     Comment: quit when he was 20s, only smoked for 5 yrs  . Alcohol use No  . Drug use: No  . Sexual activity: Not Asked   Other Topics Concern  . None   Social History Narrative  . None   Review of Systems - See HPI.  All other ROS are negative.  BP (!) 146/88 (BP Location: Right Arm, Cuff Size: Large)   Pulse (!) 59   Temp 98.3 F (36.8 C) (Oral)   Resp 16   Ht 5\' 10"  (1.778 m)   Wt 220 lb 8 oz (100 kg)   SpO2 99%   BMI 31.64 kg/m   Physical Exam  Constitutional: He is oriented to person, place, and time and well-developed,  well-nourished, and in no distress.  HENT:  Head: Normocephalic and atraumatic.  Right Ear: External ear normal.  Left Ear: External ear normal.  Mouth/Throat: Oropharynx is clear and moist.  Eyes: Conjunctivae are normal. Pupils are equal, round, and reactive to light.  Neck: Neck supple.  Cardiovascular: Normal rate, regular rhythm, normal heart sounds and intact distal pulses.   Pulmonary/Chest: Effort normal and breath sounds normal. No respiratory distress. He has no wheezes. He has no rales. He exhibits no tenderness.  Neurological: He is alert and oriented to person, place, and time.  Skin: Skin is warm and dry. No rash noted.  Psychiatric: Affect normal.  Vitals reviewed.  Diabetic Foot Form - Detailed   Diabetic Foot Exam - detailed Diabetic Foot exam was performed with the following findings:  Yes 09/30/2015  9:52 AM  Visual Foot Exam completed.:  Yes   Is there a history of foot ulcer?:  No Can the patient see the bottom of their feet?:  Yes Are the shoes appropriate in style and fit?:  Yes Is there swelling or and abnormal foot shape?:  No Are the toenails long?:  No Are the toenails thick?:  No Do you have pain in calf while walking?:  No Is there a claw toe deformity?:  No Is there elevated skin temparature?:  No Is there limited skin dorsiflexion?:  No Is there foot or ankle muscle weakness?:  No Are the toenails ingrown?:  No Normal Range of Motion:  Yes Pulse Foot Exam completed.:  Yes  Right posterior Tibialias:  Present Left posterior Tibialias:  Present  Right Dorsalis Pedis:  Present Left Dorsalis Pedis:  Present  Sensory Foot Exam Completed.:  Yes Swelling:  No Semmes-Weinstein Monofilament Test R Foot Test Control:  Neg L Foot Test Control:  Neg  R Site 1-Great Toe:  Neg L Site 1-Great Toe:  Neg  R Site 4:  Neg L Site 4:  Neg  R Site 5:  Neg L Site 5:  Neg    Comments:  Monofilament testing performed -- sensation intact.     Assessment/Plan: Sinus headache Mild. Very infrequent. Exam unremarkable. Associated PND. Discussed use of over-the-counter Flonase. Humidifier in bedroom. Follow-up if not improving.  Hyperlipidemia Continue statin and 81 mg ASA daily. Dietary and exercise  recommendations discussed.  Essential hypertension, benign BP above goal, especially for diabetic. Asymptomatic. We'll increase lisinopril-hydrochlorothiazide to 20-25 milligrams dose daily. DASH diet encouraged. Will obtain labs today. Patient to follow-up for a nurse visit in 4 weeks to reassess blood pressure. We'll check a repeat BMP at that time.  Diabetes mellitus type 2, controlled, without complications Diabetic foot exam updated. Within normal limits. Discussed need for referral to ophthalmology. Patient declines at present. Declines immunization today. Last A1c of 7.1. Goal for his age is less than 7.5. We'll repeat labs today.  A1c rising we'll need to start medication.    Piedad ClimesMartin, Asucena Galer Cody, PA-C

## 2015-11-03 ENCOUNTER — Ambulatory Visit (INDEPENDENT_AMBULATORY_CARE_PROVIDER_SITE_OTHER): Payer: Commercial Managed Care - HMO | Admitting: Physician Assistant

## 2015-11-03 VITALS — BP 139/82 | HR 63

## 2015-11-03 DIAGNOSIS — I10 Essential (primary) hypertension: Secondary | ICD-10-CM | POA: Diagnosis not present

## 2015-11-03 LAB — BASIC METABOLIC PANEL
BUN: 15 mg/dL (ref 6–23)
CALCIUM: 9.7 mg/dL (ref 8.4–10.5)
CO2: 34 mEq/L — ABNORMAL HIGH (ref 19–32)
CREATININE: 1.04 mg/dL (ref 0.40–1.50)
Chloride: 97 mEq/L (ref 96–112)
GFR: 92.02 mL/min (ref >60.00)
GLUCOSE: 123 mg/dL — AB (ref 70–99)
POTASSIUM: 4.1 meq/L (ref 3.5–5.1)
Sodium: 137 mEq/L (ref 135–145)

## 2015-11-03 NOTE — Progress Notes (Signed)
RN note reviewed. Plan is as stated.  Takira Sherrin Cody, PA-C  

## 2015-11-03 NOTE — Progress Notes (Signed)
Pre visit review using our clinic review tool, if applicable. No additional management support is needed unless otherwise documented below in the visit note.  Patient presents in office for blood pressure check per OV note 09/30/15. Reviewed medication & current regimen with the patient. Today's readings were as follow: BP 141/76 P 60 & BP 139/82 P 63.  Per Malva Coganody Martin, PA-C: Complete BMP lab work today. Continue current medication regimen and watch salt, fried and processed food intake. Incorporate daily exercise and return in 1 month for an office visit with PCP.   Informed patient of the provider's instructions. He verbalized understanding and did not have any questions or concerns prior to leaving the nurse visit.  Next appointment scheduled for 12/04/15 at 7:00 AM.

## 2015-11-03 NOTE — Patient Instructions (Addendum)
Per Malva Coganody Martin, PA-C: Complete BMP lab work today. Continue current medication regimen and watch salt, fried and processed food intake. Incorporate daily exercise and return in 1 month for an office visit with PCP.

## 2015-12-04 ENCOUNTER — Encounter: Payer: Self-pay | Admitting: Physician Assistant

## 2015-12-04 ENCOUNTER — Ambulatory Visit (INDEPENDENT_AMBULATORY_CARE_PROVIDER_SITE_OTHER): Payer: Commercial Managed Care - HMO | Admitting: Physician Assistant

## 2015-12-04 VITALS — BP 130/76 | HR 58 | Temp 97.8°F | Wt 217.0 lb

## 2015-12-04 DIAGNOSIS — I10 Essential (primary) hypertension: Secondary | ICD-10-CM

## 2015-12-04 NOTE — Progress Notes (Signed)
Pre visit review using our clinic review tool, if applicable. No additional management support is needed unless otherwise documented below in the visit note. 

## 2015-12-04 NOTE — Progress Notes (Signed)
Patient presents to clinic today for follow-up of hypertension.  Patient currently on a regimen of lisinopril-HCTZ 20-25 mg. Endorses taking daily as directed. Endorses well-balanced diet and good hydration. Has lost 3 pounds on new walking regimen. Patient denies chest pain, palpitations, lightheadedness, dizziness, vision changes or frequent headaches.  BP Readings from Last 3 Encounters:  12/04/15 130/76  11/03/15 139/82  09/30/15 (!) 146/88    Past Medical History:  Diagnosis Date  . Diabetes (HCC)   . Hyperlipidemia   . Hypertension     Current Outpatient Prescriptions on File Prior to Visit  Medication Sig Dispense Refill  . aspirin 81 MG tablet Take 81 mg by mouth daily.    Marland Kitchen. lisinopril-hydrochlorothiazide (PRINZIDE,ZESTORETIC) 20-25 MG tablet Take 1 tablet by mouth daily. 30 tablet 3  . atorvastatin (LIPITOR) 20 MG tablet Take 1 tablet (20 mg total) by mouth daily. (Patient not taking: Reported on 12/04/2015) 30 tablet 0   No current facility-administered medications on file prior to visit.     Allergies  Allergen Reactions  . Metformin And Related Rash    Caused Exacerbation in Psoriasis [skin dry & peeled]    Family History  Problem Relation Age of Onset  . Hypertension Mother     Living  . Arthritis Mother   . Stroke Father [id number]    Deceased  . Alcohol abuse Father   . Heart disease Sister     #1  . Hypertension Sister     #5  . Diabetes Brother     Deceased  . Stroke Brother     #2  . Alcohol abuse Brother     #2  . Throat cancer Brother     #3  . Arthritis/Rheumatoid Daughter   . Healthy Daughter     #2  . Alcohol abuse Son   . Heart attack Neg Hx     Social History   Social History  . Marital status: Married    Spouse name: N/A  . Number of children: N/A  . Years of education: N/A   Social History Main Topics  . Smoking status: Former Games developermoker  . Smokeless tobacco: Never Used     Comment: quit when he was 20s, only smoked for 5 yrs  .  Alcohol use No  . Drug use: No  . Sexual activity: Not Asked   Other Topics Concern  . None   Social History Narrative  . None   Review of Systems - See HPI.  All other ROS are negative.  BP 130/76 (BP Location: Left Arm, Patient Position: Sitting, Cuff Size: Large)   Pulse (!) 58   Temp 97.8 F (36.6 C) (Oral)   Wt 217 lb (98.4 kg)   SpO2 99%   BMI 31.14 kg/m   Physical Exam  Constitutional: He is oriented to person, place, and time and well-developed, well-nourished, and in no distress.  HENT:  Head: Normocephalic and atraumatic.  Eyes: Conjunctivae are normal.  Neck: Neck supple.  Cardiovascular: Normal rate, regular rhythm, normal heart sounds and intact distal pulses.   Pulmonary/Chest: Effort normal and breath sounds normal. No respiratory distress. He has no wheezes. He has no rales. He exhibits no tenderness.  Neurological: He is alert and oriented to person, place, and time.  Skin: Skin is warm and dry. No rash noted.  Psychiatric: Affect normal.  Vitals reviewed.   Recent Results (from the past 2160 hour(s))  Comprehensive metabolic panel     Status: Abnormal   Collection  Time: 09/30/15 10:02 AM  Result Value Ref Range   Sodium 139 135 - 145 mEq/L   Potassium 3.5 3.5 - 5.1 mEq/L   Chloride 103 96 - 112 mEq/L   CO2 29 19 - 32 mEq/L   Glucose, Bld 135 (H) 70 - 99 mg/dL   BUN 11 6 - 23 mg/dL   Creatinine, Ser 1.61 0.40 - 1.50 mg/dL   Total Bilirubin 1.5 (H) 0.2 - 1.2 mg/dL   Alkaline Phosphatase 41 39 - 117 U/L   AST 44 (H) 0 - 37 U/L   ALT 49 0 - 53 U/L   Total Protein 7.8 6.0 - 8.3 g/dL   Albumin 4.1 3.5 - 5.2 g/dL   Calcium 9.3 8.4 - 09.6 mg/dL   GFR 04.54 >09.81 mL/min  Hemoglobin A1c     Status: Abnormal   Collection Time: 09/30/15 10:02 AM  Result Value Ref Range   Hgb A1c MFr Bld 7.3 (H) 4.6 - 6.5 %    Comment: Glycemic Control Guidelines for People with Diabetes:Non Diabetic:  <6%Goal of Therapy: <7%Additional Action Suggested:  >8%   Basic  metabolic panel     Status: Abnormal   Collection Time: 11/03/15  9:49 AM  Result Value Ref Range   Sodium 137 135 - 145 mEq/L   Potassium 4.1 3.5 - 5.1 mEq/L   Chloride 97 96 - 112 mEq/L   CO2 34 (H) 19 - 32 mEq/L   Glucose, Bld 123 (H) 70 - 99 mg/dL   BUN 15 6 - 23 mg/dL   Creatinine, Ser 1.91 0.40 - 1.50 mg/dL   Calcium 9.7 8.4 - 47.8 mg/dL   GFR 29.56 >21.30 mL/min   Assessment/Plan: Essential hypertension, benign BP improved. Continue current medications regimen. Dietary and exercise regimens reviewed. Will keep at least 150 minutes of aerobic exercise per week. Increase water intake.    Piedad Climes, PA-C

## 2015-12-04 NOTE — Assessment & Plan Note (Signed)
BP improved. Continue current medications regimen. Dietary and exercise regimens reviewed. Will keep at least 150 minutes of aerobic exercise per week. Increase water intake.

## 2015-12-04 NOTE — Patient Instructions (Signed)
Please keep up with well-balanced diet and exercise regimen. Add on a small G2 gatorade with workouts to help prevent muscle cramping. Also stretch before exercising. Continue medications as directed. Follow-up in 3 months.  DASH Eating Plan DASH stands for "Dietary Approaches to Stop Hypertension." The DASH eating plan is a healthy eating plan that has been shown to reduce high blood pressure (hypertension). Additional health benefits may include reducing the risk of type 2 diabetes mellitus, heart disease, and stroke. The DASH eating plan may also help with weight loss. WHAT DO I NEED TO KNOW ABOUT THE DASH EATING PLAN? For the DASH eating plan, you will follow these general guidelines:  Choose foods with a percent daily value for sodium of less than 5% (as listed on the food label).  Use salt-free seasonings or herbs instead of table salt or sea salt.  Check with your health care provider or pharmacist before using salt substitutes.  Eat lower-sodium products, often labeled as "lower sodium" or "no salt added."  Eat fresh foods.  Eat more vegetables, fruits, and low-fat dairy products.  Choose whole grains. Look for the word "whole" as the first word in the ingredient list.  Choose fish and skinless chicken or [place] more often than red meat. Limit fish, poultry, and meat to 6 oz (170 g) each day.  Limit sweets, desserts, sugars, and sugary drinks.  Choose heart-healthy fats.  Limit cheese to 1 oz (28 g) per day.  Eat more home-cooked food and less restaurant, buffet, and fast food.  Limit fried foods.  Cook foods using methods other than frying.  Limit canned vegetables. If you do use them, rinse them well to decrease the sodium.  When eating at a restaurant, ask that your food be prepared with less salt, or no salt if possible. WHAT FOODS CAN I EAT? Seek help from a dietitian for individual calorie needs. Grains Whole grain or whole wheat bread. Brown rice. Whole  grain or whole wheat pasta. Quinoa, bulgur, and whole grain cereals. Low-sodium cereals. Corn or whole wheat flour tortillas. Whole grain cornbread. Whole grain crackers. Low-sodium crackers. Vegetables Fresh or frozen vegetables (raw, steamed, roasted, or grilled). Low-sodium or reduced-sodium tomato and vegetable juices. Low-sodium or reduced-sodium tomato sauce and paste. Low-sodium or reduced-sodium canned vegetables.  Fruits All fresh, canned (in natural juice), or frozen fruits. Meat and Other Protein Products Ground beef (85% or leaner), grass-fed beef, or beef trimmed of fat. Skinless chicken or [place]key. Ground chicken or [place]key. Pork trimmed of fat. All fish and seafood. Eggs. Dried beans, peas, or lentils. Unsalted nuts and seeds. Unsalted canned beans. Dairy Low-fat dairy products, such as skim or 1% milk, 2% or reduced-fat cheeses, low-fat ricotta or cottage cheese, or plain low-fat yogurt. Low-sodium or reduced-sodium cheeses. Fats and Oils Tub margarines without trans fats. Light or reduced-fat mayonnaise and salad dressings (reduced sodium). Avocado. Safflower, olive, or canola oils. Natural peanut or almond butter. Other Unsalted popcorn and pretzels. The items listed above may not be a complete list of recommended foods or beverages. Contact your dietitian for more options. WHAT FOODS ARE NOT RECOMMENDED? Grains White bread. White pasta. White rice. Refined cornbread. Bagels and croissants. Crackers that contain trans fat. Vegetables Creamed or fried vegetables. Vegetables in a cheese sauce. Regular canned vegetables. Regular canned tomato sauce and paste. Regular tomato and vegetable juices. Fruits Dried fruits. Canned fruit in light or heavy syrup. Fruit juice. Meat and Other Protein Products Fatty cuts of meat. Ribs, chicken wings, bacon,  sausage, bologna, salami, chitterlings, fatback, hot dogs, bratwurst, and packaged luncheon meats. Salted nuts and seeds. Canned beans with  salt. Dairy Whole or 2% milk, cream, half-and-half, and cream cheese. Whole-fat or sweetened yogurt. Full-fat cheeses or blue cheese. Nondairy creamers and whipped toppings. Processed cheese, cheese spreads, or cheese curds. Condiments Onion and garlic salt, seasoned salt, table salt, and sea salt. Canned and packaged gravies. Worcestershire sauce. Tartar sauce. Barbecue sauce. Teriyaki sauce. Soy sauce, including reduced sodium. Steak sauce. Fish sauce. Oyster sauce. Cocktail sauce. Horseradish. Ketchup and mustard. Meat flavorings and tenderizers. Bouillon cubes. Hot sauce. Tabasco sauce. Marinades. Taco seasonings. Relishes. Fats and Oils Butter, stick margarine, lard, shortening, ghee, and bacon fat. Coconut, palm kernel, or palm oils. Regular salad dressings. Other Pickles and olives. Salted popcorn and pretzels. The items listed above may not be a complete list of foods and beverages to avoid. Contact your dietitian for more information. WHERE CAN I FIND MORE INFORMATION? National Heart, Lung, and Blood Institute: [employer]   This information is not intended to replace advice given to you by your health care provider. Make sure you discuss any questions you have with your health care provider.   Document Released: 01/13/2011 Document Revised: 02/14/2014 Document Reviewed: 11/28/2012 Elsevier Interactive Patient Education [employer]

## 2016-01-18 ENCOUNTER — Telehealth: Payer: Self-pay | Admitting: Physician Assistant

## 2016-01-18 ENCOUNTER — Encounter: Payer: Self-pay | Admitting: Emergency Medicine

## 2016-01-18 ENCOUNTER — Other Ambulatory Visit: Payer: Self-pay | Admitting: Emergency Medicine

## 2016-01-18 DIAGNOSIS — I10 Essential (primary) hypertension: Secondary | ICD-10-CM

## 2016-01-18 MED ORDER — ATORVASTATIN CALCIUM 20 MG PO TABS: 20.0000 mg | ORAL_TABLET | Freq: Every day | ORAL | 0 refills | Status: DC

## 2016-01-18 MED ORDER — LISINOPRIL-HYDROCHLOROTHIAZIDE 20-25 MG PO TABS: 1.0000 | ORAL_TABLET | Freq: Every day | ORAL | 0 refills | Status: DC

## 2016-01-18 NOTE — Telephone Encounter (Signed)
Caller name: Relationship to patient: Self Can be reached: [phone]  [facility]:  [id number] [place], [place] - [id number] [phone] (Phone) [phone] (Fax)     Reason for call: Request refill on lisinopril-hydrochlorothiazide (PRINZIDE,ZESTORETIC) 20-25 MG tablet [[id number]][[id number]] And atorvastatin (LIPITOR) 20 MG tablet [[id number]][[id number]]

## 2016-01-18 NOTE — Telephone Encounter (Signed)
[facility]. Mailed letter advising patient to contact office to schedule a physical

## 2016-02-16 ENCOUNTER — Telehealth: Payer: Self-pay | Admitting: Physician Assistant

## 2016-02-16 DIAGNOSIS — I10 Essential (primary) hypertension: Secondary | ICD-10-CM

## 2016-02-16 MED ORDER — LISINOPRIL-HYDROCHLOROTHIAZIDE 20-25 MG PO TABS: 1.0000 | ORAL_TABLET | Freq: Every day | ORAL | 0 refills | Status: DC

## 2016-02-16 NOTE — Telephone Encounter (Signed)
Patient scheduled for 03/10/16 with PCP

## 2016-02-16 NOTE — Telephone Encounter (Signed)
02/02/15 PR PPPS,01/19/15 SUBSEQ VISIT [[id number]] patient scheduled medicare wellness appointment 03/10/16 at 9am and physical with PCP at 9:30am

## 2016-02-16 NOTE — Telephone Encounter (Signed)
Relation to NW:GNFApt:self Call back number:[phone] Pharmacy:  Reason for call:  Patient requesting a refill lisinopril-hydrochlorothiazide (PRINZIDE,ZESTORETIC) 20-25 MG tablet

## 2016-02-16 NOTE — Telephone Encounter (Signed)
OK 

## 2016-02-16 NOTE — Telephone Encounter (Signed)
Rx sent to the pharmacy until able to see new PCP

## 2016-02-16 NOTE — Telephone Encounter (Signed)
[facility],please advise

## 2016-02-16 NOTE — Telephone Encounter (Signed)
Ok with me 

## 2016-03-08 NOTE — Progress Notes (Signed)
Subjective:   Maxwell Hanson is a 66 y.o. male who presents for Medicare Annual/Subsequent preventive examination.  Review of Systems:  No ROS.  Medicare Wellness Visit. Cardiac Risk Factors include: advanced age (>39mn, >>2women);diabetes mellitus;dyslipidemia;hypertension;male gender;sedentary lifestyle Sleep patterns: Sleeps about 6-7 hrs. Feels rested. Home Safety/Smoke Alarms:  Feels safe in home. Smoke alarms in place.  Living environment; residence and Firearm Safety: Lives at home with wife  And 2 daughters and son in lSports coach 1 story. No guns. Seat Belt Safety/Bike Helmet: Wears seat belt.   Counseling:   Eye Exam-  Pt states will schedule. Order placed today. Dental- Upper and lower dentures. Gum care discussed.   Male:   CCS- Fecal immunochemical test kit ordered and sent home with pt today. PSA-  Lab Results  Component Value Date   PSA 0.52 01/06/2015   PSA 0.48 12/18/2013        Objective:    Vitals: BP 126/75 (BP Location: Left Arm, Patient Position: Sitting, Cuff Size: Large)   Pulse (!) 54   Temp 98.2 F (36.8 C) (Oral)   Ht '5\' 10"'  (1.778 m)   Wt 217 lb 12.8 oz (98.8 kg)   SpO2 99%   BMI 31.25 kg/m   Body mass index is 31.25 kg/m.  Tobacco History  Smoking Status  . Former Smoker  Smokeless Tobacco  . Never Used    Comment: quit when he was 89s only smoked for 5 yrs     Counseling given: Not Answered   Past Medical History:  Diagnosis Date  . DDD (degenerative disc disease), lumbar   . Diabetes ([place]   . Hyperlipidemia   . Hypertension    Past Surgical History:  Procedure Laterality Date  . HYDROCELE EXCISION / REPAIR    . KIDNEY STONE SURGERY  2013  . TOOTH EXTRACTION     Full Dentures   Family History  Problem Relation Age of Onset  . Hypertension Mother     Living  . Arthritis Mother   . Stroke Father 822   Deceased  . Alcohol abuse Father   . Heart disease Sister     #1  . Hypertension Sister     #5  . Diabetes Brother     Deceased  . Stroke Brother     #2  . Alcohol abuse Brother     #2  . Throat cancer Brother     #3  . Arthritis/Rheumatoid Daughter   . Healthy Daughter     #2  . Alcohol abuse Son   . Heart attack Neg Hx    History  Sexual Activity  . Sexual activity: Yes    Outpatient Encounter Prescriptions as of 03/10/2016  Medication Sig  . aspirin 81 MG tablet Take 81 mg by mouth daily.  .Marland Kitchenatorvastatin (LIPITOR) 20 MG tablet Take 1 tablet (20 mg total) by mouth daily.  .Marland Kitchenlisinopril-hydrochlorothiazide (PRINZIDE,ZESTORETIC) 20-25 MG tablet Take 1 tablet by mouth daily.   No facility-administered encounter medications on file as of 03/10/2016.     Activities of Daily Living In your present state of health, do you have any difficulty performing the following activities: 03/10/2016 03/10/2016  Hearing? N N  Vision? N N  Difficulty concentrating or making decisions? N N  Walking or climbing stairs? N N  Dressing or bathing? N N  Doing errands, shopping? N N  Preparing Food and eating ? N -  Using the Toilet? N -  In the past six months,  have you accidently leaked urine? N -  Do you have problems with loss of bowel control? N -  Managing your Medications? N -  Managing your Finances? N -  Housekeeping or managing your Housekeeping? N -  Some recent data might be hidden    Patient Care Team: Brunetta Jeans, PA-C as PCP - General (Physician Assistant)   Assessment:    Physical assessment deferred to PCP.  Exercise Activities and Dietary recommendations Current Exercise Habits: The patient does not participate in regular exercise at present, Exercise limited by: None identified  Goals    . Be happy.      Fall Risk Fall Risk  03/10/2016 09/30/2015 06/03/2014  Falls in the past year? No No No   Depression Screen PHQ 2/9 Scores 03/10/2016 09/30/2015 06/03/2014  PHQ - 2 Score 0 0 0  PHQ- 9 Score - 0 -    Cognitive Function MMSE - Mini Mental State Exam 03/10/2016  Orientation to time 5    Orientation to Place 5  Registration 3  Attention/ Calculation 5  Recall 3  Language- name 2 objects 2  Language- repeat 1  Language- follow 3 step command 3  Language- read & follow direction 1  Write a sentence 1  Copy design 1  Total score 30        Immunization History  Administered Date(s) Administered  . Tdap 02/08/2011   Screening Tests Health Maintenance  Topic Date Due  . OPHTHALMOLOGY EXAM  04/16/1960  . COLONOSCOPY  11/08/2015  . INFLUENZA VACCINE  05/07/2016 (Originally 09/08/2015)  . PNA vac Low Risk Adult (1 of 2 - PCV13) 12/03/2016 (Originally 04/17/2015)  . Hepatitis C Screening  06/03/2019 (Originally 11-21-1950)  . HIV Screening  06/03/2019 (Originally 04/16/1965)  . HEMOGLOBIN A1C  04/01/2016  . FOOT EXAM  09/29/2016  . DTaP/Tdap/Td (2 - Td) 02/07/2021  . TETANUS/TDAP  02/07/2021  . ZOSTAVAX  Addressed      Plan:     Complete Fecal immunochemical testing.   Continue to eat heart healthy diet (full of fruits, vegetables, whole grains, lean protein, water--limit salt, fat, and sugar intake) and increase physical activity as tolerated.  During the course of the visit the patient was educated and counseled about the following appropriate screening and preventive services:   Vaccines to include Pneumoccal, Influenza, Hepatitis B, Td, Zostavax, HCV   Cardiovascular Disease  Colorectal cancer screening  Diabetes screening  Prostate Cancer Screening  Glaucoma screening  Nutrition counseling   Patient Instructions (the written plan) was given to the patient.    Shela Nevin, [place]  03/10/2016

## 2016-03-08 NOTE — Progress Notes (Signed)
Pre visit review using our clinic review tool, if applicable. No additional management support is needed unless otherwise documented below in the visit note. 

## 2016-03-10 ENCOUNTER — Encounter: Payer: Commercial Managed Care - HMO | Admitting: Family Medicine

## 2016-03-10 ENCOUNTER — Encounter: Payer: Self-pay | Admitting: Family Medicine

## 2016-03-10 ENCOUNTER — Ambulatory Visit (INDEPENDENT_AMBULATORY_CARE_PROVIDER_SITE_OTHER): Payer: Medicare HMO | Admitting: Family Medicine

## 2016-03-10 VITALS — BP 126/75 | HR 54 | Temp 98.2°F | Ht 70.0 in | Wt 217.8 lb

## 2016-03-10 DIAGNOSIS — Z0000 Encounter for general adult medical examination without abnormal findings: Secondary | ICD-10-CM | POA: Diagnosis not present

## 2016-03-10 DIAGNOSIS — Z2821 Immunization not carried out because of patient refusal: Secondary | ICD-10-CM

## 2016-03-10 DIAGNOSIS — Z1211 Encounter for screening for malignant neoplasm of colon: Secondary | ICD-10-CM

## 2016-03-10 DIAGNOSIS — Z136 Encounter for screening for cardiovascular disorders: Secondary | ICD-10-CM

## 2016-03-10 DIAGNOSIS — E1169 Type 2 diabetes mellitus with other specified complication: Secondary | ICD-10-CM | POA: Diagnosis not present

## 2016-03-10 DIAGNOSIS — E669 Obesity, unspecified: Secondary | ICD-10-CM

## 2016-03-10 LAB — LIPID PANEL
CHOLESTEROL: 243 mg/dL — AB (ref 0–200)
HDL: 55.8 mg/dL (ref >39.00)
LDL Cholesterol: 166 mg/dL — ABNORMAL HIGH (ref 0–99)
NONHDL: 186.85
TRIGLYCERIDES: 104 mg/dL (ref 0.0–149.0)
Total CHOL/HDL Ratio: 4
VLDL: 20.8 mg/dL (ref 0.0–40.0)

## 2016-03-10 LAB — HEMOGLOBIN A1C: Hgb A1c MFr Bld: 7 % — ABNORMAL HIGH (ref 4.6–6.5)

## 2016-03-10 LAB — COMPREHENSIVE METABOLIC PANEL
ALT: 53 U/L (ref 0–53)
AST: 50 U/L — ABNORMAL HIGH (ref 0–37)
Albumin: 4.1 g/dL (ref 3.5–5.2)
Alkaline Phosphatase: 42 U/L (ref 39–117)
BUN: 10 mg/dL (ref 6–23)
CO2: 32 meq/L (ref 19–32)
Calcium: 9.9 mg/dL (ref 8.4–10.5)
Chloride: 100 mEq/L (ref 96–112)
Creatinine, Ser: 1.01 mg/dL (ref 0.40–1.50)
GFR: 95.08 mL/min (ref >60.00)
GLUCOSE: 141 mg/dL — AB (ref 70–99)
POTASSIUM: 4.3 meq/L (ref 3.5–5.1)
Sodium: 137 mEq/L (ref 135–145)
Total Bilirubin: 1.6 mg/dL — ABNORMAL HIGH (ref 0.2–1.2)
Total Protein: 7.9 g/dL (ref 6.0–8.3)

## 2016-03-10 LAB — MICROALBUMIN / CREATININE URINE RATIO
Creatinine,U: 200.8 mg/dL
Microalb Creat Ratio: 0.3 mg/g (ref 0.0–30.0)
Microalb, Ur: 0.7 mg/dL (ref 0.0–1.9)

## 2016-03-10 NOTE — Progress Notes (Signed)
Chief Complaint  Patient presents with  . Annual Exam    fasting    Well Male Per Beagley is here for a complete physical.   His last physical was >1 year ago.  Current diet: in general, a "healthy" diet   Current exercise: None right now Weight trend: lost a little Does pt snore? No. Daytime fatigue? No. Seat belt? Yes.    Health maintenance Colonoscopy- had a normal one, did FIT around 2-3 years ago Tdap- 2011 Flu- refuses PNA- refuses AAA- has never had (he has smoked >100 cigarettes in lifetime)  Past Medical History:  Diagnosis Date  . DDD (degenerative disc disease), lumbar   . Diabetes (HCC)   . Hyperlipidemia   . Hypertension     Past Surgical History:  Procedure Laterality Date  . HYDROCELE EXCISION / REPAIR    . KIDNEY STONE SURGERY  2013  . TOOTH EXTRACTION     Full Dentures   Medications  Current Outpatient Prescriptions on File Prior to Visit  Medication Sig Dispense Refill  . aspirin 81 MG tablet Take 81 mg by mouth daily.    Marland Kitchen atorvastatin (LIPITOR) 20 MG tablet Take 1 tablet (20 mg total) by mouth daily. 30 tablet 0  . lisinopril-hydrochlorothiazide (PRINZIDE,ZESTORETIC) 20-25 MG tablet Take 1 tablet by mouth daily. 30 tablet 0   Allergies Allergies  Allergen Reactions  . Metformin And Related Rash    Caused Exacerbation in Psoriasis [skin dry & peeled]   Family History Family History  Problem Relation Age of Onset  . Hypertension Mother     Living  . Arthritis Mother   . Stroke Father 62    Deceased  . Alcohol abuse Father   . Heart disease Sister     #1  . Hypertension Sister     #5  . Diabetes Brother     Deceased  . Stroke Brother     #2  . Alcohol abuse Brother     #2  . Throat cancer Brother     #3  . Arthritis/Rheumatoid Daughter   . Healthy Daughter     #2  . Alcohol abuse Son   . Heart attack Neg Hx     Review of Systems: Constitutional:  no unexpected change in weight, no fevers or chills Eye:  no recent  significant change in vision Ear/Nose/Mouth/Throat:  Ears:  no tinnitus or hearing loss Nose/Mouth/Throat:  no complaints of nasal congestion or bleeding, no sore throat and oral sores Cardiovascular:  no chest pain, no palpitations Respiratory:  no cough and no shortness of breath Gastrointestinal:  no abdominal pain, no change in bowel habits, no nausea, vomiting, diarrhea, or constipation and no black or bloody stool GU:  Male: negative for dysuria, frequency, and incontinence and negative for prostate symptoms Musculoskeletal/Extremities:  no pain, redness, or swelling of the joints Integumentary (Skin/Breast):  no abnormal skin lesions reported Neurologic:  no headaches, no numbness, tingling Endocrine:  weight changes, masses in the neck, heat/cold intolerance, bowel or skin changes, or cardiovascular system symptoms Hematologic/Lymphatic:  no abnormal bleeding, no HIV risk factors, no night sweats, no swollen nodes, no weight loss  Exam BP 126/75 (BP Location: Left Arm, Patient Position: Sitting, Cuff Size: Large)   Pulse (!) 54   Temp 98.2 F (36.8 C) (Oral)   Ht 5\' 10"  (1.778 m)   Wt 217 lb 12.8 oz (98.8 kg)   SpO2 99%   BMI 31.25 kg/m  General:  well developed, well nourished, in  no apparent distress Skin:  no significant moles, warts, or growths Head:  no masses, lesions, or tenderness Eyes:  pupils equal and round, sclera anicteric without injection Ears:  canals without lesions, TMs shiny without retraction, no obvious effusion, no erythema Nose:  nares patent, septum midline, mucosa normal Throat/Pharynx:  lips and gingiva without lesion; tongue and uvula midline; non-inflamed pharynx; no exudates or postnasal drainage Neck: neck supple without adenopathy, thyromegaly, or masses Lungs:  clear to auscultation, breath sounds equal bilaterally, no respiratory distress Cardio:  regular rate and rhythm without murmurs, heart sounds without clicks or rubs Abdomen:  abdomen  soft, nontender; bowel sounds normal; no masses or organomegaly Genital (male): Uncircumcised penis, no lesions or discharge; testes present bilaterally without masses or tenderness Rectal: Deferred Musculoskeletal:  symmetrical muscle groups noted without atrophy or deformity Extremities:  no clubbing, cyanosis, or edema, no deformities, no skin discoloration Neuro:  gait normal; deep tendon reflexes normal and symmetric Psych: well oriented with normal range of affect and appropriate judgment/insight  Assessment and Plan  Well adult exam  Diabetes mellitus 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 male. Counseled on diet and exercise. Immunizations, labs, and further orders as above. He will think about the PNA vaccine. Follow up in 6 mo pending the above workup. The patient voiced understanding and agreement to the plan.  Jilda Rocheicholas Paul [place], DO 03/10/16 12:22 PM

## 2016-03-10 NOTE — Patient Instructions (Signed)

## 2016-03-14 ENCOUNTER — Ambulatory Visit (HOSPITAL_BASED_OUTPATIENT_CLINIC_OR_DEPARTMENT_OTHER)
Admission: RE | Admit: 2016-03-14 | Discharge: 2016-03-14 | Disposition: A | Discharge status: Home / Self Care | Payer: Medicare HMO | Source: Ambulatory Visit | Attending: Family Medicine | Admitting: Family Medicine

## 2016-03-14 DIAGNOSIS — Z136 Encounter for screening for cardiovascular disorders: Secondary | ICD-10-CM | POA: Diagnosis not present

## 2016-03-14 DIAGNOSIS — I77811 Abdominal aortic ectasia: Secondary | ICD-10-CM | POA: Diagnosis not present

## 2016-03-14 DIAGNOSIS — Z720 Tobacco use: Secondary | ICD-10-CM | POA: Diagnosis not present

## 2016-03-17 ENCOUNTER — Telehealth: Payer: Self-pay | Admitting: Family Medicine

## 2016-03-17 DIAGNOSIS — H43813 Vitreous degeneration, bilateral: Secondary | ICD-10-CM | POA: Diagnosis not present

## 2016-03-17 DIAGNOSIS — H25813 Combined forms of age-related cataract, bilateral: Secondary | ICD-10-CM | POA: Diagnosis not present

## 2016-03-17 DIAGNOSIS — R740 Nonspecific elevation of levels of transaminase and lactic acid dehydrogenase [LDH]: Principal | ICD-10-CM

## 2016-03-17 DIAGNOSIS — R7401 Elevation of levels of liver transaminase levels: Secondary | ICD-10-CM

## 2016-03-17 DIAGNOSIS — E119 Type 2 diabetes mellitus without complications: Secondary | ICD-10-CM | POA: Diagnosis not present

## 2016-03-17 DIAGNOSIS — H35033 Hypertensive retinopathy, bilateral: Secondary | ICD-10-CM | POA: Diagnosis not present

## 2016-03-17 DIAGNOSIS — H524 Presbyopia: Secondary | ICD-10-CM | POA: Diagnosis not present

## 2016-03-17 DIAGNOSIS — E785 Hyperlipidemia, unspecified: Secondary | ICD-10-CM

## 2016-03-17 MED ORDER — ATORVASTATIN CALCIUM 40 MG PO TABS: 40.0000 mg | ORAL_TABLET | Freq: Every day | ORAL | 1 refills | Status: DC

## 2016-03-17 NOTE — Telephone Encounter (Signed)
-----   Message from Sharlene DoryNicholas Paul Wendling, DO sent at 03/14/2016  1:18 PM EST ----- Let pt know his US showed some changes of his abd aorta, but nothing that requires management at this time. Will recheck in 5 years. Please place recall for this. TY.

## 2016-03-17 NOTE — Telephone Encounter (Signed)
Pt returning call for lab results   CB: [phone]

## 2016-03-17 NOTE — Telephone Encounter (Signed)
-----   Message from Sharlene DoryNicholas Paul Wendling, DO sent at 03/10/2016  4:51 PM EST ----- Dm close to goal, but not quite. Should increase dose of Lipitor as well, 10 yr CVD is 28.4%.  TL- Schedule him for DM visit in 3 mo with me. I want him to increase his dose of Lipitor to 40 mg daily (2 tabs per day). I also need him to return for a lab visit due to an elevated liver enzyme. Please order Hep C ab, hep B surface Ag, iron panel, hepatic panel and CBC, dx of elevated AST. TY.

## 2016-03-17 NOTE — Telephone Encounter (Addendum)
Informed the pt of his recent lab results and note.  Pt verbalized understanding and agreed.  Pt stated that he will look at his schedule and give us a call back to schedule his 3 mos DM visit.   New prescription for the Lipitor 40mg  was sent to the pharmacy by e-script.   Pt was scheduled a lab visit for (Thurs. 03/24/16 @ 8:15am).  Future labs ordered and sent.//AB/CMA    Called and spoke with the pt and informed him of recent Aorta US results.  Pt verbalized understanding and agreed.  Will give the pt a follow-up call in 5 years to recheck another Aorta US.//AB/CMA

## 2016-03-17 NOTE — Addendum Note (Signed)
Addended by: Verdie ShireBAYNES, Karolyn Messing M on: 03/17/2016 02:50 PM   Modules accepted: Orders

## 2016-03-24 ENCOUNTER — Other Ambulatory Visit: Payer: Medicare HMO

## 2016-03-24 ENCOUNTER — Telehealth: Payer: Self-pay | Admitting: Family Medicine

## 2016-03-24 DIAGNOSIS — I10 Essential (primary) hypertension: Secondary | ICD-10-CM

## 2016-03-24 LAB — HM DIABETES EYE EXAM

## 2016-03-24 NOTE — Telephone Encounter (Signed)
Patient is requesting a refill of lisinopril-hydrochlorothiazide (PRINZIDE,ZESTORETIC) 20-25 MG tablet Please advise  Pharmacy: [id number] - [place], [place] - [address]

## 2016-03-25 MED ORDER — LISINOPRIL-HYDROCHLOROTHIAZIDE 20-25 MG PO TABS: 1.0000 | ORAL_TABLET | Freq: Every day | ORAL | 0 refills | Status: DC

## 2016-03-25 NOTE — Telephone Encounter (Signed)
Pt to follow up in 6 months. Refilled Rx #90 tablets #0 refills. TL/CMA

## 2016-05-11 ENCOUNTER — Other Ambulatory Visit (INDEPENDENT_AMBULATORY_CARE_PROVIDER_SITE_OTHER): Payer: Medicare HMO

## 2016-05-11 DIAGNOSIS — Z1211 Encounter for screening for malignant neoplasm of colon: Secondary | ICD-10-CM

## 2016-05-11 LAB — FECAL OCCULT BLOOD, IMMUNOCHEMICAL: FECAL OCCULT BLD: NEGATIVE

## 2016-06-24 ENCOUNTER — Other Ambulatory Visit: Payer: Self-pay | Admitting: Family Medicine

## 2016-06-24 DIAGNOSIS — I10 Essential (primary) hypertension: Secondary | ICD-10-CM

## 2016-06-24 DIAGNOSIS — E785 Hyperlipidemia, unspecified: Secondary | ICD-10-CM

## 2016-06-24 MED ORDER — ATORVASTATIN CALCIUM 40 MG PO TABS: 40.0000 mg | ORAL_TABLET | Freq: Every day | ORAL | 0 refills | Status: DC

## 2016-06-24 MED ORDER — LISINOPRIL-HYDROCHLOROTHIAZIDE 20-25 MG PO TABS: 1.0000 | ORAL_TABLET | Freq: Every day | ORAL | 0 refills | Status: DC

## 2016-06-24 NOTE — Telephone Encounter (Signed)
Spoke with the pt and informed him that his refill request has been approved.  Informed the pt that he is past due for a follow-up with Dr. Carmelia RollerWendling and future lab work.  Pt verbalized understanding and agreed.  Pt was scheduled a 3 mos follow-up for DM and blood work for (Wed-07/13/16 @ 8:15am).//AB/CMA

## 2016-06-24 NOTE — Telephone Encounter (Signed)
Caller name: Jillyn HiddenGary  Relation to pt: self  Call back number: [phone] [id number] [place] - [address]  Reason for call: Pt is requesting refill on lisinopril-hydrochlorothiazide (PRINZIDE,ZESTORETIC) 20-25 MG tablet and atorvastatin (LIPITOR) 40 MG tablet. Please advise.

## 2016-07-13 ENCOUNTER — Encounter: Payer: Self-pay | Admitting: Family Medicine

## 2016-07-13 ENCOUNTER — Ambulatory Visit (INDEPENDENT_AMBULATORY_CARE_PROVIDER_SITE_OTHER): Payer: Medicare HMO | Admitting: Family Medicine

## 2016-07-13 VITALS — BP 118/84 | HR 61 | Temp 98.2°F | Ht 70.0 in | Wt 216.6 lb

## 2016-07-13 DIAGNOSIS — R740 Nonspecific elevation of levels of transaminase and lactic acid dehydrogenase [LDH]: Secondary | ICD-10-CM

## 2016-07-13 DIAGNOSIS — I10 Essential (primary) hypertension: Secondary | ICD-10-CM | POA: Diagnosis not present

## 2016-07-13 DIAGNOSIS — Z23 Encounter for immunization: Secondary | ICD-10-CM

## 2016-07-13 DIAGNOSIS — E785 Hyperlipidemia, unspecified: Secondary | ICD-10-CM

## 2016-07-13 DIAGNOSIS — E119 Type 2 diabetes mellitus without complications: Secondary | ICD-10-CM | POA: Diagnosis not present

## 2016-07-13 DIAGNOSIS — R7401 Elevation of levels of liver transaminase levels: Secondary | ICD-10-CM

## 2016-07-13 LAB — CBC
HCT: 43.3 % (ref 39.0–52.0)
Hemoglobin: 14.8 g/dL (ref 13.0–17.0)
MCHC: 34.2 g/dL (ref 30.0–36.0)
MCV: 88 fl (ref 78.0–100.0)
Platelets: 178 10*3/uL (ref 150.0–400.0)
RBC: 4.92 Mil/uL (ref 4.22–5.81)
RDW: 14.1 % (ref 11.5–15.5)
WBC: 5.1 10*3/uL (ref 4.0–10.5)

## 2016-07-13 LAB — HEPATIC FUNCTION PANEL
ALT: 53 U/L (ref 0–53)
AST: 53 U/L — ABNORMAL HIGH (ref 0–37)
Albumin: 3.9 g/dL (ref 3.5–5.2)
Alkaline Phosphatase: 40 U/L (ref 39–117)
BILIRUBIN TOTAL: 1.3 mg/dL — AB (ref 0.2–1.2)
Bilirubin, Direct: 0.3 mg/dL (ref 0.0–0.3)
Total Protein: 7.6 g/dL (ref 6.0–8.3)

## 2016-07-13 LAB — HEMOGLOBIN A1C: Hgb A1c MFr Bld: 7.5 % — ABNORMAL HIGH (ref 4.6–6.5)

## 2016-07-13 LAB — FERRITIN: FERRITIN: 167.5 ng/mL (ref 22.0–322.0)

## 2016-07-13 LAB — IBC PANEL
Iron: 98 ug/dL (ref 42–165)
SATURATION RATIOS: 24.1 % (ref 20.0–50.0)
TRANSFERRIN: 290 mg/dL (ref 212.0–360.0)

## 2016-07-13 LAB — HEPATITIS C ANTIBODY: HCV Ab: REACTIVE — AB

## 2016-07-13 LAB — HEPATITIS B SURFACE ANTIGEN: Hepatitis B Surface Ag: NEGATIVE

## 2016-07-13 NOTE — Patient Instructions (Signed)
Let us know if you need anything.  Give us 2-3 business days to get the results of your labs back.    

## 2016-07-13 NOTE — Progress Notes (Signed)
Subjective:   Chief Complaint  Patient presents with  . Follow-up    3 mos on DM and HTN    Maxwell Hanson is a 66 y.o. male here for follow-up of diabetes.   He checks his glucose levels 0 times per day. Patient does not require insulin.   Medications include: diet controlled Patient exercises 3-4 days per week on average.   He does take an aspirin daily. Statin? Yes ACEi/ARB? Yes  Hypertension Patient presents for hypertension follow up. He does not monitor home blood pressures. He is compliant with medications Prinzide 20-25 mg. Patient has these side effects of medication: none He is adhering to a healthy diet overall. Exercise: walking 3-4x/week  Hyperlipidemia Patient presents for dyslipidemia follow up. Compliance with treatment thus far has been good. He denies myalgias. He is adhering to a healthy diet. The patient exercises three times a week.  The patient is not known to have coexisting coronary artery disease.   Past Medical History:  Diagnosis Date  . DDD (degenerative disc disease), lumbar   . Diabetes (HCC)   . Hyperlipidemia   . Hypertension     Past Surgical History:  Procedure Laterality Date  . HYDROCELE EXCISION / REPAIR    . KIDNEY STONE SURGERY  2013  . TOOTH EXTRACTION     Full Dentures    Social History   Social History  . Marital status: Married   Social History Main Topics  . Smoking status: Former Games developermoker  . Smokeless tobacco: Never Used     Comment: quit when he was 20s, only smoked for 5 yrs  . Alcohol use No  . Drug use: No  . Sexual activity: Yes   Current Outpatient Prescriptions on File Prior to Visit  Medication Sig Dispense Refill  . aspirin 81 MG tablet Take 81 mg by mouth daily.    Marland Kitchen. atorvastatin (LIPITOR) 40 MG tablet Take 1 tablet (40 mg total) by mouth daily. 90 tablet 0  . lisinopril-hydrochlorothiazide (PRINZIDE,ZESTORETIC) 20-25 MG tablet Take 1 tablet by mouth daily. 90 tablet 0    Related testing: Foot  exam(monofilament and inspection):done Date of retinal exam: 03/2016  Done by:  Dr. Hazle Quantigby Pneumovax: not done- getting today  Flu Shot: refuses  Review of Systems: Eye:  No recent significant change in vision Pulmonary:  No SOB Cardiovascular:  No chest pain, no palpitations Skin/Integumentary ROS:  No abnormal skin lesions reported Neurologic:  No numbness, tingling  Objective:  BP 118/84 (BP Location: Left Arm, Patient Position: Sitting, Cuff Size: Large) Comment: Patient has not taking BP medication this am.  Pulse 61   Temp 98.2 F (36.8 C) (Oral)   Ht 5\' 10"  (1.778 m)   Wt 216 lb 9.6 oz (98.2 kg)   SpO2 98%   BMI 31.08 kg/m  General:  Well developed, well nourished, in no apparent distress Skin:  Warm, no pallor or diaphoresis Head:  Normocephalic, atraumatic Eyes:  Pupils equal and round, sclera anicteric without injection  Nose:  External nares without trauma, no discharge Throat/Pharynx:  Lips and gingiva without lesion Neck: Neck supple.  No obvious thyromegaly or masses.  No bruits Lungs:  clear to auscultation, breath sounds equal bilaterally, no wheezes, rales, or stridor Cardio:  regular rate and rhythm without murmurs, no bruits, no LE edema Abdomen:  Abdomen soft, non-tender, BS normal Musculoskeletal:  Symmetrical muscle groups noted without atrophy or deformity Neuro:  Sensation intact to pinprick on feet Psych: Age appropriate judgment and insight  Assessment:   Essential hypertension, benign  Type 2 diabetes mellitus without complication, without long-term current use of insulin (HCC) - Plan: HgB A1c, HM Diabetes Foot Exam  Hyperlipidemia, unspecified hyperlipidemia type  Elevated AST (SGOT) - Plan: Hepatitis C antibody, Hepatitis B surface antigen, IBC panel, Hepatic function panel, CBC, Ferritin  Need for pneumococcal vaccination - Plan: Pneumococcal conjugate vaccine 13-valent   Plan:   Orders as above. Counseled on diet and exercise. A1c  between 7-8 OK for this patient. I do not believe he would be willing to take any pharmacotherapy anyway.  F/u on elevated AST. Likely related to NAFLD, will order RUQ [place] and consider future labs if still present on today's labs. PCV23 in 1 year. F/u in 6 mo. The patient voiced understanding and agreement to the plan.  Jilda Roche [place], DO 07/13/16 9:33 AM

## 2016-07-15 LAB — HEPATITIS C RNA QUANTITATIVE
HCV QUANT: 2600000 [IU]/mL — AB
HCV Quantitative Log: 6.41 Log IU/mL — ABNORMAL HIGH

## 2016-07-20 ENCOUNTER — Other Ambulatory Visit: Payer: Self-pay | Admitting: Family Medicine

## 2016-07-20 ENCOUNTER — Telehealth: Payer: Self-pay | Admitting: Family Medicine

## 2016-07-20 DIAGNOSIS — R768 Other specified abnormal immunological findings in serum: Secondary | ICD-10-CM

## 2016-07-20 NOTE — Telephone Encounter (Signed)
Tried to call pt again regarding lab results. LVM.

## 2016-07-20 NOTE — Progress Notes (Signed)
Tried to call pt, no answer, LVM. Pt's Hep C antibody came back positive. We need him to return for a follow up lab, non-fasting, order has been placed. We are also referring him to a liver specialist. Let's stop the Lipitor (atorvastatin) for now also. Let me know if there are any questions.

## 2016-07-22 NOTE — Telephone Encounter (Signed)
Patient returning call, please call back @ [phone]

## 2016-07-26 ENCOUNTER — Other Ambulatory Visit (INDEPENDENT_AMBULATORY_CARE_PROVIDER_SITE_OTHER): Payer: Medicare HMO

## 2016-07-26 DIAGNOSIS — R768 Other specified abnormal immunological findings in serum: Secondary | ICD-10-CM

## 2016-07-29 LAB — HEPATITIS C GENOTYPE

## 2016-09-23 ENCOUNTER — Telehealth: Payer: Self-pay | Admitting: Family Medicine

## 2016-09-23 DIAGNOSIS — I10 Essential (primary) hypertension: Secondary | ICD-10-CM

## 2016-09-23 MED ORDER — LISINOPRIL-HYDROCHLOROTHIAZIDE 20-25 MG PO TABS: 1.0000 | ORAL_TABLET | Freq: Every day | ORAL | 1 refills | Status: DC

## 2016-09-23 NOTE — Telephone Encounter (Signed)
Called and [facility] @ 3:35pm @ [phone]) informing the pt that his refill request for the Lisinopril-HCTZ had been approved and sent to the pharmacy, but we had questions regarding the Oxycodone.  We don't have the Oxycodone on his medication list and we did not prescribe it.  He can give [place] a call back regarding this.//AB/CMA

## 2016-09-23 NOTE — Telephone Encounter (Signed)
Self.  Refill for oxyCODONE and lisinopril-hydrochlorothiazide   CB: [phone]  Pharmacy: [id number] - [place], [place] - [address]

## 2016-09-26 ENCOUNTER — Other Ambulatory Visit: Payer: Self-pay | Admitting: Family Medicine

## 2016-09-26 DIAGNOSIS — E785 Hyperlipidemia, unspecified: Secondary | ICD-10-CM

## 2016-11-30 DIAGNOSIS — I10 Essential (primary) hypertension: Secondary | ICD-10-CM | POA: Diagnosis not present

## 2016-11-30 DIAGNOSIS — R079 Chest pain, unspecified: Secondary | ICD-10-CM | POA: Diagnosis not present

## 2016-11-30 DIAGNOSIS — Z79899 Other long term (current) drug therapy: Secondary | ICD-10-CM | POA: Diagnosis not present

## 2016-11-30 DIAGNOSIS — R9431 Abnormal electrocardiogram [ECG] [EKG]: Secondary | ICD-10-CM | POA: Diagnosis not present

## 2016-11-30 DIAGNOSIS — Z882 Allergy status to sulfonamides status: Secondary | ICD-10-CM | POA: Diagnosis not present

## 2016-11-30 DIAGNOSIS — R252 Cramp and spasm: Secondary | ICD-10-CM | POA: Diagnosis not present

## 2016-11-30 DIAGNOSIS — R7989 Other specified abnormal findings of blood chemistry: Secondary | ICD-10-CM | POA: Diagnosis not present

## 2017-02-15 DIAGNOSIS — H52209 Unspecified astigmatism, unspecified eye: Secondary | ICD-10-CM | POA: Diagnosis not present

## 2017-02-15 DIAGNOSIS — H524 Presbyopia: Secondary | ICD-10-CM | POA: Diagnosis not present

## 2017-02-15 DIAGNOSIS — H5213 Myopia, bilateral: Secondary | ICD-10-CM | POA: Diagnosis not present

## 2017-04-04 ENCOUNTER — Telehealth: Payer: Self-pay | Admitting: Family Medicine

## 2017-04-04 NOTE — Telephone Encounter (Signed)
Attempted to call patient to schedule Annual Wellness Visit, but patient did not answer. Will try to call patient again at a later time. SF °

## 2017-04-12 ENCOUNTER — Other Ambulatory Visit: Payer: Self-pay | Admitting: Family Medicine

## 2017-04-12 DIAGNOSIS — I10 Essential (primary) hypertension: Secondary | ICD-10-CM

## 2017-04-14 ENCOUNTER — Encounter: Payer: Self-pay | Admitting: Family Medicine

## 2017-04-14 ENCOUNTER — Ambulatory Visit (INDEPENDENT_AMBULATORY_CARE_PROVIDER_SITE_OTHER): Payer: Medicare Other | Admitting: Family Medicine

## 2017-04-14 VITALS — BP 110/72 | HR 77 | Temp 99.6°F | Ht 70.0 in | Wt 211.4 lb

## 2017-04-14 DIAGNOSIS — J208 Acute bronchitis due to other specified organisms: Secondary | ICD-10-CM

## 2017-04-14 DIAGNOSIS — B9689 Other specified bacterial agents as the cause of diseases classified elsewhere: Secondary | ICD-10-CM

## 2017-04-14 MED ORDER — BENZONATATE 100 MG PO CAPS: 100.0000 mg | ORAL_CAPSULE | Freq: Three times a day (TID) | ORAL | 0 refills | Status: DC | PRN

## 2017-04-14 MED ORDER — METHYLPREDNISOLONE ACETATE 80 MG/ML IJ SUSP
80.0000 mg | Freq: Once | INTRAMUSCULAR | Status: AC
Start: 2017-04-14 — End: 2017-04-14
  Administered 2017-04-14: 80 mg via INTRAMUSCULAR

## 2017-04-14 MED ORDER — AZITHROMYCIN 250 MG PO TABS: ORAL_TABLET | ORAL | 0 refills | Status: DC

## 2017-04-14 NOTE — Progress Notes (Signed)
Chief Complaint  Patient presents with  . Sinusitis    Maxwell CrapeGary Hanson here for URI complaints.  Duration: 3 days  Associated symptoms: fevers, sinus congestion, rhinorrhea, sore throat, myalgia and dry cough Denies: sinus pain, ear pain, ear drainage, shortness of breath Treatment to date: none Sick contacts: No  ROS:  Const: +fevers HEENT: As noted in HPI Lungs: No SOB  Past Medical History:  Diagnosis Date  . DDD (degenerative disc disease), lumbar   . Diabetes (HCC)   . Hyperlipidemia   . Hypertension    BP 110/72 (BP Location: Left Arm, Patient Position: Sitting, Cuff Size: Large)   Pulse 77   Temp 99.6 F (37.6 C) (Oral)   Ht 5\' 10"  (1.778 m)   Wt 211 lb 6 oz (95.9 kg)   SpO2 95%   BMI 30.33 kg/m  General: Awake, alert, appears stated age HEENT: AT, [place], ears patent b/l and TM's neg, no ttp over sinuses, nares patent w/o discharge, pharynx pink and without exudates, MMM Neck: No masses or asymmetry Heart: RRR Lungs: CTAB, no accessory muscle use Psych: Age appropriate judgment and insight, normal mood and affect  Acute bacterial bronchitis - Plan: benzonatate (TESSALON) 100 MG capsule, azithromycin (ZITHROMAX) 250 MG tablet, methylPREDNISolone acetate (DEPO-MEDROL) injection 80 mg  Orders as above. Continue to push fluids, practice good hand hygiene, cover mouth when coughing. F/u for CPE at earliest convenience. If starting to experience fevers, shaking, or shortness of breath, seek immediate care. Pt voiced understanding and agreement to the plan.  Jilda Rocheicholas Paul [place], DO 04/14/17 8:40 AM

## 2017-04-14 NOTE — Progress Notes (Signed)
Pre visit review using our clinic review tool, if applicable. No additional management support is needed unless otherwise documented below in the visit note. 

## 2017-04-14 NOTE — Patient Instructions (Addendum)
Continue to push fluids, practice good hand hygiene, and cover your mouth if you cough.  If you start having increasing fevers, shaking or shortness of breath, seek immediate care.  For the muscle cramping, drink lots of fluids. Also take a spoonful of pickle juice nightly. An alternative would be a teaspoon of mustard, but most people prefer pickle juice.   Let Maxwell Hanson know if you need anything.

## 2017-04-27 ENCOUNTER — Other Ambulatory Visit: Payer: Self-pay | Admitting: Family Medicine

## 2017-04-27 ENCOUNTER — Telehealth: Payer: Self-pay | Admitting: Family Medicine

## 2017-04-27 DIAGNOSIS — Z1211 Encounter for screening for malignant neoplasm of colon: Secondary | ICD-10-CM

## 2017-04-27 NOTE — Telephone Encounter (Signed)
Refer to GI for ccs. TY.

## 2017-04-27 NOTE — Telephone Encounter (Signed)
Referral entered  

## 2017-04-27 NOTE — Telephone Encounter (Signed)
Copied from CRM [id number]. Topic: Quick Communication - See Telephone Encounter >> Apr 27, 2017  3:12 PM Rudi CocoLathan, Skylen Spiering M, [place] wrote: CRM for notification. See Telephone encounter for: 04/27/17.  Pt. Calling to see when he can schedule to have his colostomy done. [phone]

## 2017-04-27 NOTE — Telephone Encounter (Signed)
Last done 2007

## 2017-04-28 ENCOUNTER — Encounter: Payer: Self-pay | Admitting: Gastroenterology

## 2017-06-13 ENCOUNTER — Ambulatory Visit: Payer: Medicare Other | Admitting: Gastroenterology

## 2017-07-11 ENCOUNTER — Encounter: Payer: Self-pay | Admitting: Family Medicine

## 2017-07-26 DIAGNOSIS — G544 Lumbosacral root disorders, not elsewhere classified: Secondary | ICD-10-CM | POA: Diagnosis not present

## 2017-07-26 DIAGNOSIS — M9904 Segmental and somatic dysfunction of sacral region: Secondary | ICD-10-CM | POA: Diagnosis not present

## 2017-07-26 DIAGNOSIS — M9905 Segmental and somatic dysfunction of pelvic region: Secondary | ICD-10-CM | POA: Diagnosis not present

## 2017-07-26 DIAGNOSIS — M9903 Segmental and somatic dysfunction of lumbar region: Secondary | ICD-10-CM | POA: Diagnosis not present

## 2017-07-27 DIAGNOSIS — M9905 Segmental and somatic dysfunction of pelvic region: Secondary | ICD-10-CM | POA: Diagnosis not present

## 2017-07-27 DIAGNOSIS — G544 Lumbosacral root disorders, not elsewhere classified: Secondary | ICD-10-CM | POA: Diagnosis not present

## 2017-07-27 DIAGNOSIS — M9903 Segmental and somatic dysfunction of lumbar region: Secondary | ICD-10-CM | POA: Diagnosis not present

## 2017-07-27 DIAGNOSIS — M9904 Segmental and somatic dysfunction of sacral region: Secondary | ICD-10-CM | POA: Diagnosis not present

## 2017-07-28 DIAGNOSIS — M9902 Segmental and somatic dysfunction of thoracic region: Secondary | ICD-10-CM | POA: Diagnosis not present

## 2017-07-28 DIAGNOSIS — M9903 Segmental and somatic dysfunction of lumbar region: Secondary | ICD-10-CM | POA: Diagnosis not present

## 2017-07-28 DIAGNOSIS — M9901 Segmental and somatic dysfunction of cervical region: Secondary | ICD-10-CM | POA: Diagnosis not present

## 2017-07-28 DIAGNOSIS — G542 Cervical root disorders, not elsewhere classified: Secondary | ICD-10-CM | POA: Diagnosis not present

## 2017-07-31 DIAGNOSIS — M9904 Segmental and somatic dysfunction of sacral region: Secondary | ICD-10-CM | POA: Diagnosis not present

## 2017-07-31 DIAGNOSIS — M9901 Segmental and somatic dysfunction of cervical region: Secondary | ICD-10-CM | POA: Diagnosis not present

## 2017-07-31 DIAGNOSIS — G542 Cervical root disorders, not elsewhere classified: Secondary | ICD-10-CM | POA: Diagnosis not present

## 2017-07-31 DIAGNOSIS — M9903 Segmental and somatic dysfunction of lumbar region: Secondary | ICD-10-CM | POA: Diagnosis not present

## 2017-08-01 DIAGNOSIS — M9902 Segmental and somatic dysfunction of thoracic region: Secondary | ICD-10-CM | POA: Diagnosis not present

## 2017-08-01 DIAGNOSIS — M9901 Segmental and somatic dysfunction of cervical region: Secondary | ICD-10-CM | POA: Diagnosis not present

## 2017-08-01 DIAGNOSIS — M9903 Segmental and somatic dysfunction of lumbar region: Secondary | ICD-10-CM | POA: Diagnosis not present

## 2017-08-01 DIAGNOSIS — G542 Cervical root disorders, not elsewhere classified: Secondary | ICD-10-CM | POA: Diagnosis not present

## 2017-08-02 DIAGNOSIS — M9904 Segmental and somatic dysfunction of sacral region: Secondary | ICD-10-CM | POA: Diagnosis not present

## 2017-08-02 DIAGNOSIS — M9905 Segmental and somatic dysfunction of pelvic region: Secondary | ICD-10-CM | POA: Diagnosis not present

## 2017-08-02 DIAGNOSIS — M9903 Segmental and somatic dysfunction of lumbar region: Secondary | ICD-10-CM | POA: Diagnosis not present

## 2017-08-02 DIAGNOSIS — G544 Lumbosacral root disorders, not elsewhere classified: Secondary | ICD-10-CM | POA: Diagnosis not present

## 2017-08-03 DIAGNOSIS — G544 Lumbosacral root disorders, not elsewhere classified: Secondary | ICD-10-CM | POA: Diagnosis not present

## 2017-08-03 DIAGNOSIS — M9903 Segmental and somatic dysfunction of lumbar region: Secondary | ICD-10-CM | POA: Diagnosis not present

## 2017-08-03 DIAGNOSIS — M9905 Segmental and somatic dysfunction of pelvic region: Secondary | ICD-10-CM | POA: Diagnosis not present

## 2017-08-03 DIAGNOSIS — M9904 Segmental and somatic dysfunction of sacral region: Secondary | ICD-10-CM | POA: Diagnosis not present

## 2017-08-04 DIAGNOSIS — G544 Lumbosacral root disorders, not elsewhere classified: Secondary | ICD-10-CM | POA: Diagnosis not present

## 2017-08-04 DIAGNOSIS — M9905 Segmental and somatic dysfunction of pelvic region: Secondary | ICD-10-CM | POA: Diagnosis not present

## 2017-08-04 DIAGNOSIS — M9903 Segmental and somatic dysfunction of lumbar region: Secondary | ICD-10-CM | POA: Diagnosis not present

## 2017-08-04 DIAGNOSIS — M9904 Segmental and somatic dysfunction of sacral region: Secondary | ICD-10-CM | POA: Diagnosis not present

## 2017-08-07 DIAGNOSIS — M9905 Segmental and somatic dysfunction of pelvic region: Secondary | ICD-10-CM | POA: Diagnosis not present

## 2017-08-07 DIAGNOSIS — M9903 Segmental and somatic dysfunction of lumbar region: Secondary | ICD-10-CM | POA: Diagnosis not present

## 2017-08-07 DIAGNOSIS — M9904 Segmental and somatic dysfunction of sacral region: Secondary | ICD-10-CM | POA: Diagnosis not present

## 2017-08-07 DIAGNOSIS — G544 Lumbosacral root disorders, not elsewhere classified: Secondary | ICD-10-CM | POA: Diagnosis not present

## 2017-08-08 ENCOUNTER — Ambulatory Visit: Payer: Medicare Other | Admitting: Gastroenterology

## 2017-08-08 DIAGNOSIS — M9905 Segmental and somatic dysfunction of pelvic region: Secondary | ICD-10-CM | POA: Diagnosis not present

## 2017-08-08 DIAGNOSIS — M9903 Segmental and somatic dysfunction of lumbar region: Secondary | ICD-10-CM | POA: Diagnosis not present

## 2017-08-08 DIAGNOSIS — M9904 Segmental and somatic dysfunction of sacral region: Secondary | ICD-10-CM | POA: Diagnosis not present

## 2017-08-08 DIAGNOSIS — G544 Lumbosacral root disorders, not elsewhere classified: Secondary | ICD-10-CM | POA: Diagnosis not present

## 2017-08-14 DIAGNOSIS — G542 Cervical root disorders, not elsewhere classified: Secondary | ICD-10-CM | POA: Diagnosis not present

## 2017-08-14 DIAGNOSIS — M9904 Segmental and somatic dysfunction of sacral region: Secondary | ICD-10-CM | POA: Diagnosis not present

## 2017-08-14 DIAGNOSIS — M9901 Segmental and somatic dysfunction of cervical region: Secondary | ICD-10-CM | POA: Diagnosis not present

## 2017-08-14 DIAGNOSIS — M9903 Segmental and somatic dysfunction of lumbar region: Secondary | ICD-10-CM | POA: Diagnosis not present

## 2017-08-15 DIAGNOSIS — M9903 Segmental and somatic dysfunction of lumbar region: Secondary | ICD-10-CM | POA: Diagnosis not present

## 2017-08-15 DIAGNOSIS — M9904 Segmental and somatic dysfunction of sacral region: Secondary | ICD-10-CM | POA: Diagnosis not present

## 2017-08-15 DIAGNOSIS — M9905 Segmental and somatic dysfunction of pelvic region: Secondary | ICD-10-CM | POA: Diagnosis not present

## 2017-08-15 DIAGNOSIS — G544 Lumbosacral root disorders, not elsewhere classified: Secondary | ICD-10-CM | POA: Diagnosis not present

## 2017-08-16 DIAGNOSIS — G544 Lumbosacral root disorders, not elsewhere classified: Secondary | ICD-10-CM | POA: Diagnosis not present

## 2017-08-16 DIAGNOSIS — M9903 Segmental and somatic dysfunction of lumbar region: Secondary | ICD-10-CM | POA: Diagnosis not present

## 2017-08-16 DIAGNOSIS — M9905 Segmental and somatic dysfunction of pelvic region: Secondary | ICD-10-CM | POA: Diagnosis not present

## 2017-08-16 DIAGNOSIS — M9904 Segmental and somatic dysfunction of sacral region: Secondary | ICD-10-CM | POA: Diagnosis not present

## 2017-08-17 DIAGNOSIS — M9905 Segmental and somatic dysfunction of pelvic region: Secondary | ICD-10-CM | POA: Diagnosis not present

## 2017-08-17 DIAGNOSIS — G544 Lumbosacral root disorders, not elsewhere classified: Secondary | ICD-10-CM | POA: Diagnosis not present

## 2017-08-17 DIAGNOSIS — M9904 Segmental and somatic dysfunction of sacral region: Secondary | ICD-10-CM | POA: Diagnosis not present

## 2017-08-17 DIAGNOSIS — M9903 Segmental and somatic dysfunction of lumbar region: Secondary | ICD-10-CM | POA: Diagnosis not present

## 2017-08-18 DIAGNOSIS — M9901 Segmental and somatic dysfunction of cervical region: Secondary | ICD-10-CM | POA: Diagnosis not present

## 2017-08-18 DIAGNOSIS — M9903 Segmental and somatic dysfunction of lumbar region: Secondary | ICD-10-CM | POA: Diagnosis not present

## 2017-08-18 DIAGNOSIS — M9902 Segmental and somatic dysfunction of thoracic region: Secondary | ICD-10-CM | POA: Diagnosis not present

## 2017-08-18 DIAGNOSIS — G542 Cervical root disorders, not elsewhere classified: Secondary | ICD-10-CM | POA: Diagnosis not present

## 2017-08-21 DIAGNOSIS — M9904 Segmental and somatic dysfunction of sacral region: Secondary | ICD-10-CM | POA: Diagnosis not present

## 2017-08-21 DIAGNOSIS — G544 Lumbosacral root disorders, not elsewhere classified: Secondary | ICD-10-CM | POA: Diagnosis not present

## 2017-08-21 DIAGNOSIS — M9903 Segmental and somatic dysfunction of lumbar region: Secondary | ICD-10-CM | POA: Diagnosis not present

## 2017-08-21 DIAGNOSIS — M9905 Segmental and somatic dysfunction of pelvic region: Secondary | ICD-10-CM | POA: Diagnosis not present

## 2017-08-22 DIAGNOSIS — M9904 Segmental and somatic dysfunction of sacral region: Secondary | ICD-10-CM | POA: Diagnosis not present

## 2017-08-22 DIAGNOSIS — M9905 Segmental and somatic dysfunction of pelvic region: Secondary | ICD-10-CM | POA: Diagnosis not present

## 2017-08-22 DIAGNOSIS — M9903 Segmental and somatic dysfunction of lumbar region: Secondary | ICD-10-CM | POA: Diagnosis not present

## 2017-08-22 DIAGNOSIS — G544 Lumbosacral root disorders, not elsewhere classified: Secondary | ICD-10-CM | POA: Diagnosis not present

## 2017-08-23 DIAGNOSIS — M9903 Segmental and somatic dysfunction of lumbar region: Secondary | ICD-10-CM | POA: Diagnosis not present

## 2017-08-23 DIAGNOSIS — M9902 Segmental and somatic dysfunction of thoracic region: Secondary | ICD-10-CM | POA: Diagnosis not present

## 2017-08-23 DIAGNOSIS — M9901 Segmental and somatic dysfunction of cervical region: Secondary | ICD-10-CM | POA: Diagnosis not present

## 2017-08-23 DIAGNOSIS — G542 Cervical root disorders, not elsewhere classified: Secondary | ICD-10-CM | POA: Diagnosis not present

## 2017-08-23 DIAGNOSIS — M9904 Segmental and somatic dysfunction of sacral region: Secondary | ICD-10-CM | POA: Diagnosis not present

## 2017-08-24 DIAGNOSIS — M9902 Segmental and somatic dysfunction of thoracic region: Secondary | ICD-10-CM | POA: Diagnosis not present

## 2017-08-24 DIAGNOSIS — M9901 Segmental and somatic dysfunction of cervical region: Secondary | ICD-10-CM | POA: Diagnosis not present

## 2017-08-24 DIAGNOSIS — G542 Cervical root disorders, not elsewhere classified: Secondary | ICD-10-CM | POA: Diagnosis not present

## 2017-08-24 DIAGNOSIS — M9903 Segmental and somatic dysfunction of lumbar region: Secondary | ICD-10-CM | POA: Diagnosis not present

## 2017-08-25 DIAGNOSIS — G544 Lumbosacral root disorders, not elsewhere classified: Secondary | ICD-10-CM | POA: Diagnosis not present

## 2017-08-25 DIAGNOSIS — M9903 Segmental and somatic dysfunction of lumbar region: Secondary | ICD-10-CM | POA: Diagnosis not present

## 2017-08-25 DIAGNOSIS — M9905 Segmental and somatic dysfunction of pelvic region: Secondary | ICD-10-CM | POA: Diagnosis not present

## 2017-08-25 DIAGNOSIS — M9904 Segmental and somatic dysfunction of sacral region: Secondary | ICD-10-CM | POA: Diagnosis not present

## 2017-08-28 DIAGNOSIS — M9901 Segmental and somatic dysfunction of cervical region: Secondary | ICD-10-CM | POA: Diagnosis not present

## 2017-08-28 DIAGNOSIS — G542 Cervical root disorders, not elsewhere classified: Secondary | ICD-10-CM | POA: Diagnosis not present

## 2017-08-28 DIAGNOSIS — M9903 Segmental and somatic dysfunction of lumbar region: Secondary | ICD-10-CM | POA: Diagnosis not present

## 2017-08-28 DIAGNOSIS — M9902 Segmental and somatic dysfunction of thoracic region: Secondary | ICD-10-CM | POA: Diagnosis not present

## 2017-08-29 DIAGNOSIS — M9901 Segmental and somatic dysfunction of cervical region: Secondary | ICD-10-CM | POA: Diagnosis not present

## 2017-08-29 DIAGNOSIS — M9902 Segmental and somatic dysfunction of thoracic region: Secondary | ICD-10-CM | POA: Diagnosis not present

## 2017-08-29 DIAGNOSIS — M9903 Segmental and somatic dysfunction of lumbar region: Secondary | ICD-10-CM | POA: Diagnosis not present

## 2017-08-29 DIAGNOSIS — G542 Cervical root disorders, not elsewhere classified: Secondary | ICD-10-CM | POA: Diagnosis not present

## 2017-08-31 DIAGNOSIS — M9904 Segmental and somatic dysfunction of sacral region: Secondary | ICD-10-CM | POA: Diagnosis not present

## 2017-08-31 DIAGNOSIS — M9903 Segmental and somatic dysfunction of lumbar region: Secondary | ICD-10-CM | POA: Diagnosis not present

## 2017-08-31 DIAGNOSIS — M9905 Segmental and somatic dysfunction of pelvic region: Secondary | ICD-10-CM | POA: Diagnosis not present

## 2017-08-31 DIAGNOSIS — G544 Lumbosacral root disorders, not elsewhere classified: Secondary | ICD-10-CM | POA: Diagnosis not present

## 2017-09-04 DIAGNOSIS — M9903 Segmental and somatic dysfunction of lumbar region: Secondary | ICD-10-CM | POA: Diagnosis not present

## 2017-09-04 DIAGNOSIS — M9904 Segmental and somatic dysfunction of sacral region: Secondary | ICD-10-CM | POA: Diagnosis not present

## 2017-09-04 DIAGNOSIS — G544 Lumbosacral root disorders, not elsewhere classified: Secondary | ICD-10-CM | POA: Diagnosis not present

## 2017-09-04 DIAGNOSIS — M9905 Segmental and somatic dysfunction of pelvic region: Secondary | ICD-10-CM | POA: Diagnosis not present

## 2017-09-05 DIAGNOSIS — G544 Lumbosacral root disorders, not elsewhere classified: Secondary | ICD-10-CM | POA: Diagnosis not present

## 2017-09-05 DIAGNOSIS — M9903 Segmental and somatic dysfunction of lumbar region: Secondary | ICD-10-CM | POA: Diagnosis not present

## 2017-09-05 DIAGNOSIS — M9905 Segmental and somatic dysfunction of pelvic region: Secondary | ICD-10-CM | POA: Diagnosis not present

## 2017-09-05 DIAGNOSIS — M9904 Segmental and somatic dysfunction of sacral region: Secondary | ICD-10-CM | POA: Diagnosis not present

## 2017-09-06 DIAGNOSIS — M9903 Segmental and somatic dysfunction of lumbar region: Secondary | ICD-10-CM | POA: Diagnosis not present

## 2017-09-06 DIAGNOSIS — M9904 Segmental and somatic dysfunction of sacral region: Secondary | ICD-10-CM | POA: Diagnosis not present

## 2017-09-06 DIAGNOSIS — M9905 Segmental and somatic dysfunction of pelvic region: Secondary | ICD-10-CM | POA: Diagnosis not present

## 2017-09-06 DIAGNOSIS — G544 Lumbosacral root disorders, not elsewhere classified: Secondary | ICD-10-CM | POA: Diagnosis not present

## 2017-09-07 DIAGNOSIS — M9901 Segmental and somatic dysfunction of cervical region: Secondary | ICD-10-CM | POA: Diagnosis not present

## 2017-09-07 DIAGNOSIS — M9903 Segmental and somatic dysfunction of lumbar region: Secondary | ICD-10-CM | POA: Diagnosis not present

## 2017-09-07 DIAGNOSIS — G542 Cervical root disorders, not elsewhere classified: Secondary | ICD-10-CM | POA: Diagnosis not present

## 2017-09-07 DIAGNOSIS — M9904 Segmental and somatic dysfunction of sacral region: Secondary | ICD-10-CM | POA: Diagnosis not present

## 2017-09-08 DIAGNOSIS — M9903 Segmental and somatic dysfunction of lumbar region: Secondary | ICD-10-CM | POA: Diagnosis not present

## 2017-09-08 DIAGNOSIS — G544 Lumbosacral root disorders, not elsewhere classified: Secondary | ICD-10-CM | POA: Diagnosis not present

## 2017-09-08 DIAGNOSIS — M9904 Segmental and somatic dysfunction of sacral region: Secondary | ICD-10-CM | POA: Diagnosis not present

## 2017-09-08 DIAGNOSIS — M9905 Segmental and somatic dysfunction of pelvic region: Secondary | ICD-10-CM | POA: Diagnosis not present

## 2017-09-14 DIAGNOSIS — M9903 Segmental and somatic dysfunction of lumbar region: Secondary | ICD-10-CM | POA: Diagnosis not present

## 2017-09-14 DIAGNOSIS — M9905 Segmental and somatic dysfunction of pelvic region: Secondary | ICD-10-CM | POA: Diagnosis not present

## 2017-09-14 DIAGNOSIS — M9904 Segmental and somatic dysfunction of sacral region: Secondary | ICD-10-CM | POA: Diagnosis not present

## 2017-09-14 DIAGNOSIS — G544 Lumbosacral root disorders, not elsewhere classified: Secondary | ICD-10-CM | POA: Diagnosis not present

## 2017-09-15 DIAGNOSIS — M9903 Segmental and somatic dysfunction of lumbar region: Secondary | ICD-10-CM | POA: Diagnosis not present

## 2017-09-15 DIAGNOSIS — G544 Lumbosacral root disorders, not elsewhere classified: Secondary | ICD-10-CM | POA: Diagnosis not present

## 2017-09-15 DIAGNOSIS — M9904 Segmental and somatic dysfunction of sacral region: Secondary | ICD-10-CM | POA: Diagnosis not present

## 2017-09-15 DIAGNOSIS — M9905 Segmental and somatic dysfunction of pelvic region: Secondary | ICD-10-CM | POA: Diagnosis not present

## 2017-09-19 DIAGNOSIS — M9905 Segmental and somatic dysfunction of pelvic region: Secondary | ICD-10-CM | POA: Diagnosis not present

## 2017-09-19 DIAGNOSIS — M9904 Segmental and somatic dysfunction of sacral region: Secondary | ICD-10-CM | POA: Diagnosis not present

## 2017-09-19 DIAGNOSIS — G544 Lumbosacral root disorders, not elsewhere classified: Secondary | ICD-10-CM | POA: Diagnosis not present

## 2017-09-19 DIAGNOSIS — M9903 Segmental and somatic dysfunction of lumbar region: Secondary | ICD-10-CM | POA: Diagnosis not present

## 2017-09-21 DIAGNOSIS — M9905 Segmental and somatic dysfunction of pelvic region: Secondary | ICD-10-CM | POA: Diagnosis not present

## 2017-09-21 DIAGNOSIS — G544 Lumbosacral root disorders, not elsewhere classified: Secondary | ICD-10-CM | POA: Diagnosis not present

## 2017-09-21 DIAGNOSIS — M9904 Segmental and somatic dysfunction of sacral region: Secondary | ICD-10-CM | POA: Diagnosis not present

## 2017-09-21 DIAGNOSIS — M9903 Segmental and somatic dysfunction of lumbar region: Secondary | ICD-10-CM | POA: Diagnosis not present

## 2017-09-25 DIAGNOSIS — G544 Lumbosacral root disorders, not elsewhere classified: Secondary | ICD-10-CM | POA: Diagnosis not present

## 2017-09-25 DIAGNOSIS — M9903 Segmental and somatic dysfunction of lumbar region: Secondary | ICD-10-CM | POA: Diagnosis not present

## 2017-09-25 DIAGNOSIS — M9905 Segmental and somatic dysfunction of pelvic region: Secondary | ICD-10-CM | POA: Diagnosis not present

## 2017-09-25 DIAGNOSIS — M9904 Segmental and somatic dysfunction of sacral region: Secondary | ICD-10-CM | POA: Diagnosis not present

## 2017-09-27 DIAGNOSIS — M9903 Segmental and somatic dysfunction of lumbar region: Secondary | ICD-10-CM | POA: Diagnosis not present

## 2017-09-27 DIAGNOSIS — M9904 Segmental and somatic dysfunction of sacral region: Secondary | ICD-10-CM | POA: Diagnosis not present

## 2017-09-27 DIAGNOSIS — G544 Lumbosacral root disorders, not elsewhere classified: Secondary | ICD-10-CM | POA: Diagnosis not present

## 2017-09-27 DIAGNOSIS — M9905 Segmental and somatic dysfunction of pelvic region: Secondary | ICD-10-CM | POA: Diagnosis not present

## 2017-09-29 DIAGNOSIS — M9903 Segmental and somatic dysfunction of lumbar region: Secondary | ICD-10-CM | POA: Diagnosis not present

## 2017-09-29 DIAGNOSIS — M9904 Segmental and somatic dysfunction of sacral region: Secondary | ICD-10-CM | POA: Diagnosis not present

## 2017-09-29 DIAGNOSIS — M9905 Segmental and somatic dysfunction of pelvic region: Secondary | ICD-10-CM | POA: Diagnosis not present

## 2017-09-29 DIAGNOSIS — G544 Lumbosacral root disorders, not elsewhere classified: Secondary | ICD-10-CM | POA: Diagnosis not present

## 2017-10-02 DIAGNOSIS — M9903 Segmental and somatic dysfunction of lumbar region: Secondary | ICD-10-CM | POA: Diagnosis not present

## 2017-10-02 DIAGNOSIS — G544 Lumbosacral root disorders, not elsewhere classified: Secondary | ICD-10-CM | POA: Diagnosis not present

## 2017-10-02 DIAGNOSIS — M9905 Segmental and somatic dysfunction of pelvic region: Secondary | ICD-10-CM | POA: Diagnosis not present

## 2017-10-02 DIAGNOSIS — M9904 Segmental and somatic dysfunction of sacral region: Secondary | ICD-10-CM | POA: Diagnosis not present

## 2017-10-03 DIAGNOSIS — M9904 Segmental and somatic dysfunction of sacral region: Secondary | ICD-10-CM | POA: Diagnosis not present

## 2017-10-03 DIAGNOSIS — M9905 Segmental and somatic dysfunction of pelvic region: Secondary | ICD-10-CM | POA: Diagnosis not present

## 2017-10-03 DIAGNOSIS — M9903 Segmental and somatic dysfunction of lumbar region: Secondary | ICD-10-CM | POA: Diagnosis not present

## 2017-10-03 DIAGNOSIS — G544 Lumbosacral root disorders, not elsewhere classified: Secondary | ICD-10-CM | POA: Diagnosis not present

## 2017-10-05 DIAGNOSIS — G544 Lumbosacral root disorders, not elsewhere classified: Secondary | ICD-10-CM | POA: Diagnosis not present

## 2017-10-05 DIAGNOSIS — M9904 Segmental and somatic dysfunction of sacral region: Secondary | ICD-10-CM | POA: Diagnosis not present

## 2017-10-05 DIAGNOSIS — M9903 Segmental and somatic dysfunction of lumbar region: Secondary | ICD-10-CM | POA: Diagnosis not present

## 2017-10-05 DIAGNOSIS — M9905 Segmental and somatic dysfunction of pelvic region: Secondary | ICD-10-CM | POA: Diagnosis not present

## 2017-10-10 DIAGNOSIS — G544 Lumbosacral root disorders, not elsewhere classified: Secondary | ICD-10-CM | POA: Diagnosis not present

## 2017-10-10 DIAGNOSIS — M9903 Segmental and somatic dysfunction of lumbar region: Secondary | ICD-10-CM | POA: Diagnosis not present

## 2017-10-10 DIAGNOSIS — M9905 Segmental and somatic dysfunction of pelvic region: Secondary | ICD-10-CM | POA: Diagnosis not present

## 2017-10-10 DIAGNOSIS — M9904 Segmental and somatic dysfunction of sacral region: Secondary | ICD-10-CM | POA: Diagnosis not present

## 2017-10-11 DIAGNOSIS — G544 Lumbosacral root disorders, not elsewhere classified: Secondary | ICD-10-CM | POA: Diagnosis not present

## 2017-10-11 DIAGNOSIS — M9903 Segmental and somatic dysfunction of lumbar region: Secondary | ICD-10-CM | POA: Diagnosis not present

## 2017-10-11 DIAGNOSIS — M9905 Segmental and somatic dysfunction of pelvic region: Secondary | ICD-10-CM | POA: Diagnosis not present

## 2017-10-11 DIAGNOSIS — M9904 Segmental and somatic dysfunction of sacral region: Secondary | ICD-10-CM | POA: Diagnosis not present

## 2017-10-13 DIAGNOSIS — G544 Lumbosacral root disorders, not elsewhere classified: Secondary | ICD-10-CM | POA: Diagnosis not present

## 2017-10-13 DIAGNOSIS — M9905 Segmental and somatic dysfunction of pelvic region: Secondary | ICD-10-CM | POA: Diagnosis not present

## 2017-10-13 DIAGNOSIS — M9903 Segmental and somatic dysfunction of lumbar region: Secondary | ICD-10-CM | POA: Diagnosis not present

## 2017-10-13 DIAGNOSIS — M9904 Segmental and somatic dysfunction of sacral region: Secondary | ICD-10-CM | POA: Diagnosis not present

## 2017-10-16 DIAGNOSIS — G544 Lumbosacral root disorders, not elsewhere classified: Secondary | ICD-10-CM | POA: Diagnosis not present

## 2017-10-16 DIAGNOSIS — M9905 Segmental and somatic dysfunction of pelvic region: Secondary | ICD-10-CM | POA: Diagnosis not present

## 2017-10-16 DIAGNOSIS — M9903 Segmental and somatic dysfunction of lumbar region: Secondary | ICD-10-CM | POA: Diagnosis not present

## 2017-10-16 DIAGNOSIS — M9904 Segmental and somatic dysfunction of sacral region: Secondary | ICD-10-CM | POA: Diagnosis not present

## 2017-10-17 DIAGNOSIS — M9904 Segmental and somatic dysfunction of sacral region: Secondary | ICD-10-CM | POA: Diagnosis not present

## 2017-10-17 DIAGNOSIS — M9903 Segmental and somatic dysfunction of lumbar region: Secondary | ICD-10-CM | POA: Diagnosis not present

## 2017-10-17 DIAGNOSIS — G544 Lumbosacral root disorders, not elsewhere classified: Secondary | ICD-10-CM | POA: Diagnosis not present

## 2017-10-17 DIAGNOSIS — M9905 Segmental and somatic dysfunction of pelvic region: Secondary | ICD-10-CM | POA: Diagnosis not present

## 2017-10-18 DIAGNOSIS — M9905 Segmental and somatic dysfunction of pelvic region: Secondary | ICD-10-CM | POA: Diagnosis not present

## 2017-10-18 DIAGNOSIS — M9904 Segmental and somatic dysfunction of sacral region: Secondary | ICD-10-CM | POA: Diagnosis not present

## 2017-10-18 DIAGNOSIS — M9903 Segmental and somatic dysfunction of lumbar region: Secondary | ICD-10-CM | POA: Diagnosis not present

## 2017-10-18 DIAGNOSIS — G544 Lumbosacral root disorders, not elsewhere classified: Secondary | ICD-10-CM | POA: Diagnosis not present

## 2017-10-19 DIAGNOSIS — G544 Lumbosacral root disorders, not elsewhere classified: Secondary | ICD-10-CM | POA: Diagnosis not present

## 2017-10-19 DIAGNOSIS — M9904 Segmental and somatic dysfunction of sacral region: Secondary | ICD-10-CM | POA: Diagnosis not present

## 2017-10-19 DIAGNOSIS — M9905 Segmental and somatic dysfunction of pelvic region: Secondary | ICD-10-CM | POA: Diagnosis not present

## 2017-10-19 DIAGNOSIS — M9903 Segmental and somatic dysfunction of lumbar region: Secondary | ICD-10-CM | POA: Diagnosis not present

## 2017-10-23 DIAGNOSIS — M9904 Segmental and somatic dysfunction of sacral region: Secondary | ICD-10-CM | POA: Diagnosis not present

## 2017-10-23 DIAGNOSIS — M9905 Segmental and somatic dysfunction of pelvic region: Secondary | ICD-10-CM | POA: Diagnosis not present

## 2017-10-23 DIAGNOSIS — G544 Lumbosacral root disorders, not elsewhere classified: Secondary | ICD-10-CM | POA: Diagnosis not present

## 2017-10-23 DIAGNOSIS — M9903 Segmental and somatic dysfunction of lumbar region: Secondary | ICD-10-CM | POA: Diagnosis not present

## 2017-10-24 DIAGNOSIS — M9903 Segmental and somatic dysfunction of lumbar region: Secondary | ICD-10-CM | POA: Diagnosis not present

## 2017-10-24 DIAGNOSIS — G544 Lumbosacral root disorders, not elsewhere classified: Secondary | ICD-10-CM | POA: Diagnosis not present

## 2017-10-24 DIAGNOSIS — M9904 Segmental and somatic dysfunction of sacral region: Secondary | ICD-10-CM | POA: Diagnosis not present

## 2017-10-24 DIAGNOSIS — M9905 Segmental and somatic dysfunction of pelvic region: Secondary | ICD-10-CM | POA: Diagnosis not present

## 2017-10-25 DIAGNOSIS — M9904 Segmental and somatic dysfunction of sacral region: Secondary | ICD-10-CM | POA: Diagnosis not present

## 2017-10-25 DIAGNOSIS — M9905 Segmental and somatic dysfunction of pelvic region: Secondary | ICD-10-CM | POA: Diagnosis not present

## 2017-10-25 DIAGNOSIS — G544 Lumbosacral root disorders, not elsewhere classified: Secondary | ICD-10-CM | POA: Diagnosis not present

## 2017-10-25 DIAGNOSIS — M9903 Segmental and somatic dysfunction of lumbar region: Secondary | ICD-10-CM | POA: Diagnosis not present

## 2017-10-26 DIAGNOSIS — G544 Lumbosacral root disorders, not elsewhere classified: Secondary | ICD-10-CM | POA: Diagnosis not present

## 2017-10-26 DIAGNOSIS — M9905 Segmental and somatic dysfunction of pelvic region: Secondary | ICD-10-CM | POA: Diagnosis not present

## 2017-10-26 DIAGNOSIS — M9904 Segmental and somatic dysfunction of sacral region: Secondary | ICD-10-CM | POA: Diagnosis not present

## 2017-10-26 DIAGNOSIS — M9903 Segmental and somatic dysfunction of lumbar region: Secondary | ICD-10-CM | POA: Diagnosis not present

## 2017-10-27 DIAGNOSIS — G544 Lumbosacral root disorders, not elsewhere classified: Secondary | ICD-10-CM | POA: Diagnosis not present

## 2017-10-27 DIAGNOSIS — M9903 Segmental and somatic dysfunction of lumbar region: Secondary | ICD-10-CM | POA: Diagnosis not present

## 2017-10-27 DIAGNOSIS — M9904 Segmental and somatic dysfunction of sacral region: Secondary | ICD-10-CM | POA: Diagnosis not present

## 2017-10-27 DIAGNOSIS — M9905 Segmental and somatic dysfunction of pelvic region: Secondary | ICD-10-CM | POA: Diagnosis not present

## 2017-10-30 DIAGNOSIS — M9905 Segmental and somatic dysfunction of pelvic region: Secondary | ICD-10-CM | POA: Diagnosis not present

## 2017-10-30 DIAGNOSIS — M9904 Segmental and somatic dysfunction of sacral region: Secondary | ICD-10-CM | POA: Diagnosis not present

## 2017-10-30 DIAGNOSIS — M9903 Segmental and somatic dysfunction of lumbar region: Secondary | ICD-10-CM | POA: Diagnosis not present

## 2017-10-30 DIAGNOSIS — G544 Lumbosacral root disorders, not elsewhere classified: Secondary | ICD-10-CM | POA: Diagnosis not present

## 2017-10-31 DIAGNOSIS — M9904 Segmental and somatic dysfunction of sacral region: Secondary | ICD-10-CM | POA: Diagnosis not present

## 2017-10-31 DIAGNOSIS — M9903 Segmental and somatic dysfunction of lumbar region: Secondary | ICD-10-CM | POA: Diagnosis not present

## 2017-10-31 DIAGNOSIS — M9905 Segmental and somatic dysfunction of pelvic region: Secondary | ICD-10-CM | POA: Diagnosis not present

## 2017-10-31 DIAGNOSIS — G544 Lumbosacral root disorders, not elsewhere classified: Secondary | ICD-10-CM | POA: Diagnosis not present

## 2017-11-01 DIAGNOSIS — M9904 Segmental and somatic dysfunction of sacral region: Secondary | ICD-10-CM | POA: Diagnosis not present

## 2017-11-01 DIAGNOSIS — G543 Thoracic root disorders, not elsewhere classified: Secondary | ICD-10-CM | POA: Diagnosis not present

## 2017-11-01 DIAGNOSIS — M9903 Segmental and somatic dysfunction of lumbar region: Secondary | ICD-10-CM | POA: Diagnosis not present

## 2017-11-01 DIAGNOSIS — M9905 Segmental and somatic dysfunction of pelvic region: Secondary | ICD-10-CM | POA: Diagnosis not present

## 2017-11-02 DIAGNOSIS — M9903 Segmental and somatic dysfunction of lumbar region: Secondary | ICD-10-CM | POA: Diagnosis not present

## 2017-11-02 DIAGNOSIS — G544 Lumbosacral root disorders, not elsewhere classified: Secondary | ICD-10-CM | POA: Diagnosis not present

## 2017-11-02 DIAGNOSIS — M9905 Segmental and somatic dysfunction of pelvic region: Secondary | ICD-10-CM | POA: Diagnosis not present

## 2017-11-02 DIAGNOSIS — M9904 Segmental and somatic dysfunction of sacral region: Secondary | ICD-10-CM | POA: Diagnosis not present

## 2017-11-06 DIAGNOSIS — M9904 Segmental and somatic dysfunction of sacral region: Secondary | ICD-10-CM | POA: Diagnosis not present

## 2017-11-06 DIAGNOSIS — M9905 Segmental and somatic dysfunction of pelvic region: Secondary | ICD-10-CM | POA: Diagnosis not present

## 2017-11-06 DIAGNOSIS — G544 Lumbosacral root disorders, not elsewhere classified: Secondary | ICD-10-CM | POA: Diagnosis not present

## 2017-11-06 DIAGNOSIS — M9903 Segmental and somatic dysfunction of lumbar region: Secondary | ICD-10-CM | POA: Diagnosis not present

## 2017-11-07 ENCOUNTER — Other Ambulatory Visit: Payer: Self-pay | Admitting: Family Medicine

## 2017-11-07 DIAGNOSIS — M9904 Segmental and somatic dysfunction of sacral region: Secondary | ICD-10-CM | POA: Diagnosis not present

## 2017-11-07 DIAGNOSIS — M9903 Segmental and somatic dysfunction of lumbar region: Secondary | ICD-10-CM | POA: Diagnosis not present

## 2017-11-07 DIAGNOSIS — I10 Essential (primary) hypertension: Secondary | ICD-10-CM

## 2017-11-07 DIAGNOSIS — M9905 Segmental and somatic dysfunction of pelvic region: Secondary | ICD-10-CM | POA: Diagnosis not present

## 2017-11-07 DIAGNOSIS — G544 Lumbosacral root disorders, not elsewhere classified: Secondary | ICD-10-CM | POA: Diagnosis not present

## 2017-11-08 DIAGNOSIS — M9903 Segmental and somatic dysfunction of lumbar region: Secondary | ICD-10-CM | POA: Diagnosis not present

## 2017-11-08 DIAGNOSIS — G544 Lumbosacral root disorders, not elsewhere classified: Secondary | ICD-10-CM | POA: Diagnosis not present

## 2017-11-08 DIAGNOSIS — M9905 Segmental and somatic dysfunction of pelvic region: Secondary | ICD-10-CM | POA: Diagnosis not present

## 2017-11-08 DIAGNOSIS — M9904 Segmental and somatic dysfunction of sacral region: Secondary | ICD-10-CM | POA: Diagnosis not present

## 2017-11-09 DIAGNOSIS — G544 Lumbosacral root disorders, not elsewhere classified: Secondary | ICD-10-CM | POA: Diagnosis not present

## 2017-11-09 DIAGNOSIS — M9903 Segmental and somatic dysfunction of lumbar region: Secondary | ICD-10-CM | POA: Diagnosis not present

## 2017-11-09 DIAGNOSIS — M9904 Segmental and somatic dysfunction of sacral region: Secondary | ICD-10-CM | POA: Diagnosis not present

## 2017-11-09 DIAGNOSIS — M9905 Segmental and somatic dysfunction of pelvic region: Secondary | ICD-10-CM | POA: Diagnosis not present

## 2017-11-10 ENCOUNTER — Encounter: Payer: Self-pay | Admitting: Family Medicine

## 2017-11-10 ENCOUNTER — Ambulatory Visit (INDEPENDENT_AMBULATORY_CARE_PROVIDER_SITE_OTHER): Payer: Medicare Other | Admitting: Family Medicine

## 2017-11-10 VITALS — BP 120/84 | HR 58 | Temp 98.2°F | Ht 70.0 in | Wt 207.2 lb

## 2017-11-10 DIAGNOSIS — E119 Type 2 diabetes mellitus without complications: Secondary | ICD-10-CM | POA: Diagnosis not present

## 2017-11-10 DIAGNOSIS — Z5320 Procedure and treatment not carried out because of patient's decision for unspecified reasons: Secondary | ICD-10-CM

## 2017-11-10 DIAGNOSIS — Z0000 Encounter for general adult medical examination without abnormal findings: Secondary | ICD-10-CM

## 2017-11-10 DIAGNOSIS — Z23 Encounter for immunization: Secondary | ICD-10-CM

## 2017-11-10 HISTORY — DX: Procedure and treatment not carried out because of patient's decision for unspecified reasons: Z53.20

## 2017-11-10 LAB — COMPREHENSIVE METABOLIC PANEL
ALBUMIN: 4 g/dL (ref 3.5–5.2)
ALT: 38 U/L (ref 0–53)
AST: 39 U/L — AB (ref 0–37)
Alkaline Phosphatase: 45 U/L (ref 39–117)
BUN: 13 mg/dL (ref 6–23)
CALCIUM: 9.7 mg/dL (ref 8.4–10.5)
CO2: 30 mEq/L (ref 19–32)
CREATININE: 0.99 mg/dL (ref 0.40–1.50)
Chloride: 101 mEq/L (ref 96–112)
GFR: 96.8 mL/min (ref >60.00)
Glucose, Bld: 135 mg/dL — ABNORMAL HIGH (ref 70–99)
Potassium: 3.6 mEq/L (ref 3.5–5.1)
Sodium: 139 mEq/L (ref 135–145)
TOTAL PROTEIN: 7.4 g/dL (ref 6.0–8.3)
Total Bilirubin: 1.2 mg/dL (ref 0.2–1.2)

## 2017-11-10 LAB — LIPID PANEL
CHOLESTEROL: 237 mg/dL — AB (ref 0–200)
HDL: 57.9 mg/dL (ref >39.00)
LDL Cholesterol: 162 mg/dL — ABNORMAL HIGH (ref 0–99)
NonHDL: 179.57
TRIGLYCERIDES: 86 mg/dL (ref 0.0–149.0)
Total CHOL/HDL Ratio: 4
VLDL: 17.2 mg/dL (ref 0.0–40.0)

## 2017-11-10 LAB — HEMOGLOBIN A1C: HEMOGLOBIN A1C: 6.3 % (ref 4.6–6.5)

## 2017-11-10 LAB — COLOGUARD: Cologuard: NEGATIVE

## 2017-11-10 LAB — MICROALBUMIN / CREATININE URINE RATIO
Creatinine,U: 322.9 mg/dL
MICROALB UR: 1.5 mg/dL (ref 0.0–1.9)
Microalb Creat Ratio: 0.5 mg/g (ref 0.0–30.0)

## 2017-11-10 MED ORDER — LOSARTAN POTASSIUM-HCTZ 100-25 MG PO TABS: 1.0000 | ORAL_TABLET | Freq: Every day | ORAL | 1 refills | Status: DC

## 2017-11-10 NOTE — Patient Instructions (Addendum)
Give [place] 2-3 business days to get the results of your labs back.   Let me know if you change your mind about your flu shot or shingles vaccine.  We are getting the Cologard set up again.  Call your eye provider for your yearly appointment.   Keep the diet clean and stay active.  Let us know if you are having continued issues with your new blood pressure medicine or if it is too costly.   Let us know if you need anything.

## 2017-11-10 NOTE — Progress Notes (Signed)
Chief Complaint  Patient presents with  . Annual Exam    Well Male Maxwell Hanson is here for a complete physical.   His last physical was >1 year ago.  Current diet: in general, a "healthy" diet.   Current exercise: walking Weight trend: losing weight intentionally Daytime fatigue? No. Seat belt? Yes.    Health maintenance Shingrix- No Colonoscopy- No Tetanus- Yes Hep C- Yes Pneumonia vaccine- Needs PCV23  Past Medical History:  Diagnosis Date  . DDD (degenerative disc disease), lumbar   . Diabetes (HCC)   . Hyperlipidemia   . Hypertension     Past Surgical History:  Procedure Laterality Date  . HYDROCELE EXCISION / REPAIR    . KIDNEY STONE SURGERY  2013  . TOOTH EXTRACTION     Full Dentures   Medications  Current Outpatient Medications on File Prior to Visit  Medication Sig Dispense Refill  . aspirin 81 MG tablet Take 81 mg by mouth daily.     Allergies Allergies  Allergen Reactions  . Metformin And Related Rash    Caused Exacerbation in Psoriasis [skin dry & peeled]    Family History Family History  Problem Relation Age of Onset  . Hypertension Mother        Living  . Arthritis Mother   . Stroke Father 67       Deceased  . Alcohol abuse Father   . Heart disease Sister        #1  . Hypertension Sister        #5  . Diabetes Brother        Deceased  . Stroke Brother        #2  . Alcohol abuse Brother        #2  . Throat cancer Brother        #3  . Arthritis/Rheumatoid Daughter   . Healthy Daughter        #2  . Alcohol abuse Son   . Heart attack Neg Hx     Review of Systems: Constitutional:  no fevers or chills Eye:  no recent significant change in vision Ear/Nose/Mouth/Throat:  Ears:  no recent hearing loss Nose/Mouth/Throat:  no complaints of nasal congestion or sore throat Cardiovascular:  no chest pain, no palpitations Respiratory:  no cough and no shortness of breath Gastrointestinal:  no abdominal pain, no change in bowel habits GU:   Male: negative for dysuria, frequency, and incontinence and negative for prostate symptoms Musculoskeletal/Extremities:  no pain, redness, or swelling of the joints Integumentary (Skin): L thumb bump; otherwise no abnormal skin lesions reported Neurologic:  no headaches, Endocrine:  No unexpected weight changes Hematologic/Lymphatic:  no areas of easy bruising  Exam BP 120/84 (BP Location: Left Arm, Patient Position: Sitting, Cuff Size: Large)   Pulse (!) 58   Temp 98.2 F (36.8 C) (Oral)   Ht 5\' 10"  (1.778 m)   Wt 207 lb 4 oz (94 kg)   SpO2 96%   BMI 29.74 kg/m  General:  well developed, well nourished, in no apparent distress Skin: over lateral portion of 1st digit on L, prox phalanx, there is a freely moveable and well circumscribed elliptical lesion that measures approx 0.6 x 0.4 cm in dimension, no fluctuance, ttp, erythema, drainage; otherwise no significant moles, warts, or growths Head:  no masses, lesions, or tenderness Eyes:  pupils equal and round, sclera anicteric without injection Ears:  canals without lesions, TMs shiny without retraction, no obvious effusion, no erythema Nose:  nares patent, septum midline, mucosa normal Throat/Pharynx:  lips and gingiva without lesion; tongue and uvula midline; non-inflamed pharynx; no exudates or postnasal drainage Neck: neck supple without adenopathy, thyromegaly, or masses Lungs:  clear to auscultation, breath sounds equal bilaterally, no respiratory distress Cardio:  regular rate and rhythm, no LE edema or bruits Abdomen:  abdomen soft, nontender; bowel sounds normal; no masses or organomegaly Genital (male): Nml penis, no lesions or discharge; testes present bilaterally without masses or tenderness Rectal: Deferred Musculoskeletal:  symmetrical muscle groups noted without atrophy or deformity Extremities:  no clubbing, cyanosis, or edema, no deformities, no skin discoloration Neuro:  gait normal; deep tendon reflexes normal and  symmetric Psych: well oriented with normal range of affect and appropriate judgment/insight  Assessment and Plan  Well adult exam - Plan: Hemoglobin A1c, Lipid panel, Comprehensive metabolic panel  Type 2 diabetes mellitus without complication, without long-term current use of insulin (HCC) - Plan: HM DIABETES FOOT EXAM, Microalbumin / creatinine urine ratio  Need for vaccination against Streptococcus pneumoniae - Plan: Pneumococcal polysaccharide vaccine 23-valent greater than or equal to 2yo subcutaneous/IM   Well 67 y.o. male. Counseled on diet and exercise. Refused flu and Shingrix.  Cologard.  Will contact eye provider. Pt wishes to change BP meds due to ED.  Other orders as above. Follow up in 6 mo pending above.  The patient voiced understanding and agreement to the plan.  Jilda Roche [place], DO 11/10/17 7:36 AM

## 2017-11-10 NOTE — Progress Notes (Signed)
Pre visit review using our clinic review tool, if applicable. No additional management support is needed unless otherwise documented below in the visit note. 

## 2017-11-13 ENCOUNTER — Telehealth: Payer: Self-pay | Admitting: Family Medicine

## 2017-11-13 DIAGNOSIS — M9905 Segmental and somatic dysfunction of pelvic region: Secondary | ICD-10-CM | POA: Diagnosis not present

## 2017-11-13 DIAGNOSIS — G544 Lumbosacral root disorders, not elsewhere classified: Secondary | ICD-10-CM | POA: Diagnosis not present

## 2017-11-13 DIAGNOSIS — M9903 Segmental and somatic dysfunction of lumbar region: Secondary | ICD-10-CM | POA: Diagnosis not present

## 2017-11-13 DIAGNOSIS — M9904 Segmental and somatic dysfunction of sacral region: Secondary | ICD-10-CM | POA: Diagnosis not present

## 2017-11-13 MED ORDER — LOSARTAN POTASSIUM 100 MG PO TABS: 100.0000 mg | ORAL_TABLET | Freq: Every day | ORAL | 3 refills | Status: DC

## 2017-11-13 MED ORDER — HYDROCHLOROTHIAZIDE 25 MG PO TABS: 25.0000 mg | ORAL_TABLET | Freq: Every day | ORAL | 3 refills | Status: DC

## 2017-11-13 NOTE — Telephone Encounter (Signed)
That is fine.  Losartan 100 mg daily.  Hydrochlorothiazide 25 mg daily.  Please adjust med list if pharmacy still having this issue.

## 2017-11-13 NOTE — Telephone Encounter (Signed)
Copied from CRM [id number]. Topic: Quick Communication - Rx Refill/Question >> Nov 13, 2017 10:21 AM Maia Petties wrote: Medication: losartan-hydrochlorothiazide (HYZAAR) 100-25 MG tablet  Pharmacy has informed pt that they do not have combo medication in stock but they can provide as 2 separate medications - please advise.  Has the patient contacted their pharmacy? yes Preferred Pharmacy (with phone number or street name): [id number] - [place], [place] - [address] [phone] (Phone) [phone] (Fax)

## 2017-11-13 NOTE — Telephone Encounter (Signed)
Advise on request. 

## 2017-11-13 NOTE — Telephone Encounter (Signed)
Updated list/sent in 2 meds.

## 2017-11-13 NOTE — Addendum Note (Signed)
Addended by: Scharlene Gloss B on: 11/13/2017 12:36 PM   Modules accepted: Orders

## 2017-11-15 DIAGNOSIS — M9905 Segmental and somatic dysfunction of pelvic region: Secondary | ICD-10-CM | POA: Diagnosis not present

## 2017-11-15 DIAGNOSIS — M9904 Segmental and somatic dysfunction of sacral region: Secondary | ICD-10-CM | POA: Diagnosis not present

## 2017-11-15 DIAGNOSIS — M9903 Segmental and somatic dysfunction of lumbar region: Secondary | ICD-10-CM | POA: Diagnosis not present

## 2017-11-15 DIAGNOSIS — G544 Lumbosacral root disorders, not elsewhere classified: Secondary | ICD-10-CM | POA: Diagnosis not present

## 2017-11-16 DIAGNOSIS — M9903 Segmental and somatic dysfunction of lumbar region: Secondary | ICD-10-CM | POA: Diagnosis not present

## 2017-11-16 DIAGNOSIS — M9905 Segmental and somatic dysfunction of pelvic region: Secondary | ICD-10-CM | POA: Diagnosis not present

## 2017-11-16 DIAGNOSIS — M9904 Segmental and somatic dysfunction of sacral region: Secondary | ICD-10-CM | POA: Diagnosis not present

## 2017-11-16 DIAGNOSIS — G544 Lumbosacral root disorders, not elsewhere classified: Secondary | ICD-10-CM | POA: Diagnosis not present

## 2017-11-20 DIAGNOSIS — M9904 Segmental and somatic dysfunction of sacral region: Secondary | ICD-10-CM | POA: Diagnosis not present

## 2017-11-20 DIAGNOSIS — M9903 Segmental and somatic dysfunction of lumbar region: Secondary | ICD-10-CM | POA: Diagnosis not present

## 2017-11-20 DIAGNOSIS — M9905 Segmental and somatic dysfunction of pelvic region: Secondary | ICD-10-CM | POA: Diagnosis not present

## 2017-11-20 DIAGNOSIS — G544 Lumbosacral root disorders, not elsewhere classified: Secondary | ICD-10-CM | POA: Diagnosis not present

## 2017-11-21 DIAGNOSIS — Z1211 Encounter for screening for malignant neoplasm of colon: Secondary | ICD-10-CM | POA: Diagnosis not present

## 2017-11-21 DIAGNOSIS — M9903 Segmental and somatic dysfunction of lumbar region: Secondary | ICD-10-CM | POA: Diagnosis not present

## 2017-11-21 DIAGNOSIS — G544 Lumbosacral root disorders, not elsewhere classified: Secondary | ICD-10-CM | POA: Diagnosis not present

## 2017-11-21 DIAGNOSIS — M9905 Segmental and somatic dysfunction of pelvic region: Secondary | ICD-10-CM | POA: Diagnosis not present

## 2017-11-21 DIAGNOSIS — M9904 Segmental and somatic dysfunction of sacral region: Secondary | ICD-10-CM | POA: Diagnosis not present

## 2017-11-22 DIAGNOSIS — M9905 Segmental and somatic dysfunction of pelvic region: Secondary | ICD-10-CM | POA: Diagnosis not present

## 2017-11-22 DIAGNOSIS — M9903 Segmental and somatic dysfunction of lumbar region: Secondary | ICD-10-CM | POA: Diagnosis not present

## 2017-11-22 DIAGNOSIS — G544 Lumbosacral root disorders, not elsewhere classified: Secondary | ICD-10-CM | POA: Diagnosis not present

## 2017-11-22 DIAGNOSIS — M9904 Segmental and somatic dysfunction of sacral region: Secondary | ICD-10-CM | POA: Diagnosis not present

## 2017-11-23 DIAGNOSIS — M9901 Segmental and somatic dysfunction of cervical region: Secondary | ICD-10-CM | POA: Diagnosis not present

## 2017-11-23 DIAGNOSIS — G542 Cervical root disorders, not elsewhere classified: Secondary | ICD-10-CM | POA: Diagnosis not present

## 2017-11-23 DIAGNOSIS — M9902 Segmental and somatic dysfunction of thoracic region: Secondary | ICD-10-CM | POA: Diagnosis not present

## 2017-11-23 DIAGNOSIS — M9903 Segmental and somatic dysfunction of lumbar region: Secondary | ICD-10-CM | POA: Diagnosis not present

## 2017-11-28 DIAGNOSIS — M9902 Segmental and somatic dysfunction of thoracic region: Secondary | ICD-10-CM | POA: Diagnosis not present

## 2017-11-28 DIAGNOSIS — M9903 Segmental and somatic dysfunction of lumbar region: Secondary | ICD-10-CM | POA: Diagnosis not present

## 2017-11-28 DIAGNOSIS — M9900 Segmental and somatic dysfunction of head region: Secondary | ICD-10-CM | POA: Diagnosis not present

## 2017-11-28 DIAGNOSIS — M9901 Segmental and somatic dysfunction of cervical region: Secondary | ICD-10-CM | POA: Diagnosis not present

## 2017-11-28 DIAGNOSIS — M9904 Segmental and somatic dysfunction of sacral region: Secondary | ICD-10-CM | POA: Diagnosis not present

## 2017-11-29 ENCOUNTER — Encounter: Payer: Self-pay | Admitting: Family Medicine

## 2017-11-29 ENCOUNTER — Telehealth: Payer: Self-pay | Admitting: Family Medicine

## 2017-11-29 DIAGNOSIS — M9903 Segmental and somatic dysfunction of lumbar region: Secondary | ICD-10-CM | POA: Diagnosis not present

## 2017-11-29 DIAGNOSIS — M9905 Segmental and somatic dysfunction of pelvic region: Secondary | ICD-10-CM | POA: Diagnosis not present

## 2017-11-29 DIAGNOSIS — G544 Lumbosacral root disorders, not elsewhere classified: Secondary | ICD-10-CM | POA: Diagnosis not present

## 2017-11-29 DIAGNOSIS — M9904 Segmental and somatic dysfunction of sacral region: Secondary | ICD-10-CM | POA: Diagnosis not present

## 2017-11-29 NOTE — Telephone Encounter (Signed)
cologuard results were negative///put in health maintenance////abstracted results as well//called the patient informed of results.

## 2017-11-30 DIAGNOSIS — G544 Lumbosacral root disorders, not elsewhere classified: Secondary | ICD-10-CM | POA: Diagnosis not present

## 2017-11-30 DIAGNOSIS — M9905 Segmental and somatic dysfunction of pelvic region: Secondary | ICD-10-CM | POA: Diagnosis not present

## 2017-11-30 DIAGNOSIS — M9903 Segmental and somatic dysfunction of lumbar region: Secondary | ICD-10-CM | POA: Diagnosis not present

## 2017-11-30 DIAGNOSIS — M9904 Segmental and somatic dysfunction of sacral region: Secondary | ICD-10-CM | POA: Diagnosis not present

## 2017-12-04 ENCOUNTER — Telehealth: Payer: Self-pay | Admitting: *Deleted

## 2017-12-04 DIAGNOSIS — M9904 Segmental and somatic dysfunction of sacral region: Secondary | ICD-10-CM | POA: Diagnosis not present

## 2017-12-04 DIAGNOSIS — M9905 Segmental and somatic dysfunction of pelvic region: Secondary | ICD-10-CM | POA: Diagnosis not present

## 2017-12-04 DIAGNOSIS — M9903 Segmental and somatic dysfunction of lumbar region: Secondary | ICD-10-CM | POA: Diagnosis not present

## 2017-12-04 DIAGNOSIS — G544 Lumbosacral root disorders, not elsewhere classified: Secondary | ICD-10-CM | POA: Diagnosis not present

## 2017-12-04 NOTE — Telephone Encounter (Signed)
Copied from CRM [id number]. Topic: Quick Communication - See Telephone Encounter >> Dec 04, 2017 11:07 AM Jay Schlichter wrote: CRM for notification. See Telephone encounter for: 12/04/17. Pt got emmi call - he sent in his colorguard screening. He is just letting us know

## 2017-12-04 NOTE — Telephone Encounter (Signed)
Maxwell Hanson -- it looks like Cologuard was abstracted in his chart and there is a phone note dated 11/29/17 where he was notified of result. What else needs to be done?

## 2017-12-05 DIAGNOSIS — M9901 Segmental and somatic dysfunction of cervical region: Secondary | ICD-10-CM | POA: Diagnosis not present

## 2017-12-05 DIAGNOSIS — M9903 Segmental and somatic dysfunction of lumbar region: Secondary | ICD-10-CM | POA: Diagnosis not present

## 2017-12-05 DIAGNOSIS — M9902 Segmental and somatic dysfunction of thoracic region: Secondary | ICD-10-CM | POA: Diagnosis not present

## 2017-12-05 DIAGNOSIS — G542 Cervical root disorders, not elsewhere classified: Secondary | ICD-10-CM | POA: Diagnosis not present

## 2017-12-05 NOTE — Telephone Encounter (Signed)
Author phoned pt., no need for additional follow-up at this time.

## 2017-12-05 NOTE — Telephone Encounter (Signed)
Author phoned pt. To f/u cologuard results. Pt. stated he already received them and did not have any questions. Appointment reminder for 11/4 given, and pt. Appreciative.

## 2017-12-06 DIAGNOSIS — M9905 Segmental and somatic dysfunction of pelvic region: Secondary | ICD-10-CM | POA: Diagnosis not present

## 2017-12-06 DIAGNOSIS — M9903 Segmental and somatic dysfunction of lumbar region: Secondary | ICD-10-CM | POA: Diagnosis not present

## 2017-12-06 DIAGNOSIS — M9904 Segmental and somatic dysfunction of sacral region: Secondary | ICD-10-CM | POA: Diagnosis not present

## 2017-12-06 DIAGNOSIS — G544 Lumbosacral root disorders, not elsewhere classified: Secondary | ICD-10-CM | POA: Diagnosis not present

## 2017-12-07 DIAGNOSIS — M9904 Segmental and somatic dysfunction of sacral region: Secondary | ICD-10-CM | POA: Diagnosis not present

## 2017-12-07 DIAGNOSIS — M9903 Segmental and somatic dysfunction of lumbar region: Secondary | ICD-10-CM | POA: Diagnosis not present

## 2017-12-07 DIAGNOSIS — M9905 Segmental and somatic dysfunction of pelvic region: Secondary | ICD-10-CM | POA: Diagnosis not present

## 2017-12-07 DIAGNOSIS — G544 Lumbosacral root disorders, not elsewhere classified: Secondary | ICD-10-CM | POA: Diagnosis not present

## 2017-12-11 ENCOUNTER — Ambulatory Visit: Payer: Medicare Other | Admitting: Family Medicine

## 2017-12-11 ENCOUNTER — Encounter: Payer: Self-pay | Admitting: Family Medicine

## 2017-12-11 VITALS — BP 120/80 | HR 60 | Temp 98.3°F | Ht 70.0 in | Wt 213.5 lb

## 2017-12-11 DIAGNOSIS — L720 Epidermal cyst: Secondary | ICD-10-CM

## 2017-12-11 DIAGNOSIS — M9904 Segmental and somatic dysfunction of sacral region: Secondary | ICD-10-CM | POA: Diagnosis not present

## 2017-12-11 DIAGNOSIS — M9905 Segmental and somatic dysfunction of pelvic region: Secondary | ICD-10-CM | POA: Diagnosis not present

## 2017-12-11 DIAGNOSIS — M9903 Segmental and somatic dysfunction of lumbar region: Secondary | ICD-10-CM | POA: Diagnosis not present

## 2017-12-11 DIAGNOSIS — G544 Lumbosacral root disorders, not elsewhere classified: Secondary | ICD-10-CM | POA: Diagnosis not present

## 2017-12-11 NOTE — Progress Notes (Signed)
Chief Complaint  Patient presents with  . Procedure    Maxwell Hanson is a 67 y.o. male here for a skin complaint.  He has had a cyst on his left thumb for many years.  It is bothersome to him and he would like it removed.  It is not painful or draining anything.  No fevers or redness.  ROS:  Const: No fevers Skin: As noted in HPI  Past Medical History:  Diagnosis Date  . DDD (degenerative disc disease), lumbar   . Diabetes (HCC)   . Hyperlipidemia   . Hypertension   . Refuses treatment 11/10/2017   statin    BP 120/80 (BP Location: Left Arm, Patient Position: Sitting, Cuff Size: Normal)   Pulse 60   Temp 98.3 F (36.8 C) (Oral)   Ht 5\' 10"  (1.778 m)   Wt 213 lb 8 oz (96.8 kg)   SpO2 97%   BMI 30.63 kg/m  Gen: awake, alert, appearing stated age Lungs: No accessory muscle use Skin: On the dorso-med portion of the left thumb, there is an elliptically shaped 0.8 x 0.4 cm lesion that is flesh-colored and raised.  It is not tender to palpation and feels semisolid in consistency.  There is no excoriation, erythema, tenderness to palpation, or excessive warmth. Psych: Age appropriate judgment and insight  Procedure note; incision and drainage Informed consent obtained. The area was cleaned with alcohol. The area was anesthetized with 1 mL of 1% lidocaine without epinephrine. Once adequate anesthesia was obtained, a linear incision was made with 11 blade scalpel. Sebum and serous fluid expressed with complete resolution of initial issue. Antibiotic ointment was placed followed by a dressing. There were no complications noted. The patient tolerated the procedure well.   Epidermoid cyst of finger - Plan: PR DRAIN SKIN ABSCESS SIMPLE  I thought we would have to excise the cyst, however the issue resolved itself after the initial cut.  Aftercare instructions verbalized and written down. F/u prn. The patient voiced understanding and agreement to the plan.  Jilda Roche [place],  DO 12/11/17 12:08 PM

## 2017-12-11 NOTE — Progress Notes (Signed)
Pre visit review using our clinic review tool, if applicable. No additional management support is needed unless otherwise documented below in the visit note. 

## 2017-12-11 NOTE — Patient Instructions (Addendum)
Do not shower for the rest of the day. When you do wash it, use only soap and water. Do not vigorously scrub. Apply triple antibiotic ointment (like Neosporin) twice daily. Keep the area clean and dry.   Things to look out for: increasing pain not relieved by ibuprofen/acetaminophen, fevers, spreading redness, drainage of pus, or foul odor.  Let us know if you need anything. 

## 2017-12-12 DIAGNOSIS — G544 Lumbosacral root disorders, not elsewhere classified: Secondary | ICD-10-CM | POA: Diagnosis not present

## 2017-12-12 DIAGNOSIS — M9904 Segmental and somatic dysfunction of sacral region: Secondary | ICD-10-CM | POA: Diagnosis not present

## 2017-12-12 DIAGNOSIS — M9905 Segmental and somatic dysfunction of pelvic region: Secondary | ICD-10-CM | POA: Diagnosis not present

## 2017-12-12 DIAGNOSIS — M9903 Segmental and somatic dysfunction of lumbar region: Secondary | ICD-10-CM | POA: Diagnosis not present

## 2017-12-13 DIAGNOSIS — M9904 Segmental and somatic dysfunction of sacral region: Secondary | ICD-10-CM | POA: Diagnosis not present

## 2017-12-13 DIAGNOSIS — M9905 Segmental and somatic dysfunction of pelvic region: Secondary | ICD-10-CM | POA: Diagnosis not present

## 2017-12-13 DIAGNOSIS — G544 Lumbosacral root disorders, not elsewhere classified: Secondary | ICD-10-CM | POA: Diagnosis not present

## 2017-12-13 DIAGNOSIS — M9903 Segmental and somatic dysfunction of lumbar region: Secondary | ICD-10-CM | POA: Diagnosis not present

## 2017-12-14 DIAGNOSIS — M9905 Segmental and somatic dysfunction of pelvic region: Secondary | ICD-10-CM | POA: Diagnosis not present

## 2017-12-14 DIAGNOSIS — M9903 Segmental and somatic dysfunction of lumbar region: Secondary | ICD-10-CM | POA: Diagnosis not present

## 2017-12-14 DIAGNOSIS — M9904 Segmental and somatic dysfunction of sacral region: Secondary | ICD-10-CM | POA: Diagnosis not present

## 2017-12-19 DIAGNOSIS — M9901 Segmental and somatic dysfunction of cervical region: Secondary | ICD-10-CM | POA: Diagnosis not present

## 2017-12-19 DIAGNOSIS — M9903 Segmental and somatic dysfunction of lumbar region: Secondary | ICD-10-CM | POA: Diagnosis not present

## 2017-12-19 DIAGNOSIS — M9904 Segmental and somatic dysfunction of sacral region: Secondary | ICD-10-CM | POA: Diagnosis not present

## 2017-12-19 DIAGNOSIS — G542 Cervical root disorders, not elsewhere classified: Secondary | ICD-10-CM | POA: Diagnosis not present

## 2017-12-25 DIAGNOSIS — M9904 Segmental and somatic dysfunction of sacral region: Secondary | ICD-10-CM | POA: Diagnosis not present

## 2017-12-25 DIAGNOSIS — G542 Cervical root disorders, not elsewhere classified: Secondary | ICD-10-CM | POA: Diagnosis not present

## 2017-12-25 DIAGNOSIS — M9903 Segmental and somatic dysfunction of lumbar region: Secondary | ICD-10-CM | POA: Diagnosis not present

## 2017-12-25 DIAGNOSIS — M9901 Segmental and somatic dysfunction of cervical region: Secondary | ICD-10-CM | POA: Diagnosis not present

## 2017-12-26 DIAGNOSIS — M9902 Segmental and somatic dysfunction of thoracic region: Secondary | ICD-10-CM | POA: Diagnosis not present

## 2017-12-26 DIAGNOSIS — M9903 Segmental and somatic dysfunction of lumbar region: Secondary | ICD-10-CM | POA: Diagnosis not present

## 2017-12-26 DIAGNOSIS — G542 Cervical root disorders, not elsewhere classified: Secondary | ICD-10-CM | POA: Diagnosis not present

## 2017-12-26 DIAGNOSIS — M9901 Segmental and somatic dysfunction of cervical region: Secondary | ICD-10-CM | POA: Diagnosis not present

## 2017-12-27 DIAGNOSIS — G544 Lumbosacral root disorders, not elsewhere classified: Secondary | ICD-10-CM | POA: Diagnosis not present

## 2017-12-27 DIAGNOSIS — M9904 Segmental and somatic dysfunction of sacral region: Secondary | ICD-10-CM | POA: Diagnosis not present

## 2017-12-27 DIAGNOSIS — M9905 Segmental and somatic dysfunction of pelvic region: Secondary | ICD-10-CM | POA: Diagnosis not present

## 2017-12-27 DIAGNOSIS — M9903 Segmental and somatic dysfunction of lumbar region: Secondary | ICD-10-CM | POA: Diagnosis not present

## 2017-12-28 DIAGNOSIS — M9903 Segmental and somatic dysfunction of lumbar region: Secondary | ICD-10-CM | POA: Diagnosis not present

## 2017-12-28 DIAGNOSIS — M9902 Segmental and somatic dysfunction of thoracic region: Secondary | ICD-10-CM | POA: Diagnosis not present

## 2017-12-28 DIAGNOSIS — M9901 Segmental and somatic dysfunction of cervical region: Secondary | ICD-10-CM | POA: Diagnosis not present

## 2017-12-28 DIAGNOSIS — G542 Cervical root disorders, not elsewhere classified: Secondary | ICD-10-CM | POA: Diagnosis not present

## 2018-01-01 DIAGNOSIS — M9905 Segmental and somatic dysfunction of pelvic region: Secondary | ICD-10-CM | POA: Diagnosis not present

## 2018-01-01 DIAGNOSIS — M9904 Segmental and somatic dysfunction of sacral region: Secondary | ICD-10-CM | POA: Diagnosis not present

## 2018-01-01 DIAGNOSIS — G544 Lumbosacral root disorders, not elsewhere classified: Secondary | ICD-10-CM | POA: Diagnosis not present

## 2018-01-01 DIAGNOSIS — M9903 Segmental and somatic dysfunction of lumbar region: Secondary | ICD-10-CM | POA: Diagnosis not present

## 2018-01-02 DIAGNOSIS — M9903 Segmental and somatic dysfunction of lumbar region: Secondary | ICD-10-CM | POA: Diagnosis not present

## 2018-01-02 DIAGNOSIS — G544 Lumbosacral root disorders, not elsewhere classified: Secondary | ICD-10-CM | POA: Diagnosis not present

## 2018-01-02 DIAGNOSIS — M9905 Segmental and somatic dysfunction of pelvic region: Secondary | ICD-10-CM | POA: Diagnosis not present

## 2018-01-02 DIAGNOSIS — M9904 Segmental and somatic dysfunction of sacral region: Secondary | ICD-10-CM | POA: Diagnosis not present

## 2018-01-03 DIAGNOSIS — G544 Lumbosacral root disorders, not elsewhere classified: Secondary | ICD-10-CM | POA: Diagnosis not present

## 2018-01-03 DIAGNOSIS — M9904 Segmental and somatic dysfunction of sacral region: Secondary | ICD-10-CM | POA: Diagnosis not present

## 2018-01-03 DIAGNOSIS — M9903 Segmental and somatic dysfunction of lumbar region: Secondary | ICD-10-CM | POA: Diagnosis not present

## 2018-01-03 DIAGNOSIS — M9905 Segmental and somatic dysfunction of pelvic region: Secondary | ICD-10-CM | POA: Diagnosis not present

## 2018-01-08 DIAGNOSIS — G542 Cervical root disorders, not elsewhere classified: Secondary | ICD-10-CM | POA: Diagnosis not present

## 2018-01-08 DIAGNOSIS — M9902 Segmental and somatic dysfunction of thoracic region: Secondary | ICD-10-CM | POA: Diagnosis not present

## 2018-01-08 DIAGNOSIS — M9901 Segmental and somatic dysfunction of cervical region: Secondary | ICD-10-CM | POA: Diagnosis not present

## 2018-01-08 DIAGNOSIS — M9903 Segmental and somatic dysfunction of lumbar region: Secondary | ICD-10-CM | POA: Diagnosis not present

## 2018-01-09 DIAGNOSIS — M9904 Segmental and somatic dysfunction of sacral region: Secondary | ICD-10-CM | POA: Diagnosis not present

## 2018-01-09 DIAGNOSIS — M9905 Segmental and somatic dysfunction of pelvic region: Secondary | ICD-10-CM | POA: Diagnosis not present

## 2018-01-09 DIAGNOSIS — M9903 Segmental and somatic dysfunction of lumbar region: Secondary | ICD-10-CM | POA: Diagnosis not present

## 2018-01-09 DIAGNOSIS — G544 Lumbosacral root disorders, not elsewhere classified: Secondary | ICD-10-CM | POA: Diagnosis not present

## 2018-01-10 DIAGNOSIS — M9903 Segmental and somatic dysfunction of lumbar region: Secondary | ICD-10-CM | POA: Diagnosis not present

## 2018-01-10 DIAGNOSIS — M9905 Segmental and somatic dysfunction of pelvic region: Secondary | ICD-10-CM | POA: Diagnosis not present

## 2018-01-10 DIAGNOSIS — G544 Lumbosacral root disorders, not elsewhere classified: Secondary | ICD-10-CM | POA: Diagnosis not present

## 2018-01-10 DIAGNOSIS — M9904 Segmental and somatic dysfunction of sacral region: Secondary | ICD-10-CM | POA: Diagnosis not present

## 2018-01-11 DIAGNOSIS — M9904 Segmental and somatic dysfunction of sacral region: Secondary | ICD-10-CM | POA: Diagnosis not present

## 2018-01-11 DIAGNOSIS — M9905 Segmental and somatic dysfunction of pelvic region: Secondary | ICD-10-CM | POA: Diagnosis not present

## 2018-01-11 DIAGNOSIS — G544 Lumbosacral root disorders, not elsewhere classified: Secondary | ICD-10-CM | POA: Diagnosis not present

## 2018-01-11 DIAGNOSIS — M9903 Segmental and somatic dysfunction of lumbar region: Secondary | ICD-10-CM | POA: Diagnosis not present

## 2018-01-15 DIAGNOSIS — M9902 Segmental and somatic dysfunction of thoracic region: Secondary | ICD-10-CM | POA: Diagnosis not present

## 2018-01-15 DIAGNOSIS — G542 Cervical root disorders, not elsewhere classified: Secondary | ICD-10-CM | POA: Diagnosis not present

## 2018-01-15 DIAGNOSIS — M9903 Segmental and somatic dysfunction of lumbar region: Secondary | ICD-10-CM | POA: Diagnosis not present

## 2018-01-15 DIAGNOSIS — M9901 Segmental and somatic dysfunction of cervical region: Secondary | ICD-10-CM | POA: Diagnosis not present

## 2018-01-16 DIAGNOSIS — M9902 Segmental and somatic dysfunction of thoracic region: Secondary | ICD-10-CM | POA: Diagnosis not present

## 2018-01-16 DIAGNOSIS — G542 Cervical root disorders, not elsewhere classified: Secondary | ICD-10-CM | POA: Diagnosis not present

## 2018-01-16 DIAGNOSIS — M9903 Segmental and somatic dysfunction of lumbar region: Secondary | ICD-10-CM | POA: Diagnosis not present

## 2018-01-16 DIAGNOSIS — M9901 Segmental and somatic dysfunction of cervical region: Secondary | ICD-10-CM | POA: Diagnosis not present

## 2018-01-17 DIAGNOSIS — G542 Cervical root disorders, not elsewhere classified: Secondary | ICD-10-CM | POA: Diagnosis not present

## 2018-01-17 DIAGNOSIS — M9903 Segmental and somatic dysfunction of lumbar region: Secondary | ICD-10-CM | POA: Diagnosis not present

## 2018-01-17 DIAGNOSIS — M9901 Segmental and somatic dysfunction of cervical region: Secondary | ICD-10-CM | POA: Diagnosis not present

## 2018-01-17 DIAGNOSIS — M9902 Segmental and somatic dysfunction of thoracic region: Secondary | ICD-10-CM | POA: Diagnosis not present

## 2018-01-18 DIAGNOSIS — G544 Lumbosacral root disorders, not elsewhere classified: Secondary | ICD-10-CM | POA: Diagnosis not present

## 2018-01-18 DIAGNOSIS — M9903 Segmental and somatic dysfunction of lumbar region: Secondary | ICD-10-CM | POA: Diagnosis not present

## 2018-01-18 DIAGNOSIS — M9905 Segmental and somatic dysfunction of pelvic region: Secondary | ICD-10-CM | POA: Diagnosis not present

## 2018-01-18 DIAGNOSIS — M9904 Segmental and somatic dysfunction of sacral region: Secondary | ICD-10-CM | POA: Diagnosis not present

## 2018-01-22 DIAGNOSIS — M9901 Segmental and somatic dysfunction of cervical region: Secondary | ICD-10-CM | POA: Diagnosis not present

## 2018-01-22 DIAGNOSIS — M9904 Segmental and somatic dysfunction of sacral region: Secondary | ICD-10-CM | POA: Diagnosis not present

## 2018-01-22 DIAGNOSIS — M9903 Segmental and somatic dysfunction of lumbar region: Secondary | ICD-10-CM | POA: Diagnosis not present

## 2018-01-22 DIAGNOSIS — G542 Cervical root disorders, not elsewhere classified: Secondary | ICD-10-CM | POA: Diagnosis not present

## 2018-01-23 DIAGNOSIS — G542 Cervical root disorders, not elsewhere classified: Secondary | ICD-10-CM | POA: Diagnosis not present

## 2018-01-23 DIAGNOSIS — M9901 Segmental and somatic dysfunction of cervical region: Secondary | ICD-10-CM | POA: Diagnosis not present

## 2018-01-23 DIAGNOSIS — M9902 Segmental and somatic dysfunction of thoracic region: Secondary | ICD-10-CM | POA: Diagnosis not present

## 2018-01-23 DIAGNOSIS — M9903 Segmental and somatic dysfunction of lumbar region: Secondary | ICD-10-CM | POA: Diagnosis not present

## 2018-01-24 DIAGNOSIS — M9904 Segmental and somatic dysfunction of sacral region: Secondary | ICD-10-CM | POA: Diagnosis not present

## 2018-01-24 DIAGNOSIS — G544 Lumbosacral root disorders, not elsewhere classified: Secondary | ICD-10-CM | POA: Diagnosis not present

## 2018-01-24 DIAGNOSIS — M9903 Segmental and somatic dysfunction of lumbar region: Secondary | ICD-10-CM | POA: Diagnosis not present

## 2018-01-24 DIAGNOSIS — M9905 Segmental and somatic dysfunction of pelvic region: Secondary | ICD-10-CM | POA: Diagnosis not present

## 2018-01-25 DIAGNOSIS — M9903 Segmental and somatic dysfunction of lumbar region: Secondary | ICD-10-CM | POA: Diagnosis not present

## 2018-01-25 DIAGNOSIS — M9902 Segmental and somatic dysfunction of thoracic region: Secondary | ICD-10-CM | POA: Diagnosis not present

## 2018-01-25 DIAGNOSIS — M9901 Segmental and somatic dysfunction of cervical region: Secondary | ICD-10-CM | POA: Diagnosis not present

## 2018-01-25 DIAGNOSIS — G542 Cervical root disorders, not elsewhere classified: Secondary | ICD-10-CM | POA: Diagnosis not present

## 2018-01-29 DIAGNOSIS — M9903 Segmental and somatic dysfunction of lumbar region: Secondary | ICD-10-CM | POA: Diagnosis not present

## 2018-01-29 DIAGNOSIS — M9901 Segmental and somatic dysfunction of cervical region: Secondary | ICD-10-CM | POA: Diagnosis not present

## 2018-01-29 DIAGNOSIS — M9902 Segmental and somatic dysfunction of thoracic region: Secondary | ICD-10-CM | POA: Diagnosis not present

## 2018-01-29 DIAGNOSIS — G542 Cervical root disorders, not elsewhere classified: Secondary | ICD-10-CM | POA: Diagnosis not present

## 2018-01-30 DIAGNOSIS — M9903 Segmental and somatic dysfunction of lumbar region: Secondary | ICD-10-CM | POA: Diagnosis not present

## 2018-01-30 DIAGNOSIS — G542 Cervical root disorders, not elsewhere classified: Secondary | ICD-10-CM | POA: Diagnosis not present

## 2018-01-30 DIAGNOSIS — M9902 Segmental and somatic dysfunction of thoracic region: Secondary | ICD-10-CM | POA: Diagnosis not present

## 2018-01-30 DIAGNOSIS — M9901 Segmental and somatic dysfunction of cervical region: Secondary | ICD-10-CM | POA: Diagnosis not present

## 2018-02-05 DIAGNOSIS — M9902 Segmental and somatic dysfunction of thoracic region: Secondary | ICD-10-CM | POA: Diagnosis not present

## 2018-02-05 DIAGNOSIS — G542 Cervical root disorders, not elsewhere classified: Secondary | ICD-10-CM | POA: Diagnosis not present

## 2018-02-05 DIAGNOSIS — M9903 Segmental and somatic dysfunction of lumbar region: Secondary | ICD-10-CM | POA: Diagnosis not present

## 2018-02-05 DIAGNOSIS — M9901 Segmental and somatic dysfunction of cervical region: Secondary | ICD-10-CM | POA: Diagnosis not present

## 2018-02-06 DIAGNOSIS — G542 Cervical root disorders, not elsewhere classified: Secondary | ICD-10-CM | POA: Diagnosis not present

## 2018-02-06 DIAGNOSIS — M9903 Segmental and somatic dysfunction of lumbar region: Secondary | ICD-10-CM | POA: Diagnosis not present

## 2018-02-06 DIAGNOSIS — M9904 Segmental and somatic dysfunction of sacral region: Secondary | ICD-10-CM | POA: Diagnosis not present

## 2018-02-06 DIAGNOSIS — M9901 Segmental and somatic dysfunction of cervical region: Secondary | ICD-10-CM | POA: Diagnosis not present

## 2018-02-08 DIAGNOSIS — M9903 Segmental and somatic dysfunction of lumbar region: Secondary | ICD-10-CM | POA: Diagnosis not present

## 2018-02-08 DIAGNOSIS — M9904 Segmental and somatic dysfunction of sacral region: Secondary | ICD-10-CM | POA: Diagnosis not present

## 2018-02-08 DIAGNOSIS — G544 Lumbosacral root disorders, not elsewhere classified: Secondary | ICD-10-CM | POA: Diagnosis not present

## 2018-02-08 DIAGNOSIS — M9905 Segmental and somatic dysfunction of pelvic region: Secondary | ICD-10-CM | POA: Diagnosis not present

## 2018-02-12 DIAGNOSIS — G544 Lumbosacral root disorders, not elsewhere classified: Secondary | ICD-10-CM | POA: Diagnosis not present

## 2018-02-12 DIAGNOSIS — M9904 Segmental and somatic dysfunction of sacral region: Secondary | ICD-10-CM | POA: Diagnosis not present

## 2018-02-12 DIAGNOSIS — M9905 Segmental and somatic dysfunction of pelvic region: Secondary | ICD-10-CM | POA: Diagnosis not present

## 2018-02-12 DIAGNOSIS — M9903 Segmental and somatic dysfunction of lumbar region: Secondary | ICD-10-CM | POA: Diagnosis not present

## 2018-02-13 DIAGNOSIS — M9902 Segmental and somatic dysfunction of thoracic region: Secondary | ICD-10-CM | POA: Diagnosis not present

## 2018-02-13 DIAGNOSIS — M9901 Segmental and somatic dysfunction of cervical region: Secondary | ICD-10-CM | POA: Diagnosis not present

## 2018-02-13 DIAGNOSIS — G542 Cervical root disorders, not elsewhere classified: Secondary | ICD-10-CM | POA: Diagnosis not present

## 2018-02-13 DIAGNOSIS — M9903 Segmental and somatic dysfunction of lumbar region: Secondary | ICD-10-CM | POA: Diagnosis not present

## 2018-02-14 ENCOUNTER — Other Ambulatory Visit: Payer: Self-pay | Admitting: Family Medicine

## 2018-02-14 DIAGNOSIS — G544 Lumbosacral root disorders, not elsewhere classified: Secondary | ICD-10-CM | POA: Diagnosis not present

## 2018-02-14 DIAGNOSIS — M9904 Segmental and somatic dysfunction of sacral region: Secondary | ICD-10-CM | POA: Diagnosis not present

## 2018-02-14 DIAGNOSIS — M9905 Segmental and somatic dysfunction of pelvic region: Secondary | ICD-10-CM | POA: Diagnosis not present

## 2018-02-14 DIAGNOSIS — M9903 Segmental and somatic dysfunction of lumbar region: Secondary | ICD-10-CM | POA: Diagnosis not present

## 2018-02-14 MED ORDER — LOSARTAN POTASSIUM 100 MG PO TABS: 100.0000 mg | ORAL_TABLET | Freq: Every day | ORAL | 3 refills | Status: DC

## 2018-02-14 MED ORDER — HYDROCHLOROTHIAZIDE 25 MG PO TABS: 25.0000 mg | ORAL_TABLET | Freq: Every day | ORAL | 3 refills | Status: DC

## 2018-02-15 DIAGNOSIS — M9904 Segmental and somatic dysfunction of sacral region: Secondary | ICD-10-CM | POA: Diagnosis not present

## 2018-02-15 DIAGNOSIS — G544 Lumbosacral root disorders, not elsewhere classified: Secondary | ICD-10-CM | POA: Diagnosis not present

## 2018-02-15 DIAGNOSIS — M9903 Segmental and somatic dysfunction of lumbar region: Secondary | ICD-10-CM | POA: Diagnosis not present

## 2018-02-15 DIAGNOSIS — M9905 Segmental and somatic dysfunction of pelvic region: Secondary | ICD-10-CM | POA: Diagnosis not present

## 2018-02-19 DIAGNOSIS — G544 Lumbosacral root disorders, not elsewhere classified: Secondary | ICD-10-CM | POA: Diagnosis not present

## 2018-02-19 DIAGNOSIS — M9905 Segmental and somatic dysfunction of pelvic region: Secondary | ICD-10-CM | POA: Diagnosis not present

## 2018-02-19 DIAGNOSIS — M9904 Segmental and somatic dysfunction of sacral region: Secondary | ICD-10-CM | POA: Diagnosis not present

## 2018-02-19 DIAGNOSIS — M9903 Segmental and somatic dysfunction of lumbar region: Secondary | ICD-10-CM | POA: Diagnosis not present

## 2018-02-20 DIAGNOSIS — G542 Cervical root disorders, not elsewhere classified: Secondary | ICD-10-CM | POA: Diagnosis not present

## 2018-02-20 DIAGNOSIS — M9901 Segmental and somatic dysfunction of cervical region: Secondary | ICD-10-CM | POA: Diagnosis not present

## 2018-02-20 DIAGNOSIS — M9902 Segmental and somatic dysfunction of thoracic region: Secondary | ICD-10-CM | POA: Diagnosis not present

## 2018-02-20 DIAGNOSIS — M9903 Segmental and somatic dysfunction of lumbar region: Secondary | ICD-10-CM | POA: Diagnosis not present

## 2018-02-21 DIAGNOSIS — M9904 Segmental and somatic dysfunction of sacral region: Secondary | ICD-10-CM | POA: Diagnosis not present

## 2018-02-21 DIAGNOSIS — M9903 Segmental and somatic dysfunction of lumbar region: Secondary | ICD-10-CM | POA: Diagnosis not present

## 2018-02-21 DIAGNOSIS — G544 Lumbosacral root disorders, not elsewhere classified: Secondary | ICD-10-CM | POA: Diagnosis not present

## 2018-02-21 DIAGNOSIS — M9905 Segmental and somatic dysfunction of pelvic region: Secondary | ICD-10-CM | POA: Diagnosis not present

## 2018-02-22 DIAGNOSIS — M9903 Segmental and somatic dysfunction of lumbar region: Secondary | ICD-10-CM | POA: Diagnosis not present

## 2018-02-22 DIAGNOSIS — G544 Lumbosacral root disorders, not elsewhere classified: Secondary | ICD-10-CM | POA: Diagnosis not present

## 2018-02-22 DIAGNOSIS — M9905 Segmental and somatic dysfunction of pelvic region: Secondary | ICD-10-CM | POA: Diagnosis not present

## 2018-02-22 DIAGNOSIS — M9904 Segmental and somatic dysfunction of sacral region: Secondary | ICD-10-CM | POA: Diagnosis not present

## 2018-02-26 DIAGNOSIS — M9904 Segmental and somatic dysfunction of sacral region: Secondary | ICD-10-CM | POA: Diagnosis not present

## 2018-02-26 DIAGNOSIS — G544 Lumbosacral root disorders, not elsewhere classified: Secondary | ICD-10-CM | POA: Diagnosis not present

## 2018-02-26 DIAGNOSIS — M9905 Segmental and somatic dysfunction of pelvic region: Secondary | ICD-10-CM | POA: Diagnosis not present

## 2018-02-26 DIAGNOSIS — M9903 Segmental and somatic dysfunction of lumbar region: Secondary | ICD-10-CM | POA: Diagnosis not present

## 2018-02-27 DIAGNOSIS — M9905 Segmental and somatic dysfunction of pelvic region: Secondary | ICD-10-CM | POA: Diagnosis not present

## 2018-02-27 DIAGNOSIS — M9903 Segmental and somatic dysfunction of lumbar region: Secondary | ICD-10-CM | POA: Diagnosis not present

## 2018-02-27 DIAGNOSIS — M9904 Segmental and somatic dysfunction of sacral region: Secondary | ICD-10-CM | POA: Diagnosis not present

## 2018-02-27 DIAGNOSIS — G544 Lumbosacral root disorders, not elsewhere classified: Secondary | ICD-10-CM | POA: Diagnosis not present

## 2018-02-28 DIAGNOSIS — G544 Lumbosacral root disorders, not elsewhere classified: Secondary | ICD-10-CM | POA: Diagnosis not present

## 2018-02-28 DIAGNOSIS — M9904 Segmental and somatic dysfunction of sacral region: Secondary | ICD-10-CM | POA: Diagnosis not present

## 2018-02-28 DIAGNOSIS — M9905 Segmental and somatic dysfunction of pelvic region: Secondary | ICD-10-CM | POA: Diagnosis not present

## 2018-02-28 DIAGNOSIS — M9903 Segmental and somatic dysfunction of lumbar region: Secondary | ICD-10-CM | POA: Diagnosis not present

## 2018-03-01 DIAGNOSIS — M9903 Segmental and somatic dysfunction of lumbar region: Secondary | ICD-10-CM | POA: Diagnosis not present

## 2018-03-01 DIAGNOSIS — M9902 Segmental and somatic dysfunction of thoracic region: Secondary | ICD-10-CM | POA: Diagnosis not present

## 2018-03-01 DIAGNOSIS — M9901 Segmental and somatic dysfunction of cervical region: Secondary | ICD-10-CM | POA: Diagnosis not present

## 2018-03-01 DIAGNOSIS — G542 Cervical root disorders, not elsewhere classified: Secondary | ICD-10-CM | POA: Diagnosis not present

## 2018-03-05 DIAGNOSIS — M9904 Segmental and somatic dysfunction of sacral region: Secondary | ICD-10-CM | POA: Diagnosis not present

## 2018-03-05 DIAGNOSIS — M9905 Segmental and somatic dysfunction of pelvic region: Secondary | ICD-10-CM | POA: Diagnosis not present

## 2018-03-05 DIAGNOSIS — M9903 Segmental and somatic dysfunction of lumbar region: Secondary | ICD-10-CM | POA: Diagnosis not present

## 2018-03-05 DIAGNOSIS — G544 Lumbosacral root disorders, not elsewhere classified: Secondary | ICD-10-CM | POA: Diagnosis not present

## 2018-03-06 DIAGNOSIS — M9903 Segmental and somatic dysfunction of lumbar region: Secondary | ICD-10-CM | POA: Diagnosis not present

## 2018-03-06 DIAGNOSIS — G544 Lumbosacral root disorders, not elsewhere classified: Secondary | ICD-10-CM | POA: Diagnosis not present

## 2018-03-06 DIAGNOSIS — M9904 Segmental and somatic dysfunction of sacral region: Secondary | ICD-10-CM | POA: Diagnosis not present

## 2018-03-06 DIAGNOSIS — M9905 Segmental and somatic dysfunction of pelvic region: Secondary | ICD-10-CM | POA: Diagnosis not present

## 2018-03-07 DIAGNOSIS — M9903 Segmental and somatic dysfunction of lumbar region: Secondary | ICD-10-CM | POA: Diagnosis not present

## 2018-03-07 DIAGNOSIS — M9902 Segmental and somatic dysfunction of thoracic region: Secondary | ICD-10-CM | POA: Diagnosis not present

## 2018-03-07 DIAGNOSIS — G542 Cervical root disorders, not elsewhere classified: Secondary | ICD-10-CM | POA: Diagnosis not present

## 2018-03-07 DIAGNOSIS — M9901 Segmental and somatic dysfunction of cervical region: Secondary | ICD-10-CM | POA: Diagnosis not present

## 2018-03-08 DIAGNOSIS — M9903 Segmental and somatic dysfunction of lumbar region: Secondary | ICD-10-CM | POA: Diagnosis not present

## 2018-03-08 DIAGNOSIS — G544 Lumbosacral root disorders, not elsewhere classified: Secondary | ICD-10-CM | POA: Diagnosis not present

## 2018-03-08 DIAGNOSIS — M9904 Segmental and somatic dysfunction of sacral region: Secondary | ICD-10-CM | POA: Diagnosis not present

## 2018-03-08 DIAGNOSIS — M9905 Segmental and somatic dysfunction of pelvic region: Secondary | ICD-10-CM | POA: Diagnosis not present

## 2018-03-12 DIAGNOSIS — M9903 Segmental and somatic dysfunction of lumbar region: Secondary | ICD-10-CM | POA: Diagnosis not present

## 2018-03-12 DIAGNOSIS — G542 Cervical root disorders, not elsewhere classified: Secondary | ICD-10-CM | POA: Diagnosis not present

## 2018-03-12 DIAGNOSIS — M9901 Segmental and somatic dysfunction of cervical region: Secondary | ICD-10-CM | POA: Diagnosis not present

## 2018-03-12 DIAGNOSIS — M9902 Segmental and somatic dysfunction of thoracic region: Secondary | ICD-10-CM | POA: Diagnosis not present

## 2018-03-13 DIAGNOSIS — G542 Cervical root disorders, not elsewhere classified: Secondary | ICD-10-CM | POA: Diagnosis not present

## 2018-03-13 DIAGNOSIS — M9902 Segmental and somatic dysfunction of thoracic region: Secondary | ICD-10-CM | POA: Diagnosis not present

## 2018-03-13 DIAGNOSIS — M9903 Segmental and somatic dysfunction of lumbar region: Secondary | ICD-10-CM | POA: Diagnosis not present

## 2018-03-13 DIAGNOSIS — M9901 Segmental and somatic dysfunction of cervical region: Secondary | ICD-10-CM | POA: Diagnosis not present

## 2018-03-14 DIAGNOSIS — M9904 Segmental and somatic dysfunction of sacral region: Secondary | ICD-10-CM | POA: Diagnosis not present

## 2018-03-14 DIAGNOSIS — G544 Lumbosacral root disorders, not elsewhere classified: Secondary | ICD-10-CM | POA: Diagnosis not present

## 2018-03-14 DIAGNOSIS — M9905 Segmental and somatic dysfunction of pelvic region: Secondary | ICD-10-CM | POA: Diagnosis not present

## 2018-03-14 DIAGNOSIS — M9903 Segmental and somatic dysfunction of lumbar region: Secondary | ICD-10-CM | POA: Diagnosis not present

## 2018-03-15 DIAGNOSIS — M9903 Segmental and somatic dysfunction of lumbar region: Secondary | ICD-10-CM | POA: Diagnosis not present

## 2018-03-15 DIAGNOSIS — G542 Cervical root disorders, not elsewhere classified: Secondary | ICD-10-CM | POA: Diagnosis not present

## 2018-03-15 DIAGNOSIS — M9902 Segmental and somatic dysfunction of thoracic region: Secondary | ICD-10-CM | POA: Diagnosis not present

## 2018-03-15 DIAGNOSIS — M9901 Segmental and somatic dysfunction of cervical region: Secondary | ICD-10-CM | POA: Diagnosis not present

## 2018-03-19 DIAGNOSIS — M9904 Segmental and somatic dysfunction of sacral region: Secondary | ICD-10-CM | POA: Diagnosis not present

## 2018-03-19 DIAGNOSIS — M9903 Segmental and somatic dysfunction of lumbar region: Secondary | ICD-10-CM | POA: Diagnosis not present

## 2018-03-19 DIAGNOSIS — G543 Thoracic root disorders, not elsewhere classified: Secondary | ICD-10-CM | POA: Diagnosis not present

## 2018-03-20 DIAGNOSIS — M9903 Segmental and somatic dysfunction of lumbar region: Secondary | ICD-10-CM | POA: Diagnosis not present

## 2018-03-20 DIAGNOSIS — M9902 Segmental and somatic dysfunction of thoracic region: Secondary | ICD-10-CM | POA: Diagnosis not present

## 2018-03-20 DIAGNOSIS — G542 Cervical root disorders, not elsewhere classified: Secondary | ICD-10-CM | POA: Diagnosis not present

## 2018-03-20 DIAGNOSIS — M9901 Segmental and somatic dysfunction of cervical region: Secondary | ICD-10-CM | POA: Diagnosis not present

## 2018-03-21 DIAGNOSIS — M9903 Segmental and somatic dysfunction of lumbar region: Secondary | ICD-10-CM | POA: Diagnosis not present

## 2018-03-21 DIAGNOSIS — M9904 Segmental and somatic dysfunction of sacral region: Secondary | ICD-10-CM | POA: Diagnosis not present

## 2018-03-21 DIAGNOSIS — G544 Lumbosacral root disorders, not elsewhere classified: Secondary | ICD-10-CM | POA: Diagnosis not present

## 2018-03-21 DIAGNOSIS — M9905 Segmental and somatic dysfunction of pelvic region: Secondary | ICD-10-CM | POA: Diagnosis not present

## 2018-03-22 DIAGNOSIS — M9902 Segmental and somatic dysfunction of thoracic region: Secondary | ICD-10-CM | POA: Diagnosis not present

## 2018-03-22 DIAGNOSIS — G542 Cervical root disorders, not elsewhere classified: Secondary | ICD-10-CM | POA: Diagnosis not present

## 2018-03-22 DIAGNOSIS — M9903 Segmental and somatic dysfunction of lumbar region: Secondary | ICD-10-CM | POA: Diagnosis not present

## 2018-03-22 DIAGNOSIS — M9901 Segmental and somatic dysfunction of cervical region: Secondary | ICD-10-CM | POA: Diagnosis not present

## 2018-03-26 DIAGNOSIS — M9903 Segmental and somatic dysfunction of lumbar region: Secondary | ICD-10-CM | POA: Diagnosis not present

## 2018-03-26 DIAGNOSIS — M9901 Segmental and somatic dysfunction of cervical region: Secondary | ICD-10-CM | POA: Diagnosis not present

## 2018-03-26 DIAGNOSIS — G542 Cervical root disorders, not elsewhere classified: Secondary | ICD-10-CM | POA: Diagnosis not present

## 2018-03-26 DIAGNOSIS — M9904 Segmental and somatic dysfunction of sacral region: Secondary | ICD-10-CM | POA: Diagnosis not present

## 2018-03-27 DIAGNOSIS — M9904 Segmental and somatic dysfunction of sacral region: Secondary | ICD-10-CM | POA: Diagnosis not present

## 2018-03-27 DIAGNOSIS — M9903 Segmental and somatic dysfunction of lumbar region: Secondary | ICD-10-CM | POA: Diagnosis not present

## 2018-03-27 DIAGNOSIS — M9905 Segmental and somatic dysfunction of pelvic region: Secondary | ICD-10-CM | POA: Diagnosis not present

## 2018-03-27 DIAGNOSIS — G544 Lumbosacral root disorders, not elsewhere classified: Secondary | ICD-10-CM | POA: Diagnosis not present

## 2018-03-28 DIAGNOSIS — M9905 Segmental and somatic dysfunction of pelvic region: Secondary | ICD-10-CM | POA: Diagnosis not present

## 2018-03-28 DIAGNOSIS — M9903 Segmental and somatic dysfunction of lumbar region: Secondary | ICD-10-CM | POA: Diagnosis not present

## 2018-03-28 DIAGNOSIS — G544 Lumbosacral root disorders, not elsewhere classified: Secondary | ICD-10-CM | POA: Diagnosis not present

## 2018-03-28 DIAGNOSIS — M9904 Segmental and somatic dysfunction of sacral region: Secondary | ICD-10-CM | POA: Diagnosis not present

## 2018-03-29 DIAGNOSIS — M9904 Segmental and somatic dysfunction of sacral region: Secondary | ICD-10-CM | POA: Diagnosis not present

## 2018-03-29 DIAGNOSIS — G544 Lumbosacral root disorders, not elsewhere classified: Secondary | ICD-10-CM | POA: Diagnosis not present

## 2018-03-29 DIAGNOSIS — M9903 Segmental and somatic dysfunction of lumbar region: Secondary | ICD-10-CM | POA: Diagnosis not present

## 2018-03-29 DIAGNOSIS — M9905 Segmental and somatic dysfunction of pelvic region: Secondary | ICD-10-CM | POA: Diagnosis not present

## 2018-04-02 DIAGNOSIS — M9903 Segmental and somatic dysfunction of lumbar region: Secondary | ICD-10-CM | POA: Diagnosis not present

## 2018-04-02 DIAGNOSIS — G542 Cervical root disorders, not elsewhere classified: Secondary | ICD-10-CM | POA: Diagnosis not present

## 2018-04-02 DIAGNOSIS — M9902 Segmental and somatic dysfunction of thoracic region: Secondary | ICD-10-CM | POA: Diagnosis not present

## 2018-04-02 DIAGNOSIS — M9901 Segmental and somatic dysfunction of cervical region: Secondary | ICD-10-CM | POA: Diagnosis not present

## 2018-04-03 DIAGNOSIS — M9905 Segmental and somatic dysfunction of pelvic region: Secondary | ICD-10-CM | POA: Diagnosis not present

## 2018-04-03 DIAGNOSIS — M9904 Segmental and somatic dysfunction of sacral region: Secondary | ICD-10-CM | POA: Diagnosis not present

## 2018-04-03 DIAGNOSIS — G544 Lumbosacral root disorders, not elsewhere classified: Secondary | ICD-10-CM | POA: Diagnosis not present

## 2018-04-03 DIAGNOSIS — M9903 Segmental and somatic dysfunction of lumbar region: Secondary | ICD-10-CM | POA: Diagnosis not present

## 2018-04-04 DIAGNOSIS — M9904 Segmental and somatic dysfunction of sacral region: Secondary | ICD-10-CM | POA: Diagnosis not present

## 2018-04-04 DIAGNOSIS — G542 Cervical root disorders, not elsewhere classified: Secondary | ICD-10-CM | POA: Diagnosis not present

## 2018-04-04 DIAGNOSIS — M9903 Segmental and somatic dysfunction of lumbar region: Secondary | ICD-10-CM | POA: Diagnosis not present

## 2018-04-04 DIAGNOSIS — M9901 Segmental and somatic dysfunction of cervical region: Secondary | ICD-10-CM | POA: Diagnosis not present

## 2018-04-05 DIAGNOSIS — M9903 Segmental and somatic dysfunction of lumbar region: Secondary | ICD-10-CM | POA: Diagnosis not present

## 2018-04-05 DIAGNOSIS — M9905 Segmental and somatic dysfunction of pelvic region: Secondary | ICD-10-CM | POA: Diagnosis not present

## 2018-04-05 DIAGNOSIS — M9904 Segmental and somatic dysfunction of sacral region: Secondary | ICD-10-CM | POA: Diagnosis not present

## 2018-04-05 DIAGNOSIS — G544 Lumbosacral root disorders, not elsewhere classified: Secondary | ICD-10-CM | POA: Diagnosis not present

## 2018-04-09 DIAGNOSIS — M9904 Segmental and somatic dysfunction of sacral region: Secondary | ICD-10-CM | POA: Diagnosis not present

## 2018-04-09 DIAGNOSIS — G544 Lumbosacral root disorders, not elsewhere classified: Secondary | ICD-10-CM | POA: Diagnosis not present

## 2018-04-09 DIAGNOSIS — M9905 Segmental and somatic dysfunction of pelvic region: Secondary | ICD-10-CM | POA: Diagnosis not present

## 2018-04-09 DIAGNOSIS — M9903 Segmental and somatic dysfunction of lumbar region: Secondary | ICD-10-CM | POA: Diagnosis not present

## 2018-04-10 DIAGNOSIS — M9904 Segmental and somatic dysfunction of sacral region: Secondary | ICD-10-CM | POA: Diagnosis not present

## 2018-04-10 DIAGNOSIS — G544 Lumbosacral root disorders, not elsewhere classified: Secondary | ICD-10-CM | POA: Diagnosis not present

## 2018-04-10 DIAGNOSIS — M9905 Segmental and somatic dysfunction of pelvic region: Secondary | ICD-10-CM | POA: Diagnosis not present

## 2018-04-10 DIAGNOSIS — M9903 Segmental and somatic dysfunction of lumbar region: Secondary | ICD-10-CM | POA: Diagnosis not present

## 2018-04-11 DIAGNOSIS — M9903 Segmental and somatic dysfunction of lumbar region: Secondary | ICD-10-CM | POA: Diagnosis not present

## 2018-04-11 DIAGNOSIS — M9904 Segmental and somatic dysfunction of sacral region: Secondary | ICD-10-CM | POA: Diagnosis not present

## 2018-04-11 DIAGNOSIS — G542 Cervical root disorders, not elsewhere classified: Secondary | ICD-10-CM | POA: Diagnosis not present

## 2018-04-11 DIAGNOSIS — M9901 Segmental and somatic dysfunction of cervical region: Secondary | ICD-10-CM | POA: Diagnosis not present

## 2018-04-12 DIAGNOSIS — M9901 Segmental and somatic dysfunction of cervical region: Secondary | ICD-10-CM | POA: Diagnosis not present

## 2018-04-12 DIAGNOSIS — M9903 Segmental and somatic dysfunction of lumbar region: Secondary | ICD-10-CM | POA: Diagnosis not present

## 2018-04-12 DIAGNOSIS — M9902 Segmental and somatic dysfunction of thoracic region: Secondary | ICD-10-CM | POA: Diagnosis not present

## 2018-04-12 DIAGNOSIS — G542 Cervical root disorders, not elsewhere classified: Secondary | ICD-10-CM | POA: Diagnosis not present

## 2018-04-16 DIAGNOSIS — G542 Cervical root disorders, not elsewhere classified: Secondary | ICD-10-CM | POA: Diagnosis not present

## 2018-04-16 DIAGNOSIS — M9902 Segmental and somatic dysfunction of thoracic region: Secondary | ICD-10-CM | POA: Diagnosis not present

## 2018-04-16 DIAGNOSIS — M9903 Segmental and somatic dysfunction of lumbar region: Secondary | ICD-10-CM | POA: Diagnosis not present

## 2018-04-16 DIAGNOSIS — M9901 Segmental and somatic dysfunction of cervical region: Secondary | ICD-10-CM | POA: Diagnosis not present

## 2018-04-17 DIAGNOSIS — M9902 Segmental and somatic dysfunction of thoracic region: Secondary | ICD-10-CM | POA: Diagnosis not present

## 2018-04-17 DIAGNOSIS — M9903 Segmental and somatic dysfunction of lumbar region: Secondary | ICD-10-CM | POA: Diagnosis not present

## 2018-04-17 DIAGNOSIS — G542 Cervical root disorders, not elsewhere classified: Secondary | ICD-10-CM | POA: Diagnosis not present

## 2018-04-17 DIAGNOSIS — M9901 Segmental and somatic dysfunction of cervical region: Secondary | ICD-10-CM | POA: Diagnosis not present

## 2018-04-18 DIAGNOSIS — M9904 Segmental and somatic dysfunction of sacral region: Secondary | ICD-10-CM | POA: Diagnosis not present

## 2018-04-18 DIAGNOSIS — M9901 Segmental and somatic dysfunction of cervical region: Secondary | ICD-10-CM | POA: Diagnosis not present

## 2018-04-18 DIAGNOSIS — M9903 Segmental and somatic dysfunction of lumbar region: Secondary | ICD-10-CM | POA: Diagnosis not present

## 2018-04-18 DIAGNOSIS — G542 Cervical root disorders, not elsewhere classified: Secondary | ICD-10-CM | POA: Diagnosis not present

## 2018-04-19 DIAGNOSIS — M9903 Segmental and somatic dysfunction of lumbar region: Secondary | ICD-10-CM | POA: Diagnosis not present

## 2018-04-19 DIAGNOSIS — G542 Cervical root disorders, not elsewhere classified: Secondary | ICD-10-CM | POA: Diagnosis not present

## 2018-04-19 DIAGNOSIS — M9904 Segmental and somatic dysfunction of sacral region: Secondary | ICD-10-CM | POA: Diagnosis not present

## 2018-04-19 DIAGNOSIS — M9901 Segmental and somatic dysfunction of cervical region: Secondary | ICD-10-CM | POA: Diagnosis not present

## 2018-04-23 DIAGNOSIS — M9903 Segmental and somatic dysfunction of lumbar region: Secondary | ICD-10-CM | POA: Diagnosis not present

## 2018-04-23 DIAGNOSIS — M9904 Segmental and somatic dysfunction of sacral region: Secondary | ICD-10-CM | POA: Diagnosis not present

## 2018-04-23 DIAGNOSIS — G544 Lumbosacral root disorders, not elsewhere classified: Secondary | ICD-10-CM | POA: Diagnosis not present

## 2018-04-23 DIAGNOSIS — M9905 Segmental and somatic dysfunction of pelvic region: Secondary | ICD-10-CM | POA: Diagnosis not present

## 2018-04-24 DIAGNOSIS — M9903 Segmental and somatic dysfunction of lumbar region: Secondary | ICD-10-CM | POA: Diagnosis not present

## 2018-04-24 DIAGNOSIS — M9904 Segmental and somatic dysfunction of sacral region: Secondary | ICD-10-CM | POA: Diagnosis not present

## 2018-04-24 DIAGNOSIS — M9905 Segmental and somatic dysfunction of pelvic region: Secondary | ICD-10-CM | POA: Diagnosis not present

## 2018-04-24 DIAGNOSIS — G544 Lumbosacral root disorders, not elsewhere classified: Secondary | ICD-10-CM | POA: Diagnosis not present

## 2018-04-25 DIAGNOSIS — G544 Lumbosacral root disorders, not elsewhere classified: Secondary | ICD-10-CM | POA: Diagnosis not present

## 2018-04-25 DIAGNOSIS — M9905 Segmental and somatic dysfunction of pelvic region: Secondary | ICD-10-CM | POA: Diagnosis not present

## 2018-04-25 DIAGNOSIS — M9903 Segmental and somatic dysfunction of lumbar region: Secondary | ICD-10-CM | POA: Diagnosis not present

## 2018-04-25 DIAGNOSIS — M9904 Segmental and somatic dysfunction of sacral region: Secondary | ICD-10-CM | POA: Diagnosis not present

## 2018-04-26 DIAGNOSIS — M9904 Segmental and somatic dysfunction of sacral region: Secondary | ICD-10-CM | POA: Diagnosis not present

## 2018-04-26 DIAGNOSIS — G544 Lumbosacral root disorders, not elsewhere classified: Secondary | ICD-10-CM | POA: Diagnosis not present

## 2018-04-26 DIAGNOSIS — M9903 Segmental and somatic dysfunction of lumbar region: Secondary | ICD-10-CM | POA: Diagnosis not present

## 2018-04-26 DIAGNOSIS — M9905 Segmental and somatic dysfunction of pelvic region: Secondary | ICD-10-CM | POA: Diagnosis not present

## 2018-04-30 DIAGNOSIS — M9903 Segmental and somatic dysfunction of lumbar region: Secondary | ICD-10-CM | POA: Diagnosis not present

## 2018-04-30 DIAGNOSIS — M9902 Segmental and somatic dysfunction of thoracic region: Secondary | ICD-10-CM | POA: Diagnosis not present

## 2018-04-30 DIAGNOSIS — M9901 Segmental and somatic dysfunction of cervical region: Secondary | ICD-10-CM | POA: Diagnosis not present

## 2018-04-30 DIAGNOSIS — G542 Cervical root disorders, not elsewhere classified: Secondary | ICD-10-CM | POA: Diagnosis not present

## 2018-05-01 DIAGNOSIS — M9903 Segmental and somatic dysfunction of lumbar region: Secondary | ICD-10-CM | POA: Diagnosis not present

## 2018-05-01 DIAGNOSIS — M9904 Segmental and somatic dysfunction of sacral region: Secondary | ICD-10-CM | POA: Diagnosis not present

## 2018-05-01 DIAGNOSIS — M9901 Segmental and somatic dysfunction of cervical region: Secondary | ICD-10-CM | POA: Diagnosis not present

## 2018-05-01 DIAGNOSIS — M9902 Segmental and somatic dysfunction of thoracic region: Secondary | ICD-10-CM | POA: Diagnosis not present

## 2018-05-01 DIAGNOSIS — M9900 Segmental and somatic dysfunction of head region: Secondary | ICD-10-CM | POA: Diagnosis not present

## 2018-05-02 DIAGNOSIS — M9902 Segmental and somatic dysfunction of thoracic region: Secondary | ICD-10-CM | POA: Diagnosis not present

## 2018-05-02 DIAGNOSIS — M9904 Segmental and somatic dysfunction of sacral region: Secondary | ICD-10-CM | POA: Diagnosis not present

## 2018-05-02 DIAGNOSIS — M9903 Segmental and somatic dysfunction of lumbar region: Secondary | ICD-10-CM | POA: Diagnosis not present

## 2018-05-02 DIAGNOSIS — M9901 Segmental and somatic dysfunction of cervical region: Secondary | ICD-10-CM | POA: Diagnosis not present

## 2018-05-02 DIAGNOSIS — M9900 Segmental and somatic dysfunction of head region: Secondary | ICD-10-CM | POA: Diagnosis not present

## 2018-05-07 DIAGNOSIS — M9903 Segmental and somatic dysfunction of lumbar region: Secondary | ICD-10-CM | POA: Diagnosis not present

## 2018-05-07 DIAGNOSIS — M9905 Segmental and somatic dysfunction of pelvic region: Secondary | ICD-10-CM | POA: Diagnosis not present

## 2018-05-07 DIAGNOSIS — G544 Lumbosacral root disorders, not elsewhere classified: Secondary | ICD-10-CM | POA: Diagnosis not present

## 2018-05-07 DIAGNOSIS — M9904 Segmental and somatic dysfunction of sacral region: Secondary | ICD-10-CM | POA: Diagnosis not present

## 2018-05-08 DIAGNOSIS — M9905 Segmental and somatic dysfunction of pelvic region: Secondary | ICD-10-CM | POA: Diagnosis not present

## 2018-05-08 DIAGNOSIS — M9903 Segmental and somatic dysfunction of lumbar region: Secondary | ICD-10-CM | POA: Diagnosis not present

## 2018-05-08 DIAGNOSIS — G544 Lumbosacral root disorders, not elsewhere classified: Secondary | ICD-10-CM | POA: Diagnosis not present

## 2018-05-08 DIAGNOSIS — M9904 Segmental and somatic dysfunction of sacral region: Secondary | ICD-10-CM | POA: Diagnosis not present

## 2018-05-09 DIAGNOSIS — M9900 Segmental and somatic dysfunction of head region: Secondary | ICD-10-CM | POA: Diagnosis not present

## 2018-05-09 DIAGNOSIS — M9902 Segmental and somatic dysfunction of thoracic region: Secondary | ICD-10-CM | POA: Diagnosis not present

## 2018-05-09 DIAGNOSIS — M9904 Segmental and somatic dysfunction of sacral region: Secondary | ICD-10-CM | POA: Diagnosis not present

## 2018-05-09 DIAGNOSIS — M9901 Segmental and somatic dysfunction of cervical region: Secondary | ICD-10-CM | POA: Diagnosis not present

## 2018-05-09 DIAGNOSIS — M9903 Segmental and somatic dysfunction of lumbar region: Secondary | ICD-10-CM | POA: Diagnosis not present

## 2018-05-12 ENCOUNTER — Other Ambulatory Visit: Payer: Self-pay | Admitting: Family Medicine

## 2018-05-14 DIAGNOSIS — G544 Lumbosacral root disorders, not elsewhere classified: Secondary | ICD-10-CM | POA: Diagnosis not present

## 2018-05-14 DIAGNOSIS — M9905 Segmental and somatic dysfunction of pelvic region: Secondary | ICD-10-CM | POA: Diagnosis not present

## 2018-05-14 DIAGNOSIS — M9903 Segmental and somatic dysfunction of lumbar region: Secondary | ICD-10-CM | POA: Diagnosis not present

## 2018-05-14 DIAGNOSIS — M9904 Segmental and somatic dysfunction of sacral region: Secondary | ICD-10-CM | POA: Diagnosis not present

## 2018-05-15 DIAGNOSIS — M9901 Segmental and somatic dysfunction of cervical region: Secondary | ICD-10-CM | POA: Diagnosis not present

## 2018-05-15 DIAGNOSIS — M9902 Segmental and somatic dysfunction of thoracic region: Secondary | ICD-10-CM | POA: Diagnosis not present

## 2018-05-15 DIAGNOSIS — G542 Cervical root disorders, not elsewhere classified: Secondary | ICD-10-CM | POA: Diagnosis not present

## 2018-05-15 DIAGNOSIS — M9903 Segmental and somatic dysfunction of lumbar region: Secondary | ICD-10-CM | POA: Diagnosis not present

## 2018-05-16 DIAGNOSIS — M9902 Segmental and somatic dysfunction of thoracic region: Secondary | ICD-10-CM | POA: Diagnosis not present

## 2018-05-16 DIAGNOSIS — G542 Cervical root disorders, not elsewhere classified: Secondary | ICD-10-CM | POA: Diagnosis not present

## 2018-05-16 DIAGNOSIS — M9901 Segmental and somatic dysfunction of cervical region: Secondary | ICD-10-CM | POA: Diagnosis not present

## 2018-05-16 DIAGNOSIS — M9903 Segmental and somatic dysfunction of lumbar region: Secondary | ICD-10-CM | POA: Diagnosis not present

## 2018-05-17 DIAGNOSIS — M9902 Segmental and somatic dysfunction of thoracic region: Secondary | ICD-10-CM | POA: Diagnosis not present

## 2018-05-17 DIAGNOSIS — M9904 Segmental and somatic dysfunction of sacral region: Secondary | ICD-10-CM | POA: Diagnosis not present

## 2018-05-17 DIAGNOSIS — M9903 Segmental and somatic dysfunction of lumbar region: Secondary | ICD-10-CM | POA: Diagnosis not present

## 2018-05-17 DIAGNOSIS — M9900 Segmental and somatic dysfunction of head region: Secondary | ICD-10-CM | POA: Diagnosis not present

## 2018-05-17 DIAGNOSIS — M9901 Segmental and somatic dysfunction of cervical region: Secondary | ICD-10-CM | POA: Diagnosis not present

## 2018-05-21 ENCOUNTER — Encounter: Payer: Self-pay | Admitting: Family Medicine

## 2018-05-21 ENCOUNTER — Ambulatory Visit (INDEPENDENT_AMBULATORY_CARE_PROVIDER_SITE_OTHER): Payer: Medicare Other | Admitting: Family Medicine

## 2018-05-21 ENCOUNTER — Other Ambulatory Visit: Payer: Self-pay

## 2018-05-21 ENCOUNTER — Ambulatory Visit: Payer: Medicare Other | Admitting: *Deleted

## 2018-05-21 DIAGNOSIS — M9903 Segmental and somatic dysfunction of lumbar region: Secondary | ICD-10-CM | POA: Diagnosis not present

## 2018-05-21 DIAGNOSIS — G544 Lumbosacral root disorders, not elsewhere classified: Secondary | ICD-10-CM | POA: Diagnosis not present

## 2018-05-21 DIAGNOSIS — I10 Essential (primary) hypertension: Secondary | ICD-10-CM

## 2018-05-21 DIAGNOSIS — M9904 Segmental and somatic dysfunction of sacral region: Secondary | ICD-10-CM | POA: Diagnosis not present

## 2018-05-21 DIAGNOSIS — E785 Hyperlipidemia, unspecified: Secondary | ICD-10-CM | POA: Diagnosis not present

## 2018-05-21 DIAGNOSIS — M9905 Segmental and somatic dysfunction of pelvic region: Secondary | ICD-10-CM | POA: Diagnosis not present

## 2018-05-21 DIAGNOSIS — E119 Type 2 diabetes mellitus without complications: Secondary | ICD-10-CM

## 2018-05-21 MED ORDER — LOSARTAN POTASSIUM 100 MG PO TABS: 100.0000 mg | ORAL_TABLET | Freq: Every day | ORAL | 2 refills | Status: DC

## 2018-05-21 MED ORDER — HYDROCHLOROTHIAZIDE 25 MG PO TABS: 25.0000 mg | ORAL_TABLET | Freq: Every day | ORAL | 2 refills | Status: DC

## 2018-05-21 MED ORDER — ATORVASTATIN CALCIUM 20 MG PO TABS: 20.0000 mg | ORAL_TABLET | Freq: Every day | ORAL | 3 refills | Status: DC

## 2018-05-21 NOTE — Progress Notes (Addendum)
Virtual Visit via Video Note  I connected with patient on 05/17/18 at  1:15 PM EDT by a video enabled telemedicine application and verified that I am speaking with the correct person using two identifiers.   I discussed the limitations of evaluation and management by telemedicine and the availability of in person appointments. The patient expressed understanding and agreed to proceed.  History of Present Illness: Hypertension Patient presents for hypertension follow up. He does not routinely monitor home blood pressures. He is compliant with medications- HCTZ 25 mg/d, losartan 100 mg/d. Patient has these side effects of medication: none He is adhering to a healthy diet overall. Exercise: walking every other day  DM II Diet controlled, last A1c 6.3. Diet and exercise as above. On ARB. Declines statin.  Completed PCV series. Does not ck sugars.  Last eye exam >3 years ago.   Hyperlipidemia Patient presents for dyslipidemia follow up. Currently being treated with Lipitor 20 mg/d and compliance with treatment thus far has been good. He denies myalgias. He is adhering to a healthy. Exercise: walking The patient is not known to have coexisting coronary artery disease.    Observations/Objective: No conversational dyspnea Age appropriate judgment and insight Nml affect and mood  Assessment and Plan: Essential hypertension, benign - Plan: hydrochlorothiazide (HYDRODIURIL) 25 MG tablet, losartan (COZAAR) 100 MG tablet  Hyperlipidemia, unspecified hyperlipidemia type - Plan: atorvastatin (LIPITOR) 20 MG tablet, Lipid panel  Type 2 diabetes mellitus without complication, without long-term current use of insulin (HCC) - Plan: atorvastatin (LIPITOR) 20 MG tablet, Ambulatory referral to Ophthalmology, Hemoglobin A1c  Orders as above  Follow Up Instructions: 6 mo. Labs at earliest convenience.    I discussed the assessment and treatment plan with the patient. The patient was  provided an opportunity to ask questions and all were answered. The patient agreed with the plan and demonstrated an understanding of the instructions.   The patient was advised to call back or seek an in-person evaluation if the symptoms worsen or if the condition fails to improve as anticipated.   Jilda Roche [place], DO

## 2018-05-22 ENCOUNTER — Other Ambulatory Visit (INDEPENDENT_AMBULATORY_CARE_PROVIDER_SITE_OTHER): Payer: Medicare Other

## 2018-05-22 ENCOUNTER — Other Ambulatory Visit: Payer: Self-pay | Admitting: Family Medicine

## 2018-05-22 ENCOUNTER — Other Ambulatory Visit: Payer: Self-pay

## 2018-05-22 DIAGNOSIS — M9903 Segmental and somatic dysfunction of lumbar region: Secondary | ICD-10-CM | POA: Diagnosis not present

## 2018-05-22 DIAGNOSIS — E119 Type 2 diabetes mellitus without complications: Secondary | ICD-10-CM | POA: Diagnosis not present

## 2018-05-22 DIAGNOSIS — M9905 Segmental and somatic dysfunction of pelvic region: Secondary | ICD-10-CM | POA: Diagnosis not present

## 2018-05-22 DIAGNOSIS — Z9119 Patient's noncompliance with other medical treatment and regimen: Secondary | ICD-10-CM

## 2018-05-22 DIAGNOSIS — G544 Lumbosacral root disorders, not elsewhere classified: Secondary | ICD-10-CM | POA: Diagnosis not present

## 2018-05-22 DIAGNOSIS — E785 Hyperlipidemia, unspecified: Secondary | ICD-10-CM

## 2018-05-22 DIAGNOSIS — Z91199 Patient's noncompliance with other medical treatment and regimen due to unspecified reason: Secondary | ICD-10-CM | POA: Insufficient documentation

## 2018-05-22 DIAGNOSIS — M9904 Segmental and somatic dysfunction of sacral region: Secondary | ICD-10-CM | POA: Diagnosis not present

## 2018-05-22 LAB — LIPID PANEL
Cholesterol: 246 mg/dL — ABNORMAL HIGH (ref 0–200)
HDL: 53.4 mg/dL (ref >39.00)
LDL Cholesterol: 167 mg/dL — ABNORMAL HIGH (ref 0–99)
NonHDL: 192.33
Total CHOL/HDL Ratio: 5
Triglycerides: 125 mg/dL (ref 0.0–149.0)
VLDL: 25 mg/dL (ref 0.0–40.0)

## 2018-05-22 LAB — HEMOGLOBIN A1C: Hgb A1c MFr Bld: 7.6 % — ABNORMAL HIGH (ref 4.6–6.5)

## 2018-05-23 DIAGNOSIS — G544 Lumbosacral root disorders, not elsewhere classified: Secondary | ICD-10-CM | POA: Diagnosis not present

## 2018-05-23 DIAGNOSIS — M9903 Segmental and somatic dysfunction of lumbar region: Secondary | ICD-10-CM | POA: Diagnosis not present

## 2018-05-23 DIAGNOSIS — M9904 Segmental and somatic dysfunction of sacral region: Secondary | ICD-10-CM | POA: Diagnosis not present

## 2018-05-23 DIAGNOSIS — M9905 Segmental and somatic dysfunction of pelvic region: Secondary | ICD-10-CM | POA: Diagnosis not present

## 2018-05-28 DIAGNOSIS — M9904 Segmental and somatic dysfunction of sacral region: Secondary | ICD-10-CM | POA: Diagnosis not present

## 2018-05-28 DIAGNOSIS — M9903 Segmental and somatic dysfunction of lumbar region: Secondary | ICD-10-CM | POA: Diagnosis not present

## 2018-05-28 DIAGNOSIS — M9901 Segmental and somatic dysfunction of cervical region: Secondary | ICD-10-CM | POA: Diagnosis not present

## 2018-05-28 DIAGNOSIS — G542 Cervical root disorders, not elsewhere classified: Secondary | ICD-10-CM | POA: Diagnosis not present

## 2018-05-29 DIAGNOSIS — G542 Cervical root disorders, not elsewhere classified: Secondary | ICD-10-CM | POA: Diagnosis not present

## 2018-05-29 DIAGNOSIS — M9902 Segmental and somatic dysfunction of thoracic region: Secondary | ICD-10-CM | POA: Diagnosis not present

## 2018-05-29 DIAGNOSIS — M9903 Segmental and somatic dysfunction of lumbar region: Secondary | ICD-10-CM | POA: Diagnosis not present

## 2018-05-29 DIAGNOSIS — M9901 Segmental and somatic dysfunction of cervical region: Secondary | ICD-10-CM | POA: Diagnosis not present

## 2018-05-30 DIAGNOSIS — M9903 Segmental and somatic dysfunction of lumbar region: Secondary | ICD-10-CM | POA: Diagnosis not present

## 2018-05-30 DIAGNOSIS — M9905 Segmental and somatic dysfunction of pelvic region: Secondary | ICD-10-CM | POA: Diagnosis not present

## 2018-05-30 DIAGNOSIS — M9904 Segmental and somatic dysfunction of sacral region: Secondary | ICD-10-CM | POA: Diagnosis not present

## 2018-05-30 DIAGNOSIS — G544 Lumbosacral root disorders, not elsewhere classified: Secondary | ICD-10-CM | POA: Diagnosis not present

## 2018-05-31 DIAGNOSIS — M9904 Segmental and somatic dysfunction of sacral region: Secondary | ICD-10-CM | POA: Diagnosis not present

## 2018-05-31 DIAGNOSIS — M9903 Segmental and somatic dysfunction of lumbar region: Secondary | ICD-10-CM | POA: Diagnosis not present

## 2018-05-31 DIAGNOSIS — G544 Lumbosacral root disorders, not elsewhere classified: Secondary | ICD-10-CM | POA: Diagnosis not present

## 2018-05-31 DIAGNOSIS — M9905 Segmental and somatic dysfunction of pelvic region: Secondary | ICD-10-CM | POA: Diagnosis not present

## 2018-06-04 DIAGNOSIS — M9905 Segmental and somatic dysfunction of pelvic region: Secondary | ICD-10-CM | POA: Diagnosis not present

## 2018-06-04 DIAGNOSIS — M9904 Segmental and somatic dysfunction of sacral region: Secondary | ICD-10-CM | POA: Diagnosis not present

## 2018-06-04 DIAGNOSIS — M9903 Segmental and somatic dysfunction of lumbar region: Secondary | ICD-10-CM | POA: Diagnosis not present

## 2018-06-04 DIAGNOSIS — G544 Lumbosacral root disorders, not elsewhere classified: Secondary | ICD-10-CM | POA: Diagnosis not present

## 2018-06-05 DIAGNOSIS — M9904 Segmental and somatic dysfunction of sacral region: Secondary | ICD-10-CM | POA: Diagnosis not present

## 2018-06-05 DIAGNOSIS — M9903 Segmental and somatic dysfunction of lumbar region: Secondary | ICD-10-CM | POA: Diagnosis not present

## 2018-06-05 DIAGNOSIS — G544 Lumbosacral root disorders, not elsewhere classified: Secondary | ICD-10-CM | POA: Diagnosis not present

## 2018-06-05 DIAGNOSIS — M9905 Segmental and somatic dysfunction of pelvic region: Secondary | ICD-10-CM | POA: Diagnosis not present

## 2018-06-06 DIAGNOSIS — M9904 Segmental and somatic dysfunction of sacral region: Secondary | ICD-10-CM | POA: Diagnosis not present

## 2018-06-06 DIAGNOSIS — M9905 Segmental and somatic dysfunction of pelvic region: Secondary | ICD-10-CM | POA: Diagnosis not present

## 2018-06-06 DIAGNOSIS — M9903 Segmental and somatic dysfunction of lumbar region: Secondary | ICD-10-CM | POA: Diagnosis not present

## 2018-06-06 DIAGNOSIS — G544 Lumbosacral root disorders, not elsewhere classified: Secondary | ICD-10-CM | POA: Diagnosis not present

## 2018-06-07 DIAGNOSIS — G544 Lumbosacral root disorders, not elsewhere classified: Secondary | ICD-10-CM | POA: Diagnosis not present

## 2018-06-07 DIAGNOSIS — M9905 Segmental and somatic dysfunction of pelvic region: Secondary | ICD-10-CM | POA: Diagnosis not present

## 2018-06-07 DIAGNOSIS — M9904 Segmental and somatic dysfunction of sacral region: Secondary | ICD-10-CM | POA: Diagnosis not present

## 2018-06-07 DIAGNOSIS — M9903 Segmental and somatic dysfunction of lumbar region: Secondary | ICD-10-CM | POA: Diagnosis not present

## 2018-06-11 DIAGNOSIS — M9903 Segmental and somatic dysfunction of lumbar region: Secondary | ICD-10-CM | POA: Diagnosis not present

## 2018-06-11 DIAGNOSIS — G544 Lumbosacral root disorders, not elsewhere classified: Secondary | ICD-10-CM | POA: Diagnosis not present

## 2018-06-11 DIAGNOSIS — M9905 Segmental and somatic dysfunction of pelvic region: Secondary | ICD-10-CM | POA: Diagnosis not present

## 2018-06-11 DIAGNOSIS — M9904 Segmental and somatic dysfunction of sacral region: Secondary | ICD-10-CM | POA: Diagnosis not present

## 2018-06-12 DIAGNOSIS — G544 Lumbosacral root disorders, not elsewhere classified: Secondary | ICD-10-CM | POA: Diagnosis not present

## 2018-06-12 DIAGNOSIS — M9903 Segmental and somatic dysfunction of lumbar region: Secondary | ICD-10-CM | POA: Diagnosis not present

## 2018-06-12 DIAGNOSIS — M9904 Segmental and somatic dysfunction of sacral region: Secondary | ICD-10-CM | POA: Diagnosis not present

## 2018-06-12 DIAGNOSIS — M9905 Segmental and somatic dysfunction of pelvic region: Secondary | ICD-10-CM | POA: Diagnosis not present

## 2018-06-13 DIAGNOSIS — M9903 Segmental and somatic dysfunction of lumbar region: Secondary | ICD-10-CM | POA: Diagnosis not present

## 2018-06-13 DIAGNOSIS — M9905 Segmental and somatic dysfunction of pelvic region: Secondary | ICD-10-CM | POA: Diagnosis not present

## 2018-06-13 DIAGNOSIS — G544 Lumbosacral root disorders, not elsewhere classified: Secondary | ICD-10-CM | POA: Diagnosis not present

## 2018-06-13 DIAGNOSIS — M9904 Segmental and somatic dysfunction of sacral region: Secondary | ICD-10-CM | POA: Diagnosis not present

## 2018-06-14 DIAGNOSIS — M9905 Segmental and somatic dysfunction of pelvic region: Secondary | ICD-10-CM | POA: Diagnosis not present

## 2018-06-14 DIAGNOSIS — G544 Lumbosacral root disorders, not elsewhere classified: Secondary | ICD-10-CM | POA: Diagnosis not present

## 2018-06-14 DIAGNOSIS — M9903 Segmental and somatic dysfunction of lumbar region: Secondary | ICD-10-CM | POA: Diagnosis not present

## 2018-06-14 DIAGNOSIS — M9904 Segmental and somatic dysfunction of sacral region: Secondary | ICD-10-CM | POA: Diagnosis not present

## 2018-06-18 DIAGNOSIS — M9904 Segmental and somatic dysfunction of sacral region: Secondary | ICD-10-CM | POA: Diagnosis not present

## 2018-06-18 DIAGNOSIS — M9903 Segmental and somatic dysfunction of lumbar region: Secondary | ICD-10-CM | POA: Diagnosis not present

## 2018-06-18 DIAGNOSIS — G544 Lumbosacral root disorders, not elsewhere classified: Secondary | ICD-10-CM | POA: Diagnosis not present

## 2018-06-18 DIAGNOSIS — M9905 Segmental and somatic dysfunction of pelvic region: Secondary | ICD-10-CM | POA: Diagnosis not present

## 2018-06-19 DIAGNOSIS — M9902 Segmental and somatic dysfunction of thoracic region: Secondary | ICD-10-CM | POA: Diagnosis not present

## 2018-06-19 DIAGNOSIS — M9901 Segmental and somatic dysfunction of cervical region: Secondary | ICD-10-CM | POA: Diagnosis not present

## 2018-06-19 DIAGNOSIS — G542 Cervical root disorders, not elsewhere classified: Secondary | ICD-10-CM | POA: Diagnosis not present

## 2018-06-19 DIAGNOSIS — M9903 Segmental and somatic dysfunction of lumbar region: Secondary | ICD-10-CM | POA: Diagnosis not present

## 2018-06-20 DIAGNOSIS — M9903 Segmental and somatic dysfunction of lumbar region: Secondary | ICD-10-CM | POA: Diagnosis not present

## 2018-06-20 DIAGNOSIS — M9901 Segmental and somatic dysfunction of cervical region: Secondary | ICD-10-CM | POA: Diagnosis not present

## 2018-06-20 DIAGNOSIS — M9902 Segmental and somatic dysfunction of thoracic region: Secondary | ICD-10-CM | POA: Diagnosis not present

## 2018-06-20 DIAGNOSIS — G542 Cervical root disorders, not elsewhere classified: Secondary | ICD-10-CM | POA: Diagnosis not present

## 2018-06-21 DIAGNOSIS — M9902 Segmental and somatic dysfunction of thoracic region: Secondary | ICD-10-CM | POA: Diagnosis not present

## 2018-06-21 DIAGNOSIS — G542 Cervical root disorders, not elsewhere classified: Secondary | ICD-10-CM | POA: Diagnosis not present

## 2018-06-21 DIAGNOSIS — M9903 Segmental and somatic dysfunction of lumbar region: Secondary | ICD-10-CM | POA: Diagnosis not present

## 2018-06-21 DIAGNOSIS — M9901 Segmental and somatic dysfunction of cervical region: Secondary | ICD-10-CM | POA: Diagnosis not present

## 2018-06-25 DIAGNOSIS — M9905 Segmental and somatic dysfunction of pelvic region: Secondary | ICD-10-CM | POA: Diagnosis not present

## 2018-06-25 DIAGNOSIS — M9903 Segmental and somatic dysfunction of lumbar region: Secondary | ICD-10-CM | POA: Diagnosis not present

## 2018-06-25 DIAGNOSIS — M9904 Segmental and somatic dysfunction of sacral region: Secondary | ICD-10-CM | POA: Diagnosis not present

## 2018-06-25 DIAGNOSIS — G544 Lumbosacral root disorders, not elsewhere classified: Secondary | ICD-10-CM | POA: Diagnosis not present

## 2018-06-26 DIAGNOSIS — G544 Lumbosacral root disorders, not elsewhere classified: Secondary | ICD-10-CM | POA: Diagnosis not present

## 2018-06-26 DIAGNOSIS — M9905 Segmental and somatic dysfunction of pelvic region: Secondary | ICD-10-CM | POA: Diagnosis not present

## 2018-06-26 DIAGNOSIS — M9904 Segmental and somatic dysfunction of sacral region: Secondary | ICD-10-CM | POA: Diagnosis not present

## 2018-06-26 DIAGNOSIS — M9903 Segmental and somatic dysfunction of lumbar region: Secondary | ICD-10-CM | POA: Diagnosis not present

## 2018-06-27 DIAGNOSIS — G544 Lumbosacral root disorders, not elsewhere classified: Secondary | ICD-10-CM | POA: Diagnosis not present

## 2018-06-27 DIAGNOSIS — M9903 Segmental and somatic dysfunction of lumbar region: Secondary | ICD-10-CM | POA: Diagnosis not present

## 2018-06-27 DIAGNOSIS — M9905 Segmental and somatic dysfunction of pelvic region: Secondary | ICD-10-CM | POA: Diagnosis not present

## 2018-06-27 DIAGNOSIS — M9904 Segmental and somatic dysfunction of sacral region: Secondary | ICD-10-CM | POA: Diagnosis not present

## 2018-06-27 NOTE — Progress Notes (Signed)
Virtual Visit via Video Note  I connected with patient on 06/28/18 at  8:00 AM EDT by a video enabled telemedicine application and verified that I am speaking with the correct person using two identifiers.   THIS ENCOUNTER IS A VIRTUAL VISIT DUE TO COVID-19 - PATIENT WAS NOT SEEN IN THE OFFICE. PATIENT HAS CONSENTED TO VIRTUAL VISIT / TELEMEDICINE VISIT   Location of patient: home  Location of provider: office  I discussed the limitations of evaluation and management by telemedicine and the availability of in person appointments. The patient expressed understanding and agreed to proceed.   Subjective:   Maxwell Hanson is a 68 y.o. male who presents for Medicare Annual (Subsequent) preventive examination.  Review of Systems: No ROS.  Medicare Wellness Virtual Visit.  Visual/audio telehealth visit, UTA vital signs.   See social history for additional risk factors. Cardiac Risk Factors include: advanced age (>90men, >64 women);diabetes mellitus;dyslipidemia;hypertension;male gender Sleep patterns: no issues Home Safety/Smoke Alarms: Feels safe in home. Smoke alarms in place.  Lives in 1 story home with wife and daughter.  Male:   CCS- Cologuard 11/10/17.   Negative PSA-  Lab Results  Component Value Date   PSA 0.52 01/06/2015   PSA 0.48 12/18/2013       Objective:     Vitals: BP 130/70 Comment: pt reports  Pulse (!) 57 Comment: pt reports  There is no height or weight on file to calculate BMI. Wt Readings from Last 3 Encounters:  12/11/17 213 lb 8 oz (96.8 kg)  11/10/17 207 lb 4 oz (94 kg)  04/14/17 211 lb 6 oz (95.9 kg)   Temp Readings from Last 3 Encounters:  12/11/17 98.3 F (36.8 C) (Oral)  11/10/17 98.2 F (36.8 C) (Oral)  04/14/17 99.6 F (37.6 C) (Oral)   BP Readings from Last 3 Encounters:  06/28/18 130/70  12/11/17 120/80  11/10/17 120/84   Pulse Readings from Last 3 Encounters:  06/28/18 (!) 57  12/11/17 60  11/10/17 (!) 58    Advanced Directives  06/28/2018 03/10/2016 08/27/2012  Does Patient Have a Medical Advance Directive? No No Patient does not have advance directive;Patient would not like information  Would patient like information on creating a medical advance directive? No - Patient declined No - Patient declined -    Tobacco Social History   Tobacco Use  Smoking Status Former Smoker  Smokeless Tobacco Never Used  Tobacco Comment   quit when he was 20s, only smoked for 5 yrs     Counseling given: Not Answered Comment: quit when he was 20s, only smoked for 5 yrs   Clinical Intake:     Pain : No/denies pain                 Past Medical History:  Diagnosis Date  . DDD (degenerative disc disease), lumbar   . Diabetes (HCC)   . Hyperlipidemia   . Hypertension   . Refuses treatment 11/10/2017   statin   Past Surgical History:  Procedure Laterality Date  . HYDROCELE EXCISION / REPAIR    . KIDNEY STONE SURGERY  2013  . TOOTH EXTRACTION     Full Dentures   Family History  Problem Relation Age of Onset  . Hypertension Mother        Living  . Arthritis Mother   . Stroke Father 9       Deceased  . Alcohol abuse Father   . Heart disease Sister        #  1  . Hypertension Sister        #5  . Diabetes Brother        Deceased  . Stroke Brother        #2  . Alcohol abuse Brother        #2  . Throat cancer Brother        #3  . Arthritis/Rheumatoid Daughter   . Healthy Daughter        #2  . Alcohol abuse Son   . Heart attack Neg Hx    Social History   Socioeconomic History  . Marital status: Married    Spouse name: Not on file  . Number of children: Not on file  . Years of education: Not on file  . Highest education level: Not on file  Occupational History  . Not on file  Social Needs  . Financial resource strain: Not on file  . Food insecurity:    Worry: Not on file    Inability: Not on file  . Transportation needs:    Medical: Not on file    Non-medical: Not on file  Tobacco Use   . Smoking status: Former Games developer  . Smokeless tobacco: Never Used  . Tobacco comment: quit when he was 20s, only smoked for 5 yrs  Substance and Sexual Activity  . Alcohol use: No    Alcohol/week: 0.0 standard drinks  . Drug use: No  . Sexual activity: Yes  Lifestyle  . Physical activity:    Days per week: Not on file    Minutes per session: Not on file  . Stress: Not on file  Relationships  . Social connections:    Talks on phone: Not on file    Gets together: Not on file    Attends religious service: Not on file    Active member of club or organization: Not on file    Attends meetings of clubs or organizations: Not on file    Relationship status: Not on file  Other Topics Concern  . Not on file  Social History Narrative  . Not on file    Outpatient Encounter Medications as of 06/28/2018  Medication Sig  . hydrochlorothiazide (HYDRODIURIL) 25 MG tablet Take 1 tablet (25 mg total) by mouth daily.  Marland Kitchen losartan (COZAAR) 100 MG tablet Take 1 tablet (100 mg total) by mouth daily.  . [DISCONTINUED] aspirin 81 MG tablet Take 81 mg by mouth daily.   No facility-administered encounter medications on file as of 06/28/2018.     Activities of Daily Living In your present state of health, do you have any difficulty performing the following activities: 06/28/2018  Hearing? N  Vision? N  Comment wears reading.  Difficulty concentrating or making decisions? N  Walking or climbing stairs? N  Dressing or bathing? N  Doing errands, shopping? N  Preparing Food and eating ? N  Using the Toilet? N  In the past six months, have you accidently leaked urine? N  Do you have problems with loss of bowel control? N  Managing your Medications? N  Managing your Finances? N  Housekeeping or managing your Housekeeping? N  Some recent data might be hidden    Patient Care Team: Sharlene Dory, DO as PCP - General (Family Medicine)    Assessment:   This is a routine wellness examination  for Maxwell Hanson. Physical assessment deferred to PCP.  Exercise Activities and Dietary recommendations Current Exercise Habits: Home exercise routine, Time (Minutes): 50, Frequency (Times/Week): 3,  Weekly Exercise (Minutes/Week): 150, Intensity: Mild, Exercise limited by: None identified Diet (meal preparation, eat out, water intake, caffeinated beverages, dairy products, fruits and vegetables): in general, a "healthy" diet  , well balanced, on average, 3 meals per day. Mostly vegetarian diet. occasional chicken or fish.   Goals    . Be happy.       Fall Risk Fall Risk  06/28/2018 03/10/2016 09/30/2015 06/03/2014  Falls in the past year? 0 No No No    Depression Screen PHQ 2/9 Scores 06/28/2018 03/10/2016 09/30/2015 06/03/2014  PHQ - 2 Score 0 0 0 0  PHQ- 9 Score - - 0 -     Cognitive Function Ad8 score reviewed for issues:  Issues making decisions:no  Less interest in hobbies / activities:no  Repeats questions, stories (family complaining):no  Trouble using ordinary gadgets (microwave, computer, phone):no  Forgets the month or year: no  Mismanaging finances: no  Remembering appts:no  Daily problems with thinking and/or memory:no Ad8 score is=0    MMSE - Mini Mental State Exam 03/10/2016  Orientation to time 5  Orientation to Place 5  Registration 3  Attention/ Calculation 5  Recall 3  Language- name 2 objects 2  Language- repeat 1  Language- follow 3 step command 3  Language- read & follow direction 1  Write a sentence 1  Copy design 1  Total score 30        Immunization History  Administered Date(s) Administered  . Pneumococcal Conjugate-13 07/13/2016  . Pneumococcal Polysaccharide-23 11/10/2017  . Tdap 02/08/2011    Screening Tests Health Maintenance  Topic Date Due  . OPHTHALMOLOGY EXAM  03/24/2017  . INFLUENZA VACCINE  09/08/2018  . FOOT EXAM  11/11/2018  . HEMOGLOBIN A1C  11/21/2018  . Fecal DNA (Cologuard)  11/10/2020  . TETANUS/TDAP  02/07/2021  .  Hepatitis C Screening  Completed  . PNA vac Low Risk Adult  Completed      Plan:   See you next year! Keep up the great work!  I have personally reviewed and noted the following in the patient's chart:   . Medical and social history . Use of alcohol, tobacco or illicit drugs  . Current medications and supplements . Functional ability and status . Nutritional status . Physical activity . Advanced directives . List of other physicians . Hospitalizations, surgeries, and ER visits in previous 12 months . Vitals . Screenings to include cognitive, depression, and falls . Referrals and appointments  In addition, I have reviewed and discussed with patient certain preventive protocols, quality metrics, and best practice recommendations. A written personalized care plan for preventive services as well as general preventive health recommendations were provided to patient.     Avon GullyBritt, Jeyden Coffelt Angel, [place]  06/28/2018

## 2018-06-28 ENCOUNTER — Encounter: Payer: Self-pay | Admitting: *Deleted

## 2018-06-28 ENCOUNTER — Other Ambulatory Visit: Payer: Self-pay

## 2018-06-28 ENCOUNTER — Ambulatory Visit (INDEPENDENT_AMBULATORY_CARE_PROVIDER_SITE_OTHER): Payer: Medicare Other | Admitting: *Deleted

## 2018-06-28 VITALS — BP 130/70 | HR 57

## 2018-06-28 DIAGNOSIS — Z0000 Encounter for general adult medical examination without abnormal findings: Secondary | ICD-10-CM | POA: Diagnosis not present

## 2018-06-28 NOTE — Patient Instructions (Signed)
See you next year! Keep up the great work!   Mr. Maxwell Hanson , Thank you for taking time to come for your Medicare Wellness Visit. I appreciate your ongoing commitment to your health goals. Please review the following plan we discussed and let me know if I can assist you in the future.   These are the goals we discussed: Goals    . Be happy.       This is a list of the screening recommended for you and due dates:  Health Maintenance  Topic Date Due  . Eye exam for diabetics  03/24/2017  . Flu Shot  09/08/2018  . Complete foot exam   11/11/2018  . Hemoglobin A1C  11/21/2018  . Cologuard (Stool DNA test)  11/10/2020  . Tetanus Vaccine  02/07/2021  .  Hepatitis C: One time screening is recommended by Center for Disease Control  (CDC) for  adults born from [birth year] through 1965.   Completed  . Pneumonia vaccines  Completed    Health Maintenance After Age 68 After age 68, you are at a higher risk for certain long-term diseases and infections as well as injuries from falls. Falls are a major cause of broken bones and head injuries in people who are older than age 165. Getting regular preventive care can help to keep you healthy and well. Preventive care includes getting regular testing and making lifestyle changes as recommended by your health care provider. Talk with your health care provider about:  Which screenings and tests you should have. A screening is a test that checks for a disease when you have no symptoms.  A diet and exercise plan that is right for you. What should I know about screenings and tests to prevent falls? Screening and testing are the best ways to find a health problem early. Early diagnosis and treatment give you the best chance of managing medical conditions that are common after age 68. Certain conditions and lifestyle choices may make you more likely to have a fall. Your health care provider may recommend:  Regular vision checks. Poor vision and conditions such as  cataracts can make you more likely to have a fall. If you wear glasses, make sure to get your prescription updated if your vision changes.  Medicine review. Work with your health care provider to regularly review all of the medicines you are taking, including over-the-counter medicines. Ask your health care provider about any side effects that may make you more likely to have a fall. Tell your health care provider if any medicines that you take make you feel dizzy or sleepy.  Osteoporosis screening. Osteoporosis is a condition that causes the bones to get weaker. This can make the bones weak and cause them to break more easily.  Blood pressure screening. Blood pressure changes and medicines to control blood pressure can make you feel dizzy.  Strength and balance checks. Your health care provider may recommend certain tests to check your strength and balance while standing, walking, or changing positions.  Foot health exam. Foot pain and numbness, as well as not wearing proper footwear, can make you more likely to have a fall.  Depression screening. You may be more likely to have a fall if you have a fear of falling, feel emotionally low, or feel unable to do activities that you used to do.  Alcohol use screening. Using too much alcohol can affect your balance and may make you more likely to have a fall. What actions can I take  to lower my risk of falls? General instructions  Talk with your health care provider about your risks for falling. Tell your health care provider if: ? You fall. Be sure to tell your health care provider about all falls, even ones that seem minor. ? You feel dizzy, sleepy, or off-balance.  Take over-the-counter and prescription medicines only as told by your health care provider. These include any supplements.  Eat a healthy diet and maintain a healthy weight. A healthy diet includes low-fat dairy products, low-fat (lean) meats, and fiber from whole grains, beans, and  lots of fruits and vegetables. Home safety  Remove any tripping hazards, such as rugs, cords, and clutter.  Install safety equipment such as grab bars in bathrooms and safety rails on stairs.  Keep rooms and walkways well-lit. Activity   Follow a regular exercise program to stay fit. This will help you maintain your balance. Ask your health care provider what types of exercise are appropriate for you.  If you need a cane or walker, use it as recommended by your health care provider.  Wear supportive shoes that have nonskid soles. Lifestyle  Do not drink alcohol if your health care provider tells you not to drink.  If you drink alcohol, limit how much you have: ? 0-1 drink a day for women. ? 0-2 drinks a day for men.  Be aware of how much alcohol is in your drink. In the [place] one drink equals one typical bottle of beer (12 oz), one-half glass of wine (5 oz), or one shot of hard liquor (1 oz).  Do not use any products that contain nicotine or tobacco, such as cigarettes and e-cigarettes. If you need help quitting, ask your health care provider. Summary  Having a healthy lifestyle and getting preventive care can help to protect your health and wellness after age 56.  Screening and testing are the best way to find a health problem early and help you avoid having a fall. Early diagnosis and treatment give you the best chance for managing medical conditions that are more common for people who are older than age 16.  Falls are a major cause of broken bones and head injuries in people who are older than age 81. Take precautions to prevent a fall at home.  Work with your health care provider to learn what changes you can make to improve your health and wellness and to prevent falls. This information is not intended to replace advice given to you by your health care provider. Make sure you discuss any questions you have with your health care provider. Document Released: 12/07/2016  Document Revised: 12/07/2016 Document Reviewed: 12/07/2016 Elsevier Interactive Patient Education  2019 [employer].

## 2018-06-28 NOTE — Progress Notes (Signed)
Noted. Agree with above.  Jilda Roche [place], DO 06/28/18 3:13 PM

## 2018-07-03 DIAGNOSIS — M9903 Segmental and somatic dysfunction of lumbar region: Secondary | ICD-10-CM | POA: Diagnosis not present

## 2018-07-03 DIAGNOSIS — G544 Lumbosacral root disorders, not elsewhere classified: Secondary | ICD-10-CM | POA: Diagnosis not present

## 2018-07-03 DIAGNOSIS — M9905 Segmental and somatic dysfunction of pelvic region: Secondary | ICD-10-CM | POA: Diagnosis not present

## 2018-07-03 DIAGNOSIS — M9904 Segmental and somatic dysfunction of sacral region: Secondary | ICD-10-CM | POA: Diagnosis not present

## 2018-07-04 DIAGNOSIS — M9904 Segmental and somatic dysfunction of sacral region: Secondary | ICD-10-CM | POA: Diagnosis not present

## 2018-07-04 DIAGNOSIS — M9903 Segmental and somatic dysfunction of lumbar region: Secondary | ICD-10-CM | POA: Diagnosis not present

## 2018-07-04 DIAGNOSIS — M9905 Segmental and somatic dysfunction of pelvic region: Secondary | ICD-10-CM | POA: Diagnosis not present

## 2018-07-04 DIAGNOSIS — G544 Lumbosacral root disorders, not elsewhere classified: Secondary | ICD-10-CM | POA: Diagnosis not present

## 2018-07-05 DIAGNOSIS — M9901 Segmental and somatic dysfunction of cervical region: Secondary | ICD-10-CM | POA: Diagnosis not present

## 2018-07-09 DIAGNOSIS — M9904 Segmental and somatic dysfunction of sacral region: Secondary | ICD-10-CM | POA: Diagnosis not present

## 2018-07-09 DIAGNOSIS — M9903 Segmental and somatic dysfunction of lumbar region: Secondary | ICD-10-CM | POA: Diagnosis not present

## 2018-07-09 DIAGNOSIS — G544 Lumbosacral root disorders, not elsewhere classified: Secondary | ICD-10-CM | POA: Diagnosis not present

## 2018-07-09 DIAGNOSIS — M9905 Segmental and somatic dysfunction of pelvic region: Secondary | ICD-10-CM | POA: Diagnosis not present

## 2018-07-10 DIAGNOSIS — G544 Lumbosacral root disorders, not elsewhere classified: Secondary | ICD-10-CM | POA: Diagnosis not present

## 2018-07-10 DIAGNOSIS — M9903 Segmental and somatic dysfunction of lumbar region: Secondary | ICD-10-CM | POA: Diagnosis not present

## 2018-07-10 DIAGNOSIS — M9905 Segmental and somatic dysfunction of pelvic region: Secondary | ICD-10-CM | POA: Diagnosis not present

## 2018-07-10 DIAGNOSIS — M9904 Segmental and somatic dysfunction of sacral region: Secondary | ICD-10-CM | POA: Diagnosis not present

## 2018-07-11 DIAGNOSIS — M9905 Segmental and somatic dysfunction of pelvic region: Secondary | ICD-10-CM | POA: Diagnosis not present

## 2018-07-11 DIAGNOSIS — M9903 Segmental and somatic dysfunction of lumbar region: Secondary | ICD-10-CM | POA: Diagnosis not present

## 2018-07-11 DIAGNOSIS — M9904 Segmental and somatic dysfunction of sacral region: Secondary | ICD-10-CM | POA: Diagnosis not present

## 2018-07-11 DIAGNOSIS — G543 Thoracic root disorders, not elsewhere classified: Secondary | ICD-10-CM | POA: Diagnosis not present

## 2018-07-12 DIAGNOSIS — M9904 Segmental and somatic dysfunction of sacral region: Secondary | ICD-10-CM | POA: Diagnosis not present

## 2018-07-12 DIAGNOSIS — M9903 Segmental and somatic dysfunction of lumbar region: Secondary | ICD-10-CM | POA: Diagnosis not present

## 2018-07-12 DIAGNOSIS — G544 Lumbosacral root disorders, not elsewhere classified: Secondary | ICD-10-CM | POA: Diagnosis not present

## 2018-07-12 DIAGNOSIS — M9905 Segmental and somatic dysfunction of pelvic region: Secondary | ICD-10-CM | POA: Diagnosis not present

## 2018-07-16 DIAGNOSIS — M9903 Segmental and somatic dysfunction of lumbar region: Secondary | ICD-10-CM | POA: Diagnosis not present

## 2018-07-16 DIAGNOSIS — M9904 Segmental and somatic dysfunction of sacral region: Secondary | ICD-10-CM | POA: Diagnosis not present

## 2018-07-16 DIAGNOSIS — G544 Lumbosacral root disorders, not elsewhere classified: Secondary | ICD-10-CM | POA: Diagnosis not present

## 2018-07-16 DIAGNOSIS — M9905 Segmental and somatic dysfunction of pelvic region: Secondary | ICD-10-CM | POA: Diagnosis not present

## 2018-07-17 DIAGNOSIS — M9905 Segmental and somatic dysfunction of pelvic region: Secondary | ICD-10-CM | POA: Diagnosis not present

## 2018-07-17 DIAGNOSIS — M9903 Segmental and somatic dysfunction of lumbar region: Secondary | ICD-10-CM | POA: Diagnosis not present

## 2018-07-17 DIAGNOSIS — G544 Lumbosacral root disorders, not elsewhere classified: Secondary | ICD-10-CM | POA: Diagnosis not present

## 2018-07-17 DIAGNOSIS — M9904 Segmental and somatic dysfunction of sacral region: Secondary | ICD-10-CM | POA: Diagnosis not present

## 2018-07-18 DIAGNOSIS — M9903 Segmental and somatic dysfunction of lumbar region: Secondary | ICD-10-CM | POA: Diagnosis not present

## 2018-07-18 DIAGNOSIS — M9904 Segmental and somatic dysfunction of sacral region: Secondary | ICD-10-CM | POA: Diagnosis not present

## 2018-07-18 DIAGNOSIS — M9905 Segmental and somatic dysfunction of pelvic region: Secondary | ICD-10-CM | POA: Diagnosis not present

## 2018-07-18 DIAGNOSIS — G544 Lumbosacral root disorders, not elsewhere classified: Secondary | ICD-10-CM | POA: Diagnosis not present

## 2018-07-19 DIAGNOSIS — M9903 Segmental and somatic dysfunction of lumbar region: Secondary | ICD-10-CM | POA: Diagnosis not present

## 2018-07-19 DIAGNOSIS — G544 Lumbosacral root disorders, not elsewhere classified: Secondary | ICD-10-CM | POA: Diagnosis not present

## 2018-07-19 DIAGNOSIS — M9905 Segmental and somatic dysfunction of pelvic region: Secondary | ICD-10-CM | POA: Diagnosis not present

## 2018-07-19 DIAGNOSIS — M9904 Segmental and somatic dysfunction of sacral region: Secondary | ICD-10-CM | POA: Diagnosis not present

## 2018-07-23 DIAGNOSIS — G544 Lumbosacral root disorders, not elsewhere classified: Secondary | ICD-10-CM | POA: Diagnosis not present

## 2018-07-23 DIAGNOSIS — M9903 Segmental and somatic dysfunction of lumbar region: Secondary | ICD-10-CM | POA: Diagnosis not present

## 2018-07-23 DIAGNOSIS — M9905 Segmental and somatic dysfunction of pelvic region: Secondary | ICD-10-CM | POA: Diagnosis not present

## 2018-07-23 DIAGNOSIS — M9904 Segmental and somatic dysfunction of sacral region: Secondary | ICD-10-CM | POA: Diagnosis not present

## 2018-07-24 DIAGNOSIS — M9904 Segmental and somatic dysfunction of sacral region: Secondary | ICD-10-CM | POA: Diagnosis not present

## 2018-07-24 DIAGNOSIS — G544 Lumbosacral root disorders, not elsewhere classified: Secondary | ICD-10-CM | POA: Diagnosis not present

## 2018-07-24 DIAGNOSIS — M9903 Segmental and somatic dysfunction of lumbar region: Secondary | ICD-10-CM | POA: Diagnosis not present

## 2018-07-24 DIAGNOSIS — M9905 Segmental and somatic dysfunction of pelvic region: Secondary | ICD-10-CM | POA: Diagnosis not present

## 2018-07-25 DIAGNOSIS — M9905 Segmental and somatic dysfunction of pelvic region: Secondary | ICD-10-CM | POA: Diagnosis not present

## 2018-07-25 DIAGNOSIS — M9904 Segmental and somatic dysfunction of sacral region: Secondary | ICD-10-CM | POA: Diagnosis not present

## 2018-07-25 DIAGNOSIS — M9903 Segmental and somatic dysfunction of lumbar region: Secondary | ICD-10-CM | POA: Diagnosis not present

## 2018-07-25 DIAGNOSIS — G544 Lumbosacral root disorders, not elsewhere classified: Secondary | ICD-10-CM | POA: Diagnosis not present

## 2018-07-26 DIAGNOSIS — M9904 Segmental and somatic dysfunction of sacral region: Secondary | ICD-10-CM | POA: Diagnosis not present

## 2018-07-26 DIAGNOSIS — M9901 Segmental and somatic dysfunction of cervical region: Secondary | ICD-10-CM | POA: Diagnosis not present

## 2018-07-26 DIAGNOSIS — M9903 Segmental and somatic dysfunction of lumbar region: Secondary | ICD-10-CM | POA: Diagnosis not present

## 2018-07-26 DIAGNOSIS — M9900 Segmental and somatic dysfunction of head region: Secondary | ICD-10-CM | POA: Diagnosis not present

## 2018-07-26 DIAGNOSIS — M9902 Segmental and somatic dysfunction of thoracic region: Secondary | ICD-10-CM | POA: Diagnosis not present

## 2018-07-30 DIAGNOSIS — M9903 Segmental and somatic dysfunction of lumbar region: Secondary | ICD-10-CM | POA: Diagnosis not present

## 2018-07-30 DIAGNOSIS — M9904 Segmental and somatic dysfunction of sacral region: Secondary | ICD-10-CM | POA: Diagnosis not present

## 2018-07-30 DIAGNOSIS — G544 Lumbosacral root disorders, not elsewhere classified: Secondary | ICD-10-CM | POA: Diagnosis not present

## 2018-07-30 DIAGNOSIS — M9905 Segmental and somatic dysfunction of pelvic region: Secondary | ICD-10-CM | POA: Diagnosis not present

## 2018-07-31 DIAGNOSIS — G544 Lumbosacral root disorders, not elsewhere classified: Secondary | ICD-10-CM | POA: Diagnosis not present

## 2018-07-31 DIAGNOSIS — M9905 Segmental and somatic dysfunction of pelvic region: Secondary | ICD-10-CM | POA: Diagnosis not present

## 2018-07-31 DIAGNOSIS — M9904 Segmental and somatic dysfunction of sacral region: Secondary | ICD-10-CM | POA: Diagnosis not present

## 2018-07-31 DIAGNOSIS — M9903 Segmental and somatic dysfunction of lumbar region: Secondary | ICD-10-CM | POA: Diagnosis not present

## 2018-08-01 DIAGNOSIS — M9905 Segmental and somatic dysfunction of pelvic region: Secondary | ICD-10-CM | POA: Diagnosis not present

## 2018-08-01 DIAGNOSIS — M9903 Segmental and somatic dysfunction of lumbar region: Secondary | ICD-10-CM | POA: Diagnosis not present

## 2018-08-01 DIAGNOSIS — G544 Lumbosacral root disorders, not elsewhere classified: Secondary | ICD-10-CM | POA: Diagnosis not present

## 2018-08-01 DIAGNOSIS — M9904 Segmental and somatic dysfunction of sacral region: Secondary | ICD-10-CM | POA: Diagnosis not present

## 2018-08-02 DIAGNOSIS — M9904 Segmental and somatic dysfunction of sacral region: Secondary | ICD-10-CM | POA: Diagnosis not present

## 2018-08-02 DIAGNOSIS — M9905 Segmental and somatic dysfunction of pelvic region: Secondary | ICD-10-CM | POA: Diagnosis not present

## 2018-08-02 DIAGNOSIS — M9903 Segmental and somatic dysfunction of lumbar region: Secondary | ICD-10-CM | POA: Diagnosis not present

## 2018-08-02 DIAGNOSIS — G544 Lumbosacral root disorders, not elsewhere classified: Secondary | ICD-10-CM | POA: Diagnosis not present

## 2018-08-27 DIAGNOSIS — M9901 Segmental and somatic dysfunction of cervical region: Secondary | ICD-10-CM | POA: Diagnosis not present

## 2018-08-27 DIAGNOSIS — G542 Cervical root disorders, not elsewhere classified: Secondary | ICD-10-CM | POA: Diagnosis not present

## 2018-08-27 DIAGNOSIS — M9902 Segmental and somatic dysfunction of thoracic region: Secondary | ICD-10-CM | POA: Diagnosis not present

## 2018-08-27 DIAGNOSIS — M9903 Segmental and somatic dysfunction of lumbar region: Secondary | ICD-10-CM | POA: Diagnosis not present

## 2018-08-28 DIAGNOSIS — G542 Cervical root disorders, not elsewhere classified: Secondary | ICD-10-CM | POA: Diagnosis not present

## 2018-08-28 DIAGNOSIS — M9903 Segmental and somatic dysfunction of lumbar region: Secondary | ICD-10-CM | POA: Diagnosis not present

## 2018-08-28 DIAGNOSIS — M9902 Segmental and somatic dysfunction of thoracic region: Secondary | ICD-10-CM | POA: Diagnosis not present

## 2018-08-28 DIAGNOSIS — M9901 Segmental and somatic dysfunction of cervical region: Secondary | ICD-10-CM | POA: Diagnosis not present

## 2018-08-29 DIAGNOSIS — G544 Lumbosacral root disorders, not elsewhere classified: Secondary | ICD-10-CM | POA: Diagnosis not present

## 2018-08-29 DIAGNOSIS — M9903 Segmental and somatic dysfunction of lumbar region: Secondary | ICD-10-CM | POA: Diagnosis not present

## 2018-08-29 DIAGNOSIS — M9904 Segmental and somatic dysfunction of sacral region: Secondary | ICD-10-CM | POA: Diagnosis not present

## 2018-08-29 DIAGNOSIS — M9905 Segmental and somatic dysfunction of pelvic region: Secondary | ICD-10-CM | POA: Diagnosis not present

## 2018-08-30 DIAGNOSIS — M9903 Segmental and somatic dysfunction of lumbar region: Secondary | ICD-10-CM | POA: Diagnosis not present

## 2018-08-30 DIAGNOSIS — G544 Lumbosacral root disorders, not elsewhere classified: Secondary | ICD-10-CM | POA: Diagnosis not present

## 2018-08-30 DIAGNOSIS — M9905 Segmental and somatic dysfunction of pelvic region: Secondary | ICD-10-CM | POA: Diagnosis not present

## 2018-08-30 DIAGNOSIS — M9904 Segmental and somatic dysfunction of sacral region: Secondary | ICD-10-CM | POA: Diagnosis not present

## 2018-09-04 DIAGNOSIS — M9905 Segmental and somatic dysfunction of pelvic region: Secondary | ICD-10-CM | POA: Diagnosis not present

## 2018-09-04 DIAGNOSIS — G544 Lumbosacral root disorders, not elsewhere classified: Secondary | ICD-10-CM | POA: Diagnosis not present

## 2018-09-04 DIAGNOSIS — M9904 Segmental and somatic dysfunction of sacral region: Secondary | ICD-10-CM | POA: Diagnosis not present

## 2018-09-04 DIAGNOSIS — M9903 Segmental and somatic dysfunction of lumbar region: Secondary | ICD-10-CM | POA: Diagnosis not present

## 2018-09-05 DIAGNOSIS — M9905 Segmental and somatic dysfunction of pelvic region: Secondary | ICD-10-CM | POA: Diagnosis not present

## 2018-09-05 DIAGNOSIS — M9903 Segmental and somatic dysfunction of lumbar region: Secondary | ICD-10-CM | POA: Diagnosis not present

## 2018-09-05 DIAGNOSIS — G544 Lumbosacral root disorders, not elsewhere classified: Secondary | ICD-10-CM | POA: Diagnosis not present

## 2018-09-05 DIAGNOSIS — M9904 Segmental and somatic dysfunction of sacral region: Secondary | ICD-10-CM | POA: Diagnosis not present

## 2018-09-10 DIAGNOSIS — M9904 Segmental and somatic dysfunction of sacral region: Secondary | ICD-10-CM | POA: Diagnosis not present

## 2018-09-10 DIAGNOSIS — M9905 Segmental and somatic dysfunction of pelvic region: Secondary | ICD-10-CM | POA: Diagnosis not present

## 2018-09-10 DIAGNOSIS — G544 Lumbosacral root disorders, not elsewhere classified: Secondary | ICD-10-CM | POA: Diagnosis not present

## 2018-09-10 DIAGNOSIS — M9903 Segmental and somatic dysfunction of lumbar region: Secondary | ICD-10-CM | POA: Diagnosis not present

## 2018-09-11 DIAGNOSIS — G544 Lumbosacral root disorders, not elsewhere classified: Secondary | ICD-10-CM | POA: Diagnosis not present

## 2018-09-11 DIAGNOSIS — M9905 Segmental and somatic dysfunction of pelvic region: Secondary | ICD-10-CM | POA: Diagnosis not present

## 2018-09-11 DIAGNOSIS — M9903 Segmental and somatic dysfunction of lumbar region: Secondary | ICD-10-CM | POA: Diagnosis not present

## 2018-09-11 DIAGNOSIS — M9904 Segmental and somatic dysfunction of sacral region: Secondary | ICD-10-CM | POA: Diagnosis not present

## 2018-09-17 DIAGNOSIS — M9904 Segmental and somatic dysfunction of sacral region: Secondary | ICD-10-CM | POA: Diagnosis not present

## 2018-09-17 DIAGNOSIS — M9903 Segmental and somatic dysfunction of lumbar region: Secondary | ICD-10-CM | POA: Diagnosis not present

## 2018-09-17 DIAGNOSIS — G544 Lumbosacral root disorders, not elsewhere classified: Secondary | ICD-10-CM | POA: Diagnosis not present

## 2018-09-17 DIAGNOSIS — M9905 Segmental and somatic dysfunction of pelvic region: Secondary | ICD-10-CM | POA: Diagnosis not present

## 2018-09-18 DIAGNOSIS — M9905 Segmental and somatic dysfunction of pelvic region: Secondary | ICD-10-CM | POA: Diagnosis not present

## 2018-09-18 DIAGNOSIS — G544 Lumbosacral root disorders, not elsewhere classified: Secondary | ICD-10-CM | POA: Diagnosis not present

## 2018-09-18 DIAGNOSIS — M9904 Segmental and somatic dysfunction of sacral region: Secondary | ICD-10-CM | POA: Diagnosis not present

## 2018-09-18 DIAGNOSIS — M9903 Segmental and somatic dysfunction of lumbar region: Secondary | ICD-10-CM | POA: Diagnosis not present

## 2018-09-19 DIAGNOSIS — M9905 Segmental and somatic dysfunction of pelvic region: Secondary | ICD-10-CM | POA: Diagnosis not present

## 2018-09-19 DIAGNOSIS — M9904 Segmental and somatic dysfunction of sacral region: Secondary | ICD-10-CM | POA: Diagnosis not present

## 2018-09-19 DIAGNOSIS — G544 Lumbosacral root disorders, not elsewhere classified: Secondary | ICD-10-CM | POA: Diagnosis not present

## 2018-09-19 DIAGNOSIS — M9903 Segmental and somatic dysfunction of lumbar region: Secondary | ICD-10-CM | POA: Diagnosis not present

## 2018-09-24 ENCOUNTER — Other Ambulatory Visit: Payer: Self-pay | Admitting: Family Medicine

## 2018-09-24 DIAGNOSIS — M9905 Segmental and somatic dysfunction of pelvic region: Secondary | ICD-10-CM | POA: Diagnosis not present

## 2018-09-24 DIAGNOSIS — G544 Lumbosacral root disorders, not elsewhere classified: Secondary | ICD-10-CM | POA: Diagnosis not present

## 2018-09-24 DIAGNOSIS — I10 Essential (primary) hypertension: Secondary | ICD-10-CM

## 2018-09-24 DIAGNOSIS — M9904 Segmental and somatic dysfunction of sacral region: Secondary | ICD-10-CM | POA: Diagnosis not present

## 2018-09-24 DIAGNOSIS — M9903 Segmental and somatic dysfunction of lumbar region: Secondary | ICD-10-CM | POA: Diagnosis not present

## 2018-09-24 MED ORDER — HYDROCHLOROTHIAZIDE 25 MG PO TABS: 25.0000 mg | ORAL_TABLET | Freq: Every day | ORAL | 1 refills | Status: DC

## 2018-09-24 NOTE — Telephone Encounter (Signed)
Medication Refill - Medication: hydrochlorothiazide (HYDRODIURIL) 25 MG tablet    Has the patient contacted their pharmacy? Yes.   (Agent: If no, request that the patient contact the pharmacy for the refill.) (Agent: If yes, when and what did the pharmacy advise?)  Preferred Pharmacy (with phone number or street name): [facility] [phone] (Phone) [phone] (Fax)    Agent: Please be advised that RX refills may take up to 3 business days. We ask that you follow-up with your pharmacy.

## 2018-09-25 DIAGNOSIS — G544 Lumbosacral root disorders, not elsewhere classified: Secondary | ICD-10-CM | POA: Diagnosis not present

## 2018-09-25 DIAGNOSIS — M9905 Segmental and somatic dysfunction of pelvic region: Secondary | ICD-10-CM | POA: Diagnosis not present

## 2018-09-25 DIAGNOSIS — M9904 Segmental and somatic dysfunction of sacral region: Secondary | ICD-10-CM | POA: Diagnosis not present

## 2018-09-25 DIAGNOSIS — M9903 Segmental and somatic dysfunction of lumbar region: Secondary | ICD-10-CM | POA: Diagnosis not present

## 2018-09-26 DIAGNOSIS — M9904 Segmental and somatic dysfunction of sacral region: Secondary | ICD-10-CM | POA: Diagnosis not present

## 2018-09-26 DIAGNOSIS — G544 Lumbosacral root disorders, not elsewhere classified: Secondary | ICD-10-CM | POA: Diagnosis not present

## 2018-09-26 DIAGNOSIS — M9905 Segmental and somatic dysfunction of pelvic region: Secondary | ICD-10-CM | POA: Diagnosis not present

## 2018-09-26 DIAGNOSIS — M9903 Segmental and somatic dysfunction of lumbar region: Secondary | ICD-10-CM | POA: Diagnosis not present

## 2018-10-01 DIAGNOSIS — M9905 Segmental and somatic dysfunction of pelvic region: Secondary | ICD-10-CM | POA: Diagnosis not present

## 2018-10-01 DIAGNOSIS — G544 Lumbosacral root disorders, not elsewhere classified: Secondary | ICD-10-CM | POA: Diagnosis not present

## 2018-10-01 DIAGNOSIS — M9904 Segmental and somatic dysfunction of sacral region: Secondary | ICD-10-CM | POA: Diagnosis not present

## 2018-10-01 DIAGNOSIS — M9903 Segmental and somatic dysfunction of lumbar region: Secondary | ICD-10-CM | POA: Diagnosis not present

## 2018-10-02 DIAGNOSIS — G544 Lumbosacral root disorders, not elsewhere classified: Secondary | ICD-10-CM | POA: Diagnosis not present

## 2018-10-02 DIAGNOSIS — M9905 Segmental and somatic dysfunction of pelvic region: Secondary | ICD-10-CM | POA: Diagnosis not present

## 2018-10-02 DIAGNOSIS — M9904 Segmental and somatic dysfunction of sacral region: Secondary | ICD-10-CM | POA: Diagnosis not present

## 2018-10-02 DIAGNOSIS — M9903 Segmental and somatic dysfunction of lumbar region: Secondary | ICD-10-CM | POA: Diagnosis not present

## 2018-10-03 DIAGNOSIS — M9905 Segmental and somatic dysfunction of pelvic region: Secondary | ICD-10-CM | POA: Diagnosis not present

## 2018-10-03 DIAGNOSIS — M9903 Segmental and somatic dysfunction of lumbar region: Secondary | ICD-10-CM | POA: Diagnosis not present

## 2018-10-03 DIAGNOSIS — G544 Lumbosacral root disorders, not elsewhere classified: Secondary | ICD-10-CM | POA: Diagnosis not present

## 2018-10-03 DIAGNOSIS — M9904 Segmental and somatic dysfunction of sacral region: Secondary | ICD-10-CM | POA: Diagnosis not present

## 2018-10-09 DIAGNOSIS — M9904 Segmental and somatic dysfunction of sacral region: Secondary | ICD-10-CM | POA: Diagnosis not present

## 2018-10-09 DIAGNOSIS — M9905 Segmental and somatic dysfunction of pelvic region: Secondary | ICD-10-CM | POA: Diagnosis not present

## 2018-10-09 DIAGNOSIS — G544 Lumbosacral root disorders, not elsewhere classified: Secondary | ICD-10-CM | POA: Diagnosis not present

## 2018-10-09 DIAGNOSIS — M9903 Segmental and somatic dysfunction of lumbar region: Secondary | ICD-10-CM | POA: Diagnosis not present

## 2018-10-10 DIAGNOSIS — G542 Cervical root disorders, not elsewhere classified: Secondary | ICD-10-CM | POA: Diagnosis not present

## 2018-10-10 DIAGNOSIS — M9901 Segmental and somatic dysfunction of cervical region: Secondary | ICD-10-CM | POA: Diagnosis not present

## 2018-10-10 DIAGNOSIS — M9902 Segmental and somatic dysfunction of thoracic region: Secondary | ICD-10-CM | POA: Diagnosis not present

## 2018-10-10 DIAGNOSIS — M9903 Segmental and somatic dysfunction of lumbar region: Secondary | ICD-10-CM | POA: Diagnosis not present

## 2018-10-16 DIAGNOSIS — M9901 Segmental and somatic dysfunction of cervical region: Secondary | ICD-10-CM | POA: Diagnosis not present

## 2018-10-16 DIAGNOSIS — M9902 Segmental and somatic dysfunction of thoracic region: Secondary | ICD-10-CM | POA: Diagnosis not present

## 2018-10-16 DIAGNOSIS — G542 Cervical root disorders, not elsewhere classified: Secondary | ICD-10-CM | POA: Diagnosis not present

## 2018-10-16 DIAGNOSIS — M9903 Segmental and somatic dysfunction of lumbar region: Secondary | ICD-10-CM | POA: Diagnosis not present

## 2018-10-17 DIAGNOSIS — M9903 Segmental and somatic dysfunction of lumbar region: Secondary | ICD-10-CM | POA: Diagnosis not present

## 2018-10-17 DIAGNOSIS — M9904 Segmental and somatic dysfunction of sacral region: Secondary | ICD-10-CM | POA: Diagnosis not present

## 2018-10-17 DIAGNOSIS — G544 Lumbosacral root disorders, not elsewhere classified: Secondary | ICD-10-CM | POA: Diagnosis not present

## 2018-10-17 DIAGNOSIS — M9905 Segmental and somatic dysfunction of pelvic region: Secondary | ICD-10-CM | POA: Diagnosis not present

## 2018-10-22 DIAGNOSIS — M9903 Segmental and somatic dysfunction of lumbar region: Secondary | ICD-10-CM | POA: Diagnosis not present

## 2018-10-22 DIAGNOSIS — M9905 Segmental and somatic dysfunction of pelvic region: Secondary | ICD-10-CM | POA: Diagnosis not present

## 2018-10-22 DIAGNOSIS — M9904 Segmental and somatic dysfunction of sacral region: Secondary | ICD-10-CM | POA: Diagnosis not present

## 2018-10-22 DIAGNOSIS — G544 Lumbosacral root disorders, not elsewhere classified: Secondary | ICD-10-CM | POA: Diagnosis not present

## 2018-10-23 DIAGNOSIS — M9903 Segmental and somatic dysfunction of lumbar region: Secondary | ICD-10-CM | POA: Diagnosis not present

## 2018-10-23 DIAGNOSIS — M9904 Segmental and somatic dysfunction of sacral region: Secondary | ICD-10-CM | POA: Diagnosis not present

## 2018-10-23 DIAGNOSIS — M9905 Segmental and somatic dysfunction of pelvic region: Secondary | ICD-10-CM | POA: Diagnosis not present

## 2018-10-23 DIAGNOSIS — G544 Lumbosacral root disorders, not elsewhere classified: Secondary | ICD-10-CM | POA: Diagnosis not present

## 2018-10-24 DIAGNOSIS — M9902 Segmental and somatic dysfunction of thoracic region: Secondary | ICD-10-CM | POA: Diagnosis not present

## 2018-10-24 DIAGNOSIS — M9901 Segmental and somatic dysfunction of cervical region: Secondary | ICD-10-CM | POA: Diagnosis not present

## 2018-10-24 DIAGNOSIS — M9903 Segmental and somatic dysfunction of lumbar region: Secondary | ICD-10-CM | POA: Diagnosis not present

## 2018-10-24 DIAGNOSIS — G542 Cervical root disorders, not elsewhere classified: Secondary | ICD-10-CM | POA: Diagnosis not present

## 2018-10-29 DIAGNOSIS — G544 Lumbosacral root disorders, not elsewhere classified: Secondary | ICD-10-CM | POA: Diagnosis not present

## 2018-10-29 DIAGNOSIS — M9903 Segmental and somatic dysfunction of lumbar region: Secondary | ICD-10-CM | POA: Diagnosis not present

## 2018-10-29 DIAGNOSIS — M9904 Segmental and somatic dysfunction of sacral region: Secondary | ICD-10-CM | POA: Diagnosis not present

## 2018-10-29 DIAGNOSIS — M9905 Segmental and somatic dysfunction of pelvic region: Secondary | ICD-10-CM | POA: Diagnosis not present

## 2018-10-30 DIAGNOSIS — M9903 Segmental and somatic dysfunction of lumbar region: Secondary | ICD-10-CM | POA: Diagnosis not present

## 2018-10-30 DIAGNOSIS — M9904 Segmental and somatic dysfunction of sacral region: Secondary | ICD-10-CM | POA: Diagnosis not present

## 2018-10-30 DIAGNOSIS — G544 Lumbosacral root disorders, not elsewhere classified: Secondary | ICD-10-CM | POA: Diagnosis not present

## 2018-10-30 DIAGNOSIS — M9905 Segmental and somatic dysfunction of pelvic region: Secondary | ICD-10-CM | POA: Diagnosis not present

## 2018-10-31 DIAGNOSIS — M9904 Segmental and somatic dysfunction of sacral region: Secondary | ICD-10-CM | POA: Diagnosis not present

## 2018-10-31 DIAGNOSIS — M9903 Segmental and somatic dysfunction of lumbar region: Secondary | ICD-10-CM | POA: Diagnosis not present

## 2018-10-31 DIAGNOSIS — G544 Lumbosacral root disorders, not elsewhere classified: Secondary | ICD-10-CM | POA: Diagnosis not present

## 2018-10-31 DIAGNOSIS — M9905 Segmental and somatic dysfunction of pelvic region: Secondary | ICD-10-CM | POA: Diagnosis not present

## 2018-11-05 DIAGNOSIS — M9904 Segmental and somatic dysfunction of sacral region: Secondary | ICD-10-CM | POA: Diagnosis not present

## 2018-11-05 DIAGNOSIS — G544 Lumbosacral root disorders, not elsewhere classified: Secondary | ICD-10-CM | POA: Diagnosis not present

## 2018-11-05 DIAGNOSIS — M9903 Segmental and somatic dysfunction of lumbar region: Secondary | ICD-10-CM | POA: Diagnosis not present

## 2018-11-05 DIAGNOSIS — M9905 Segmental and somatic dysfunction of pelvic region: Secondary | ICD-10-CM | POA: Diagnosis not present

## 2018-11-06 DIAGNOSIS — M9902 Segmental and somatic dysfunction of thoracic region: Secondary | ICD-10-CM | POA: Diagnosis not present

## 2018-11-06 DIAGNOSIS — M9903 Segmental and somatic dysfunction of lumbar region: Secondary | ICD-10-CM | POA: Diagnosis not present

## 2018-11-06 DIAGNOSIS — M9901 Segmental and somatic dysfunction of cervical region: Secondary | ICD-10-CM | POA: Diagnosis not present

## 2018-11-06 DIAGNOSIS — G542 Cervical root disorders, not elsewhere classified: Secondary | ICD-10-CM | POA: Diagnosis not present

## 2018-11-07 DIAGNOSIS — M9904 Segmental and somatic dysfunction of sacral region: Secondary | ICD-10-CM | POA: Diagnosis not present

## 2018-11-07 DIAGNOSIS — G544 Lumbosacral root disorders, not elsewhere classified: Secondary | ICD-10-CM | POA: Diagnosis not present

## 2018-11-07 DIAGNOSIS — M9903 Segmental and somatic dysfunction of lumbar region: Secondary | ICD-10-CM | POA: Diagnosis not present

## 2018-11-07 DIAGNOSIS — M9905 Segmental and somatic dysfunction of pelvic region: Secondary | ICD-10-CM | POA: Diagnosis not present

## 2018-11-13 DIAGNOSIS — G544 Lumbosacral root disorders, not elsewhere classified: Secondary | ICD-10-CM | POA: Diagnosis not present

## 2018-11-13 DIAGNOSIS — M9903 Segmental and somatic dysfunction of lumbar region: Secondary | ICD-10-CM | POA: Diagnosis not present

## 2018-11-13 DIAGNOSIS — M9905 Segmental and somatic dysfunction of pelvic region: Secondary | ICD-10-CM | POA: Diagnosis not present

## 2018-11-13 DIAGNOSIS — M9904 Segmental and somatic dysfunction of sacral region: Secondary | ICD-10-CM | POA: Diagnosis not present

## 2018-11-14 DIAGNOSIS — M9903 Segmental and somatic dysfunction of lumbar region: Secondary | ICD-10-CM | POA: Diagnosis not present

## 2018-11-14 DIAGNOSIS — G544 Lumbosacral root disorders, not elsewhere classified: Secondary | ICD-10-CM | POA: Diagnosis not present

## 2018-11-14 DIAGNOSIS — M9905 Segmental and somatic dysfunction of pelvic region: Secondary | ICD-10-CM | POA: Diagnosis not present

## 2018-11-14 DIAGNOSIS — M9904 Segmental and somatic dysfunction of sacral region: Secondary | ICD-10-CM | POA: Diagnosis not present

## 2018-11-19 ENCOUNTER — Other Ambulatory Visit: Payer: Self-pay

## 2018-11-19 DIAGNOSIS — G542 Cervical root disorders, not elsewhere classified: Secondary | ICD-10-CM | POA: Diagnosis not present

## 2018-11-19 DIAGNOSIS — M9902 Segmental and somatic dysfunction of thoracic region: Secondary | ICD-10-CM | POA: Diagnosis not present

## 2018-11-19 DIAGNOSIS — M9901 Segmental and somatic dysfunction of cervical region: Secondary | ICD-10-CM | POA: Diagnosis not present

## 2018-11-19 DIAGNOSIS — M9903 Segmental and somatic dysfunction of lumbar region: Secondary | ICD-10-CM | POA: Diagnosis not present

## 2018-11-20 ENCOUNTER — Other Ambulatory Visit: Payer: Self-pay

## 2018-11-20 ENCOUNTER — Encounter: Payer: Self-pay | Admitting: Family Medicine

## 2018-11-20 ENCOUNTER — Ambulatory Visit (INDEPENDENT_AMBULATORY_CARE_PROVIDER_SITE_OTHER): Payer: Medicare Other | Admitting: Family Medicine

## 2018-11-20 ENCOUNTER — Other Ambulatory Visit: Payer: Self-pay | Admitting: Family Medicine

## 2018-11-20 VITALS — BP 120/80 | HR 78 | Temp 97.3°F | Ht 70.0 in | Wt 222.2 lb

## 2018-11-20 DIAGNOSIS — Z0000 Encounter for general adult medical examination without abnormal findings: Secondary | ICD-10-CM

## 2018-11-20 DIAGNOSIS — E119 Type 2 diabetes mellitus without complications: Secondary | ICD-10-CM | POA: Diagnosis not present

## 2018-11-20 DIAGNOSIS — M9903 Segmental and somatic dysfunction of lumbar region: Secondary | ICD-10-CM | POA: Diagnosis not present

## 2018-11-20 DIAGNOSIS — G542 Cervical root disorders, not elsewhere classified: Secondary | ICD-10-CM | POA: Diagnosis not present

## 2018-11-20 DIAGNOSIS — E876 Hypokalemia: Secondary | ICD-10-CM

## 2018-11-20 DIAGNOSIS — M9901 Segmental and somatic dysfunction of cervical region: Secondary | ICD-10-CM | POA: Diagnosis not present

## 2018-11-20 DIAGNOSIS — M9902 Segmental and somatic dysfunction of thoracic region: Secondary | ICD-10-CM | POA: Diagnosis not present

## 2018-11-20 LAB — COMPREHENSIVE METABOLIC PANEL
ALT: 41 U/L (ref 0–53)
AST: 36 U/L (ref 0–37)
Albumin: 3.9 g/dL (ref 3.5–5.2)
Alkaline Phosphatase: 47 U/L (ref 39–117)
BUN: 16 mg/dL (ref 6–23)
CO2: 30 mEq/L (ref 19–32)
Calcium: 9.7 mg/dL (ref 8.4–10.5)
Chloride: 102 mEq/L (ref 96–112)
Creatinine, Ser: 1.22 mg/dL (ref 0.40–1.50)
GFR: 71.35 mL/min (ref >60.00)
Glucose, Bld: 144 mg/dL — ABNORMAL HIGH (ref 70–99)
Potassium: 3.4 mEq/L — ABNORMAL LOW (ref 3.5–5.1)
Sodium: 141 mEq/L (ref 135–145)
Total Bilirubin: 1.5 mg/dL — ABNORMAL HIGH (ref 0.2–1.2)
Total Protein: 7 g/dL (ref 6.0–8.3)

## 2018-11-20 LAB — CBC
HCT: 40.8 % (ref 39.0–52.0)
Hemoglobin: 14 g/dL (ref 13.0–17.0)
MCHC: 34.3 g/dL (ref 30.0–36.0)
MCV: 88.1 fl (ref 78.0–100.0)
Platelets: 182 10*3/uL (ref 150.0–400.0)
RBC: 4.64 Mil/uL (ref 4.22–5.81)
RDW: 14 % (ref 11.5–15.5)
WBC: 5.6 10*3/uL (ref 4.0–10.5)

## 2018-11-20 LAB — LIPID PANEL
Cholesterol: 254 mg/dL — ABNORMAL HIGH (ref 0–200)
HDL: 54.3 mg/dL (ref >39.00)
LDL Cholesterol: 172 mg/dL — ABNORMAL HIGH (ref 0–99)
NonHDL: 199.98
Total CHOL/HDL Ratio: 5
Triglycerides: 139 mg/dL (ref 0.0–149.0)
VLDL: 27.8 mg/dL (ref 0.0–40.0)

## 2018-11-20 LAB — MICROALBUMIN / CREATININE URINE RATIO
Creatinine,U: 128.8 mg/dL
Microalb Creat Ratio: 0.5 mg/g (ref 0.0–30.0)
Microalb, Ur: <0.7 mg/dL (ref 0.0–1.9)

## 2018-11-20 LAB — HEMOGLOBIN A1C: Hgb A1c MFr Bld: 7.6 % — ABNORMAL HIGH (ref 4.6–6.5)

## 2018-11-20 NOTE — Patient Instructions (Addendum)
Give [place] 2-3 business days to get the results of your labs back.   Keep the diet clean and stay active.  Call your eye provider for an appointment.   The new Shingrix vaccine (for shingles) is a 2 shot series. It can make people feel low energy, achy and almost like they have the flu for 48 hours after injection. Please plan accordingly when deciding on when to get this shot. Call your pharmacy to get this. The second shot of the series is less severe regarding the side effects, but it still lasts 48 hours.   Let me know if you change your mind about the flu shot.   Let us know if you need anything.

## 2018-11-20 NOTE — Progress Notes (Signed)
Chief Complaint  Patient presents with  . Annual Exam    Well Male Maxwell Maxwell is here for a complete physical.   His last physical was >1 year ago.  Current diet: in general, a "pretty good" diet.   Current exercise: walking Weight trend: stable Daytime fatigue? No. Seat belt? Yes.    Health maintenance Shingrix- No CCCS- Yes Tetanus- Yes Pneumonia vaccine- Yes  Past Medical History:  Diagnosis Date  . DDD (degenerative disc disease), lumbar   . Diabetes (HCC)   . Hyperlipidemia   . Hypertension   . Refuses treatment 11/10/2017   statin     Past Surgical History:  Procedure Laterality Date  . HYDROCELE EXCISION / REPAIR    . KIDNEY STONE SURGERY  2013  . TOOTH EXTRACTION     Full Dentures    Medications  Current Outpatient Medications on File Prior to Visit  Medication Sig Dispense Refill  . hydrochlorothiazide (HYDRODIURIL) 25 MG tablet Take 1 tablet (25 mg total) by mouth daily. 90 tablet 1  . losartan (COZAAR) 100 MG tablet Take 1 tablet (100 mg total) by mouth daily. 90 tablet 2  . Magnesium 250 MG TABS Take 1 tablet by mouth daily.     Allergies Allergies  Allergen Reactions  . Metformin And Related Rash    Caused Exacerbation in Psoriasis [skin dry & peeled]    Family History Family History  Problem Relation Age of Onset  . Hypertension Mother        Living  . Arthritis Mother   . Stroke Father 91       Deceased  . Alcohol abuse Father   . Heart disease Sister        #1  . Hypertension Sister        #5  . Diabetes Brother        Deceased  . Stroke Brother        #2  . Alcohol abuse Brother        #2  . Throat cancer Brother        #3  . Arthritis/Rheumatoid Daughter   . Healthy Daughter        #2  . Alcohol abuse Son   . Heart attack Neg Hx     Review of Systems: Constitutional:  no fevers or chills Eye:  no recent significant change in vision Ear/Nose/Mouth/Throat:  Ears:  no recent hearing loss Nose/Mouth/Throat:  no  complaints of nasal congestion or sore throat Cardiovascular:  no chest pain, no palpitations Respiratory:  no cough and no shortness of breath Gastrointestinal:  no abdominal pain, no change in bowel habits GU:  Male: negative for dysuria, frequency, and incontinence and negative for prostate symptoms Musculoskeletal/Extremities:  no pain, redness, or swelling of the joints Integumentary (Skin):  no abnormal skin lesions reported Neurologic:  no headaches, Endocrine:  No unexpected weight changes Hematologic/Lymphatic:  no areas of easy bruising  Exam BP 120/80 (BP Location: Left Arm, Patient Position: Sitting, Cuff Size: Large)   Pulse 78   Temp (!) 97.3 F (36.3 C) (Temporal)   Ht 5\' 10"  (1.778 m)   Wt 222 lb 4 oz (100.8 kg)   SpO2 94%   BMI 31.89 kg/m  General:  well developed, well nourished, in no apparent distress Skin:  no significant moles, warts, or growths Head:  no masses, lesions, or tenderness Eyes:  pupils equal and round, sclera anicteric without injection Ears:  canals without lesions, TMs shiny without retraction, no  obvious effusion, no erythema Nose:  nares patent, septum midline, mucosa normal Throat/Pharynx:  lips and gingiva without lesion; tongue and uvula midline; non-inflamed pharynx; no exudates or postnasal drainage Neck: neck supple without adenopathy, thyromegaly, or masses Lungs:  clear to auscultation, breath sounds equal bilaterally, no respiratory distress Cardio:  regular rate and rhythm, no LE edema or bruits Abdomen:  abdomen soft, nontender; bowel sounds normal; no masses or organomegaly Rectal: Deferred Musculoskeletal:  symmetrical muscle groups noted without atrophy or deformity Extremities:  no clubbing, cyanosis, or edema, no deformities, no skin discoloration Neuro:  gait normal; deep tendon reflexes normal and symmetric Psych: well oriented with normal range of affect and appropriate judgment/insight  Assessment and Plan  No  diagnosis found.   Well 68 y.o. male. Counseled on diet and exercise. Counseled on risks and benefits of prostate cancer screening w PSA, he agreed to forego screening.  Other orders as above. Follow up in 6 mo.  The patient voiced understanding and agreement to the plan.  [place], DO 11/20/18 9:04 AM

## 2018-11-21 DIAGNOSIS — M9905 Segmental and somatic dysfunction of pelvic region: Secondary | ICD-10-CM | POA: Diagnosis not present

## 2018-11-21 DIAGNOSIS — G541 Lumbosacral plexus disorders: Secondary | ICD-10-CM | POA: Diagnosis not present

## 2018-11-21 DIAGNOSIS — M9901 Segmental and somatic dysfunction of cervical region: Secondary | ICD-10-CM | POA: Diagnosis not present

## 2018-11-21 DIAGNOSIS — M9904 Segmental and somatic dysfunction of sacral region: Secondary | ICD-10-CM | POA: Diagnosis not present

## 2018-11-21 DIAGNOSIS — M9903 Segmental and somatic dysfunction of lumbar region: Secondary | ICD-10-CM | POA: Diagnosis not present

## 2018-11-26 DIAGNOSIS — M9901 Segmental and somatic dysfunction of cervical region: Secondary | ICD-10-CM | POA: Diagnosis not present

## 2018-11-26 DIAGNOSIS — G542 Cervical root disorders, not elsewhere classified: Secondary | ICD-10-CM | POA: Diagnosis not present

## 2018-11-26 DIAGNOSIS — M9903 Segmental and somatic dysfunction of lumbar region: Secondary | ICD-10-CM | POA: Diagnosis not present

## 2018-11-26 DIAGNOSIS — M9902 Segmental and somatic dysfunction of thoracic region: Secondary | ICD-10-CM | POA: Diagnosis not present

## 2018-11-27 ENCOUNTER — Other Ambulatory Visit (INDEPENDENT_AMBULATORY_CARE_PROVIDER_SITE_OTHER): Payer: Medicare Other

## 2018-11-27 ENCOUNTER — Other Ambulatory Visit: Payer: Self-pay

## 2018-11-27 DIAGNOSIS — M9904 Segmental and somatic dysfunction of sacral region: Secondary | ICD-10-CM | POA: Diagnosis not present

## 2018-11-27 DIAGNOSIS — E876 Hypokalemia: Secondary | ICD-10-CM

## 2018-11-27 DIAGNOSIS — M9903 Segmental and somatic dysfunction of lumbar region: Secondary | ICD-10-CM | POA: Diagnosis not present

## 2018-11-27 DIAGNOSIS — G544 Lumbosacral root disorders, not elsewhere classified: Secondary | ICD-10-CM | POA: Diagnosis not present

## 2018-11-27 DIAGNOSIS — M9905 Segmental and somatic dysfunction of pelvic region: Secondary | ICD-10-CM | POA: Diagnosis not present

## 2018-11-27 LAB — BASIC METABOLIC PANEL
BUN: 14 mg/dL (ref 6–23)
CO2: 31 mEq/L (ref 19–32)
Calcium: 9.8 mg/dL (ref 8.4–10.5)
Chloride: 99 mEq/L (ref 96–112)
Creatinine, Ser: 1.18 mg/dL (ref 0.40–1.50)
GFR: 74.14 mL/min (ref >60.00)
Glucose, Bld: 156 mg/dL — ABNORMAL HIGH (ref 70–99)
Potassium: 3.4 mEq/L — ABNORMAL LOW (ref 3.5–5.1)
Sodium: 137 mEq/L (ref 135–145)

## 2018-11-28 ENCOUNTER — Other Ambulatory Visit: Payer: Self-pay | Admitting: Family Medicine

## 2018-11-28 DIAGNOSIS — G544 Lumbosacral root disorders, not elsewhere classified: Secondary | ICD-10-CM | POA: Diagnosis not present

## 2018-11-28 DIAGNOSIS — M9903 Segmental and somatic dysfunction of lumbar region: Secondary | ICD-10-CM | POA: Diagnosis not present

## 2018-11-28 DIAGNOSIS — E876 Hypokalemia: Secondary | ICD-10-CM

## 2018-11-28 DIAGNOSIS — M9905 Segmental and somatic dysfunction of pelvic region: Secondary | ICD-10-CM | POA: Diagnosis not present

## 2018-11-28 DIAGNOSIS — M9904 Segmental and somatic dysfunction of sacral region: Secondary | ICD-10-CM | POA: Diagnosis not present

## 2018-11-28 MED ORDER — POTASSIUM CHLORIDE CRYS ER 10 MEQ PO TBCR: 10.0000 meq | EXTENDED_RELEASE_TABLET | Freq: Every day | ORAL | 2 refills | Status: DC

## 2018-12-03 DIAGNOSIS — M9905 Segmental and somatic dysfunction of pelvic region: Secondary | ICD-10-CM | POA: Diagnosis not present

## 2018-12-03 DIAGNOSIS — M9904 Segmental and somatic dysfunction of sacral region: Secondary | ICD-10-CM | POA: Diagnosis not present

## 2018-12-03 DIAGNOSIS — M9903 Segmental and somatic dysfunction of lumbar region: Secondary | ICD-10-CM | POA: Diagnosis not present

## 2018-12-03 DIAGNOSIS — G544 Lumbosacral root disorders, not elsewhere classified: Secondary | ICD-10-CM | POA: Diagnosis not present

## 2018-12-04 DIAGNOSIS — M9902 Segmental and somatic dysfunction of thoracic region: Secondary | ICD-10-CM | POA: Diagnosis not present

## 2018-12-04 DIAGNOSIS — M9901 Segmental and somatic dysfunction of cervical region: Secondary | ICD-10-CM | POA: Diagnosis not present

## 2018-12-04 DIAGNOSIS — G542 Cervical root disorders, not elsewhere classified: Secondary | ICD-10-CM | POA: Diagnosis not present

## 2018-12-04 DIAGNOSIS — M9903 Segmental and somatic dysfunction of lumbar region: Secondary | ICD-10-CM | POA: Diagnosis not present

## 2018-12-06 DIAGNOSIS — G544 Lumbosacral root disorders, not elsewhere classified: Secondary | ICD-10-CM | POA: Diagnosis not present

## 2018-12-06 DIAGNOSIS — M9905 Segmental and somatic dysfunction of pelvic region: Secondary | ICD-10-CM | POA: Diagnosis not present

## 2018-12-06 DIAGNOSIS — M9904 Segmental and somatic dysfunction of sacral region: Secondary | ICD-10-CM | POA: Diagnosis not present

## 2018-12-06 DIAGNOSIS — M9903 Segmental and somatic dysfunction of lumbar region: Secondary | ICD-10-CM | POA: Diagnosis not present

## 2018-12-10 DIAGNOSIS — G544 Lumbosacral root disorders, not elsewhere classified: Secondary | ICD-10-CM | POA: Diagnosis not present

## 2018-12-10 DIAGNOSIS — M9905 Segmental and somatic dysfunction of pelvic region: Secondary | ICD-10-CM | POA: Diagnosis not present

## 2018-12-10 DIAGNOSIS — M9903 Segmental and somatic dysfunction of lumbar region: Secondary | ICD-10-CM | POA: Diagnosis not present

## 2018-12-10 DIAGNOSIS — M9904 Segmental and somatic dysfunction of sacral region: Secondary | ICD-10-CM | POA: Diagnosis not present

## 2018-12-11 DIAGNOSIS — M9905 Segmental and somatic dysfunction of pelvic region: Secondary | ICD-10-CM | POA: Diagnosis not present

## 2018-12-11 DIAGNOSIS — M9903 Segmental and somatic dysfunction of lumbar region: Secondary | ICD-10-CM | POA: Diagnosis not present

## 2018-12-11 DIAGNOSIS — G544 Lumbosacral root disorders, not elsewhere classified: Secondary | ICD-10-CM | POA: Diagnosis not present

## 2018-12-11 DIAGNOSIS — M9904 Segmental and somatic dysfunction of sacral region: Secondary | ICD-10-CM | POA: Diagnosis not present

## 2018-12-12 DIAGNOSIS — M9902 Segmental and somatic dysfunction of thoracic region: Secondary | ICD-10-CM | POA: Diagnosis not present

## 2018-12-12 DIAGNOSIS — M9903 Segmental and somatic dysfunction of lumbar region: Secondary | ICD-10-CM | POA: Diagnosis not present

## 2018-12-12 DIAGNOSIS — G542 Cervical root disorders, not elsewhere classified: Secondary | ICD-10-CM | POA: Diagnosis not present

## 2018-12-12 DIAGNOSIS — M9901 Segmental and somatic dysfunction of cervical region: Secondary | ICD-10-CM | POA: Diagnosis not present

## 2018-12-13 ENCOUNTER — Other Ambulatory Visit: Payer: Self-pay

## 2018-12-13 ENCOUNTER — Other Ambulatory Visit (INDEPENDENT_AMBULATORY_CARE_PROVIDER_SITE_OTHER): Payer: Medicare Other

## 2018-12-13 DIAGNOSIS — E876 Hypokalemia: Secondary | ICD-10-CM | POA: Diagnosis not present

## 2018-12-13 LAB — BASIC METABOLIC PANEL
BUN: 15 mg/dL (ref 6–23)
CO2: 28 mEq/L (ref 19–32)
Calcium: 9.8 mg/dL (ref 8.4–10.5)
Chloride: 101 mEq/L (ref 96–112)
Creatinine, Ser: 1.12 mg/dL (ref 0.40–1.50)
GFR: 78.74 mL/min (ref >60.00)
Glucose, Bld: 150 mg/dL — ABNORMAL HIGH (ref 70–99)
Potassium: 3.6 mEq/L (ref 3.5–5.1)
Sodium: 138 mEq/L (ref 135–145)

## 2018-12-13 LAB — MAGNESIUM: Magnesium: 1.6 mg/dL (ref 1.5–2.5)

## 2018-12-17 DIAGNOSIS — M9903 Segmental and somatic dysfunction of lumbar region: Secondary | ICD-10-CM | POA: Diagnosis not present

## 2018-12-17 DIAGNOSIS — M9902 Segmental and somatic dysfunction of thoracic region: Secondary | ICD-10-CM | POA: Diagnosis not present

## 2018-12-17 DIAGNOSIS — M9901 Segmental and somatic dysfunction of cervical region: Secondary | ICD-10-CM | POA: Diagnosis not present

## 2018-12-17 DIAGNOSIS — G542 Cervical root disorders, not elsewhere classified: Secondary | ICD-10-CM | POA: Diagnosis not present

## 2018-12-18 DIAGNOSIS — M9905 Segmental and somatic dysfunction of pelvic region: Secondary | ICD-10-CM | POA: Diagnosis not present

## 2018-12-18 DIAGNOSIS — M9904 Segmental and somatic dysfunction of sacral region: Secondary | ICD-10-CM | POA: Diagnosis not present

## 2018-12-18 DIAGNOSIS — M9903 Segmental and somatic dysfunction of lumbar region: Secondary | ICD-10-CM | POA: Diagnosis not present

## 2018-12-18 DIAGNOSIS — G544 Lumbosacral root disorders, not elsewhere classified: Secondary | ICD-10-CM | POA: Diagnosis not present

## 2018-12-19 DIAGNOSIS — M9902 Segmental and somatic dysfunction of thoracic region: Secondary | ICD-10-CM | POA: Diagnosis not present

## 2018-12-19 DIAGNOSIS — G542 Cervical root disorders, not elsewhere classified: Secondary | ICD-10-CM | POA: Diagnosis not present

## 2018-12-19 DIAGNOSIS — M9901 Segmental and somatic dysfunction of cervical region: Secondary | ICD-10-CM | POA: Diagnosis not present

## 2018-12-19 DIAGNOSIS — M9903 Segmental and somatic dysfunction of lumbar region: Secondary | ICD-10-CM | POA: Diagnosis not present

## 2018-12-24 DIAGNOSIS — M9904 Segmental and somatic dysfunction of sacral region: Secondary | ICD-10-CM | POA: Diagnosis not present

## 2018-12-24 DIAGNOSIS — M9905 Segmental and somatic dysfunction of pelvic region: Secondary | ICD-10-CM | POA: Diagnosis not present

## 2018-12-24 DIAGNOSIS — G544 Lumbosacral root disorders, not elsewhere classified: Secondary | ICD-10-CM | POA: Diagnosis not present

## 2018-12-24 DIAGNOSIS — M9903 Segmental and somatic dysfunction of lumbar region: Secondary | ICD-10-CM | POA: Diagnosis not present

## 2018-12-25 DIAGNOSIS — M9903 Segmental and somatic dysfunction of lumbar region: Secondary | ICD-10-CM | POA: Diagnosis not present

## 2018-12-25 DIAGNOSIS — M9905 Segmental and somatic dysfunction of pelvic region: Secondary | ICD-10-CM | POA: Diagnosis not present

## 2018-12-25 DIAGNOSIS — M9904 Segmental and somatic dysfunction of sacral region: Secondary | ICD-10-CM | POA: Diagnosis not present

## 2018-12-25 DIAGNOSIS — G544 Lumbosacral root disorders, not elsewhere classified: Secondary | ICD-10-CM | POA: Diagnosis not present

## 2018-12-26 DIAGNOSIS — M9903 Segmental and somatic dysfunction of lumbar region: Secondary | ICD-10-CM | POA: Diagnosis not present

## 2018-12-26 DIAGNOSIS — M9904 Segmental and somatic dysfunction of sacral region: Secondary | ICD-10-CM | POA: Diagnosis not present

## 2018-12-26 DIAGNOSIS — G544 Lumbosacral root disorders, not elsewhere classified: Secondary | ICD-10-CM | POA: Diagnosis not present

## 2018-12-26 DIAGNOSIS — M9905 Segmental and somatic dysfunction of pelvic region: Secondary | ICD-10-CM | POA: Diagnosis not present

## 2018-12-31 DIAGNOSIS — M9904 Segmental and somatic dysfunction of sacral region: Secondary | ICD-10-CM | POA: Diagnosis not present

## 2018-12-31 DIAGNOSIS — M9905 Segmental and somatic dysfunction of pelvic region: Secondary | ICD-10-CM | POA: Diagnosis not present

## 2018-12-31 DIAGNOSIS — G544 Lumbosacral root disorders, not elsewhere classified: Secondary | ICD-10-CM | POA: Diagnosis not present

## 2018-12-31 DIAGNOSIS — M9903 Segmental and somatic dysfunction of lumbar region: Secondary | ICD-10-CM | POA: Diagnosis not present

## 2019-01-08 DIAGNOSIS — G542 Cervical root disorders, not elsewhere classified: Secondary | ICD-10-CM | POA: Diagnosis not present

## 2019-01-08 DIAGNOSIS — M9903 Segmental and somatic dysfunction of lumbar region: Secondary | ICD-10-CM | POA: Diagnosis not present

## 2019-01-08 DIAGNOSIS — M9902 Segmental and somatic dysfunction of thoracic region: Secondary | ICD-10-CM | POA: Diagnosis not present

## 2019-01-08 DIAGNOSIS — M9901 Segmental and somatic dysfunction of cervical region: Secondary | ICD-10-CM | POA: Diagnosis not present

## 2019-01-09 DIAGNOSIS — M9905 Segmental and somatic dysfunction of pelvic region: Secondary | ICD-10-CM | POA: Diagnosis not present

## 2019-01-09 DIAGNOSIS — M9904 Segmental and somatic dysfunction of sacral region: Secondary | ICD-10-CM | POA: Diagnosis not present

## 2019-01-09 DIAGNOSIS — M9903 Segmental and somatic dysfunction of lumbar region: Secondary | ICD-10-CM | POA: Diagnosis not present

## 2019-01-09 DIAGNOSIS — G544 Lumbosacral root disorders, not elsewhere classified: Secondary | ICD-10-CM | POA: Diagnosis not present

## 2019-01-10 DIAGNOSIS — M9905 Segmental and somatic dysfunction of pelvic region: Secondary | ICD-10-CM | POA: Diagnosis not present

## 2019-01-10 DIAGNOSIS — G544 Lumbosacral root disorders, not elsewhere classified: Secondary | ICD-10-CM | POA: Diagnosis not present

## 2019-01-10 DIAGNOSIS — M9904 Segmental and somatic dysfunction of sacral region: Secondary | ICD-10-CM | POA: Diagnosis not present

## 2019-01-10 DIAGNOSIS — M9903 Segmental and somatic dysfunction of lumbar region: Secondary | ICD-10-CM | POA: Diagnosis not present

## 2019-01-22 DIAGNOSIS — M9905 Segmental and somatic dysfunction of pelvic region: Secondary | ICD-10-CM | POA: Diagnosis not present

## 2019-01-22 DIAGNOSIS — G544 Lumbosacral root disorders, not elsewhere classified: Secondary | ICD-10-CM | POA: Diagnosis not present

## 2019-01-22 DIAGNOSIS — M9903 Segmental and somatic dysfunction of lumbar region: Secondary | ICD-10-CM | POA: Diagnosis not present

## 2019-01-22 DIAGNOSIS — M9904 Segmental and somatic dysfunction of sacral region: Secondary | ICD-10-CM | POA: Diagnosis not present

## 2019-02-13 DIAGNOSIS — M9902 Segmental and somatic dysfunction of thoracic region: Secondary | ICD-10-CM | POA: Diagnosis not present

## 2019-02-13 DIAGNOSIS — M9903 Segmental and somatic dysfunction of lumbar region: Secondary | ICD-10-CM | POA: Diagnosis not present

## 2019-02-13 DIAGNOSIS — M9901 Segmental and somatic dysfunction of cervical region: Secondary | ICD-10-CM | POA: Diagnosis not present

## 2019-02-13 DIAGNOSIS — G542 Cervical root disorders, not elsewhere classified: Secondary | ICD-10-CM | POA: Diagnosis not present

## 2019-02-14 DIAGNOSIS — G544 Lumbosacral root disorders, not elsewhere classified: Secondary | ICD-10-CM | POA: Diagnosis not present

## 2019-02-14 DIAGNOSIS — M9903 Segmental and somatic dysfunction of lumbar region: Secondary | ICD-10-CM | POA: Diagnosis not present

## 2019-02-14 DIAGNOSIS — M9904 Segmental and somatic dysfunction of sacral region: Secondary | ICD-10-CM | POA: Diagnosis not present

## 2019-02-14 DIAGNOSIS — M9905 Segmental and somatic dysfunction of pelvic region: Secondary | ICD-10-CM | POA: Diagnosis not present

## 2019-02-18 DIAGNOSIS — M9904 Segmental and somatic dysfunction of sacral region: Secondary | ICD-10-CM | POA: Diagnosis not present

## 2019-02-18 DIAGNOSIS — G544 Lumbosacral root disorders, not elsewhere classified: Secondary | ICD-10-CM | POA: Diagnosis not present

## 2019-02-18 DIAGNOSIS — M9903 Segmental and somatic dysfunction of lumbar region: Secondary | ICD-10-CM | POA: Diagnosis not present

## 2019-02-18 DIAGNOSIS — M9905 Segmental and somatic dysfunction of pelvic region: Secondary | ICD-10-CM | POA: Diagnosis not present

## 2019-02-19 DIAGNOSIS — M9901 Segmental and somatic dysfunction of cervical region: Secondary | ICD-10-CM | POA: Diagnosis not present

## 2019-02-19 DIAGNOSIS — M9903 Segmental and somatic dysfunction of lumbar region: Secondary | ICD-10-CM | POA: Diagnosis not present

## 2019-02-19 DIAGNOSIS — M9902 Segmental and somatic dysfunction of thoracic region: Secondary | ICD-10-CM | POA: Diagnosis not present

## 2019-02-19 DIAGNOSIS — G542 Cervical root disorders, not elsewhere classified: Secondary | ICD-10-CM | POA: Diagnosis not present

## 2019-02-20 DIAGNOSIS — M9903 Segmental and somatic dysfunction of lumbar region: Secondary | ICD-10-CM | POA: Diagnosis not present

## 2019-02-20 DIAGNOSIS — M9902 Segmental and somatic dysfunction of thoracic region: Secondary | ICD-10-CM | POA: Diagnosis not present

## 2019-02-20 DIAGNOSIS — G542 Cervical root disorders, not elsewhere classified: Secondary | ICD-10-CM | POA: Diagnosis not present

## 2019-02-20 DIAGNOSIS — M9901 Segmental and somatic dysfunction of cervical region: Secondary | ICD-10-CM | POA: Diagnosis not present

## 2019-02-25 DIAGNOSIS — M9903 Segmental and somatic dysfunction of lumbar region: Secondary | ICD-10-CM | POA: Diagnosis not present

## 2019-02-25 DIAGNOSIS — G542 Cervical root disorders, not elsewhere classified: Secondary | ICD-10-CM | POA: Diagnosis not present

## 2019-02-25 DIAGNOSIS — M9902 Segmental and somatic dysfunction of thoracic region: Secondary | ICD-10-CM | POA: Diagnosis not present

## 2019-02-25 DIAGNOSIS — M9901 Segmental and somatic dysfunction of cervical region: Secondary | ICD-10-CM | POA: Diagnosis not present

## 2019-02-27 DIAGNOSIS — M9904 Segmental and somatic dysfunction of sacral region: Secondary | ICD-10-CM | POA: Diagnosis not present

## 2019-02-27 DIAGNOSIS — M9905 Segmental and somatic dysfunction of pelvic region: Secondary | ICD-10-CM | POA: Diagnosis not present

## 2019-02-27 DIAGNOSIS — G544 Lumbosacral root disorders, not elsewhere classified: Secondary | ICD-10-CM | POA: Diagnosis not present

## 2019-02-27 DIAGNOSIS — M9903 Segmental and somatic dysfunction of lumbar region: Secondary | ICD-10-CM | POA: Diagnosis not present

## 2019-03-05 DIAGNOSIS — M9904 Segmental and somatic dysfunction of sacral region: Secondary | ICD-10-CM | POA: Diagnosis not present

## 2019-03-05 DIAGNOSIS — M9903 Segmental and somatic dysfunction of lumbar region: Secondary | ICD-10-CM | POA: Diagnosis not present

## 2019-03-05 DIAGNOSIS — M9905 Segmental and somatic dysfunction of pelvic region: Secondary | ICD-10-CM | POA: Diagnosis not present

## 2019-03-05 DIAGNOSIS — G544 Lumbosacral root disorders, not elsewhere classified: Secondary | ICD-10-CM | POA: Diagnosis not present

## 2019-03-06 DIAGNOSIS — G544 Lumbosacral root disorders, not elsewhere classified: Secondary | ICD-10-CM | POA: Diagnosis not present

## 2019-03-06 DIAGNOSIS — M9903 Segmental and somatic dysfunction of lumbar region: Secondary | ICD-10-CM | POA: Diagnosis not present

## 2019-03-06 DIAGNOSIS — M9905 Segmental and somatic dysfunction of pelvic region: Secondary | ICD-10-CM | POA: Diagnosis not present

## 2019-03-06 DIAGNOSIS — M9904 Segmental and somatic dysfunction of sacral region: Secondary | ICD-10-CM | POA: Diagnosis not present

## 2019-03-11 DIAGNOSIS — M9903 Segmental and somatic dysfunction of lumbar region: Secondary | ICD-10-CM | POA: Diagnosis not present

## 2019-03-11 DIAGNOSIS — M9904 Segmental and somatic dysfunction of sacral region: Secondary | ICD-10-CM | POA: Diagnosis not present

## 2019-03-11 DIAGNOSIS — G544 Lumbosacral root disorders, not elsewhere classified: Secondary | ICD-10-CM | POA: Diagnosis not present

## 2019-03-11 DIAGNOSIS — M9905 Segmental and somatic dysfunction of pelvic region: Secondary | ICD-10-CM | POA: Diagnosis not present

## 2019-03-12 DIAGNOSIS — M9904 Segmental and somatic dysfunction of sacral region: Secondary | ICD-10-CM | POA: Diagnosis not present

## 2019-03-12 DIAGNOSIS — M9903 Segmental and somatic dysfunction of lumbar region: Secondary | ICD-10-CM | POA: Diagnosis not present

## 2019-03-12 DIAGNOSIS — G544 Lumbosacral root disorders, not elsewhere classified: Secondary | ICD-10-CM | POA: Diagnosis not present

## 2019-03-12 DIAGNOSIS — M9905 Segmental and somatic dysfunction of pelvic region: Secondary | ICD-10-CM | POA: Diagnosis not present

## 2019-03-13 DIAGNOSIS — M9903 Segmental and somatic dysfunction of lumbar region: Secondary | ICD-10-CM | POA: Diagnosis not present

## 2019-03-13 DIAGNOSIS — M9905 Segmental and somatic dysfunction of pelvic region: Secondary | ICD-10-CM | POA: Diagnosis not present

## 2019-03-13 DIAGNOSIS — G544 Lumbosacral root disorders, not elsewhere classified: Secondary | ICD-10-CM | POA: Diagnosis not present

## 2019-03-13 DIAGNOSIS — M9904 Segmental and somatic dysfunction of sacral region: Secondary | ICD-10-CM | POA: Diagnosis not present

## 2019-03-19 DIAGNOSIS — M9904 Segmental and somatic dysfunction of sacral region: Secondary | ICD-10-CM | POA: Diagnosis not present

## 2019-03-19 DIAGNOSIS — M9903 Segmental and somatic dysfunction of lumbar region: Secondary | ICD-10-CM | POA: Diagnosis not present

## 2019-03-19 DIAGNOSIS — G544 Lumbosacral root disorders, not elsewhere classified: Secondary | ICD-10-CM | POA: Diagnosis not present

## 2019-03-19 DIAGNOSIS — M9905 Segmental and somatic dysfunction of pelvic region: Secondary | ICD-10-CM | POA: Diagnosis not present

## 2019-03-20 ENCOUNTER — Telehealth: Payer: Self-pay | Admitting: Family Medicine

## 2019-03-20 DIAGNOSIS — I10 Essential (primary) hypertension: Secondary | ICD-10-CM

## 2019-03-20 DIAGNOSIS — G544 Lumbosacral root disorders, not elsewhere classified: Secondary | ICD-10-CM | POA: Diagnosis not present

## 2019-03-20 DIAGNOSIS — M9905 Segmental and somatic dysfunction of pelvic region: Secondary | ICD-10-CM | POA: Diagnosis not present

## 2019-03-20 DIAGNOSIS — M9904 Segmental and somatic dysfunction of sacral region: Secondary | ICD-10-CM | POA: Diagnosis not present

## 2019-03-20 DIAGNOSIS — M9903 Segmental and somatic dysfunction of lumbar region: Secondary | ICD-10-CM | POA: Diagnosis not present

## 2019-03-20 MED ORDER — HYDROCHLOROTHIAZIDE 25 MG PO TABS: 25.0000 mg | ORAL_TABLET | Freq: Every day | ORAL | 1 refills | Status: DC

## 2019-03-20 MED ORDER — LOSARTAN POTASSIUM 100 MG PO TABS: 100.0000 mg | ORAL_TABLET | Freq: Every day | ORAL | 1 refills | Status: DC

## 2019-03-20 NOTE — Telephone Encounter (Signed)
Sent in refills/called the patient informed refills done.

## 2019-03-20 NOTE — Telephone Encounter (Signed)
Medication:  losartan (COZAAR) 100 MG tablet  hydrochlorothiazide (HYDRODIURIL) 25 MG tablet [[id number]]   Has the patient contacted their pharmacy? No.  Preferred Pharmacy (with phone number or street name):   [id number] - [place], [place] - [address]  [address], [place] [address]  Phone:  [phone] Fax:  [phone]    Agent: Please be advised that RX refills may take up to 3 business days. We ask that you follow-up with your pharmacy.

## 2019-03-25 DIAGNOSIS — M9904 Segmental and somatic dysfunction of sacral region: Secondary | ICD-10-CM | POA: Diagnosis not present

## 2019-03-25 DIAGNOSIS — G544 Lumbosacral root disorders, not elsewhere classified: Secondary | ICD-10-CM | POA: Diagnosis not present

## 2019-03-25 DIAGNOSIS — M9903 Segmental and somatic dysfunction of lumbar region: Secondary | ICD-10-CM | POA: Diagnosis not present

## 2019-03-25 DIAGNOSIS — M9905 Segmental and somatic dysfunction of pelvic region: Secondary | ICD-10-CM | POA: Diagnosis not present

## 2019-03-26 DIAGNOSIS — G542 Cervical root disorders, not elsewhere classified: Secondary | ICD-10-CM | POA: Diagnosis not present

## 2019-03-26 DIAGNOSIS — M9903 Segmental and somatic dysfunction of lumbar region: Secondary | ICD-10-CM | POA: Diagnosis not present

## 2019-03-26 DIAGNOSIS — M9901 Segmental and somatic dysfunction of cervical region: Secondary | ICD-10-CM | POA: Diagnosis not present

## 2019-03-26 DIAGNOSIS — M9902 Segmental and somatic dysfunction of thoracic region: Secondary | ICD-10-CM | POA: Diagnosis not present

## 2019-03-27 DIAGNOSIS — M9903 Segmental and somatic dysfunction of lumbar region: Secondary | ICD-10-CM | POA: Diagnosis not present

## 2019-03-27 DIAGNOSIS — G542 Cervical root disorders, not elsewhere classified: Secondary | ICD-10-CM | POA: Diagnosis not present

## 2019-03-27 DIAGNOSIS — M9901 Segmental and somatic dysfunction of cervical region: Secondary | ICD-10-CM | POA: Diagnosis not present

## 2019-03-27 DIAGNOSIS — M9902 Segmental and somatic dysfunction of thoracic region: Secondary | ICD-10-CM | POA: Diagnosis not present

## 2019-04-01 DIAGNOSIS — M9902 Segmental and somatic dysfunction of thoracic region: Secondary | ICD-10-CM | POA: Diagnosis not present

## 2019-04-01 DIAGNOSIS — M9901 Segmental and somatic dysfunction of cervical region: Secondary | ICD-10-CM | POA: Diagnosis not present

## 2019-04-01 DIAGNOSIS — M9903 Segmental and somatic dysfunction of lumbar region: Secondary | ICD-10-CM | POA: Diagnosis not present

## 2019-04-01 DIAGNOSIS — G542 Cervical root disorders, not elsewhere classified: Secondary | ICD-10-CM | POA: Diagnosis not present

## 2019-04-02 DIAGNOSIS — G542 Cervical root disorders, not elsewhere classified: Secondary | ICD-10-CM | POA: Diagnosis not present

## 2019-04-02 DIAGNOSIS — M9903 Segmental and somatic dysfunction of lumbar region: Secondary | ICD-10-CM | POA: Diagnosis not present

## 2019-04-02 DIAGNOSIS — M9901 Segmental and somatic dysfunction of cervical region: Secondary | ICD-10-CM | POA: Diagnosis not present

## 2019-04-02 DIAGNOSIS — M9902 Segmental and somatic dysfunction of thoracic region: Secondary | ICD-10-CM | POA: Diagnosis not present

## 2019-04-03 DIAGNOSIS — M9902 Segmental and somatic dysfunction of thoracic region: Secondary | ICD-10-CM | POA: Diagnosis not present

## 2019-04-03 DIAGNOSIS — G542 Cervical root disorders, not elsewhere classified: Secondary | ICD-10-CM | POA: Diagnosis not present

## 2019-04-03 DIAGNOSIS — M9903 Segmental and somatic dysfunction of lumbar region: Secondary | ICD-10-CM | POA: Diagnosis not present

## 2019-04-03 DIAGNOSIS — M9901 Segmental and somatic dysfunction of cervical region: Secondary | ICD-10-CM | POA: Diagnosis not present

## 2019-04-09 DIAGNOSIS — M9901 Segmental and somatic dysfunction of cervical region: Secondary | ICD-10-CM | POA: Diagnosis not present

## 2019-04-09 DIAGNOSIS — M9903 Segmental and somatic dysfunction of lumbar region: Secondary | ICD-10-CM | POA: Diagnosis not present

## 2019-04-09 DIAGNOSIS — G542 Cervical root disorders, not elsewhere classified: Secondary | ICD-10-CM | POA: Diagnosis not present

## 2019-04-09 DIAGNOSIS — M9902 Segmental and somatic dysfunction of thoracic region: Secondary | ICD-10-CM | POA: Diagnosis not present

## 2019-04-10 DIAGNOSIS — M9904 Segmental and somatic dysfunction of sacral region: Secondary | ICD-10-CM | POA: Diagnosis not present

## 2019-04-10 DIAGNOSIS — M9903 Segmental and somatic dysfunction of lumbar region: Secondary | ICD-10-CM | POA: Diagnosis not present

## 2019-04-10 DIAGNOSIS — M9905 Segmental and somatic dysfunction of pelvic region: Secondary | ICD-10-CM | POA: Diagnosis not present

## 2019-04-10 DIAGNOSIS — G544 Lumbosacral root disorders, not elsewhere classified: Secondary | ICD-10-CM | POA: Diagnosis not present

## 2019-04-17 DIAGNOSIS — M9902 Segmental and somatic dysfunction of thoracic region: Secondary | ICD-10-CM | POA: Diagnosis not present

## 2019-04-17 DIAGNOSIS — M9901 Segmental and somatic dysfunction of cervical region: Secondary | ICD-10-CM | POA: Diagnosis not present

## 2019-04-17 DIAGNOSIS — G542 Cervical root disorders, not elsewhere classified: Secondary | ICD-10-CM | POA: Diagnosis not present

## 2019-04-17 DIAGNOSIS — M9903 Segmental and somatic dysfunction of lumbar region: Secondary | ICD-10-CM | POA: Diagnosis not present

## 2019-04-22 DIAGNOSIS — M9903 Segmental and somatic dysfunction of lumbar region: Secondary | ICD-10-CM | POA: Diagnosis not present

## 2019-04-22 DIAGNOSIS — G542 Cervical root disorders, not elsewhere classified: Secondary | ICD-10-CM | POA: Diagnosis not present

## 2019-04-22 DIAGNOSIS — M9902 Segmental and somatic dysfunction of thoracic region: Secondary | ICD-10-CM | POA: Diagnosis not present

## 2019-04-22 DIAGNOSIS — M9901 Segmental and somatic dysfunction of cervical region: Secondary | ICD-10-CM | POA: Diagnosis not present

## 2019-04-23 DIAGNOSIS — M9903 Segmental and somatic dysfunction of lumbar region: Secondary | ICD-10-CM | POA: Diagnosis not present

## 2019-04-23 DIAGNOSIS — M9902 Segmental and somatic dysfunction of thoracic region: Secondary | ICD-10-CM | POA: Diagnosis not present

## 2019-04-23 DIAGNOSIS — M9901 Segmental and somatic dysfunction of cervical region: Secondary | ICD-10-CM | POA: Diagnosis not present

## 2019-04-23 DIAGNOSIS — G542 Cervical root disorders, not elsewhere classified: Secondary | ICD-10-CM | POA: Diagnosis not present

## 2019-04-24 DIAGNOSIS — M9902 Segmental and somatic dysfunction of thoracic region: Secondary | ICD-10-CM | POA: Diagnosis not present

## 2019-04-24 DIAGNOSIS — G542 Cervical root disorders, not elsewhere classified: Secondary | ICD-10-CM | POA: Diagnosis not present

## 2019-04-24 DIAGNOSIS — M9903 Segmental and somatic dysfunction of lumbar region: Secondary | ICD-10-CM | POA: Diagnosis not present

## 2019-04-24 DIAGNOSIS — M9901 Segmental and somatic dysfunction of cervical region: Secondary | ICD-10-CM | POA: Diagnosis not present

## 2019-04-29 DIAGNOSIS — M9903 Segmental and somatic dysfunction of lumbar region: Secondary | ICD-10-CM | POA: Diagnosis not present

## 2019-04-29 DIAGNOSIS — M9901 Segmental and somatic dysfunction of cervical region: Secondary | ICD-10-CM | POA: Diagnosis not present

## 2019-04-29 DIAGNOSIS — M9902 Segmental and somatic dysfunction of thoracic region: Secondary | ICD-10-CM | POA: Diagnosis not present

## 2019-04-29 DIAGNOSIS — G542 Cervical root disorders, not elsewhere classified: Secondary | ICD-10-CM | POA: Diagnosis not present

## 2019-04-30 DIAGNOSIS — M9904 Segmental and somatic dysfunction of sacral region: Secondary | ICD-10-CM | POA: Diagnosis not present

## 2019-04-30 DIAGNOSIS — M9903 Segmental and somatic dysfunction of lumbar region: Secondary | ICD-10-CM | POA: Diagnosis not present

## 2019-04-30 DIAGNOSIS — G544 Lumbosacral root disorders, not elsewhere classified: Secondary | ICD-10-CM | POA: Diagnosis not present

## 2019-04-30 DIAGNOSIS — M9905 Segmental and somatic dysfunction of pelvic region: Secondary | ICD-10-CM | POA: Diagnosis not present

## 2019-05-07 DIAGNOSIS — M9902 Segmental and somatic dysfunction of thoracic region: Secondary | ICD-10-CM | POA: Diagnosis not present

## 2019-05-07 DIAGNOSIS — G542 Cervical root disorders, not elsewhere classified: Secondary | ICD-10-CM | POA: Diagnosis not present

## 2019-05-07 DIAGNOSIS — M9901 Segmental and somatic dysfunction of cervical region: Secondary | ICD-10-CM | POA: Diagnosis not present

## 2019-05-07 DIAGNOSIS — M9903 Segmental and somatic dysfunction of lumbar region: Secondary | ICD-10-CM | POA: Diagnosis not present

## 2019-05-08 DIAGNOSIS — M9902 Segmental and somatic dysfunction of thoracic region: Secondary | ICD-10-CM | POA: Diagnosis not present

## 2019-05-08 DIAGNOSIS — M9903 Segmental and somatic dysfunction of lumbar region: Secondary | ICD-10-CM | POA: Diagnosis not present

## 2019-05-08 DIAGNOSIS — M9901 Segmental and somatic dysfunction of cervical region: Secondary | ICD-10-CM | POA: Diagnosis not present

## 2019-05-08 DIAGNOSIS — G542 Cervical root disorders, not elsewhere classified: Secondary | ICD-10-CM | POA: Diagnosis not present

## 2019-05-14 DIAGNOSIS — M9901 Segmental and somatic dysfunction of cervical region: Secondary | ICD-10-CM | POA: Diagnosis not present

## 2019-05-14 DIAGNOSIS — M9902 Segmental and somatic dysfunction of thoracic region: Secondary | ICD-10-CM | POA: Diagnosis not present

## 2019-05-14 DIAGNOSIS — M9903 Segmental and somatic dysfunction of lumbar region: Secondary | ICD-10-CM | POA: Diagnosis not present

## 2019-05-14 DIAGNOSIS — G542 Cervical root disorders, not elsewhere classified: Secondary | ICD-10-CM | POA: Diagnosis not present

## 2019-05-15 DIAGNOSIS — G542 Cervical root disorders, not elsewhere classified: Secondary | ICD-10-CM | POA: Diagnosis not present

## 2019-05-15 DIAGNOSIS — M9903 Segmental and somatic dysfunction of lumbar region: Secondary | ICD-10-CM | POA: Diagnosis not present

## 2019-05-15 DIAGNOSIS — M9901 Segmental and somatic dysfunction of cervical region: Secondary | ICD-10-CM | POA: Diagnosis not present

## 2019-05-15 DIAGNOSIS — M9902 Segmental and somatic dysfunction of thoracic region: Secondary | ICD-10-CM | POA: Diagnosis not present

## 2019-06-11 DIAGNOSIS — G542 Cervical root disorders, not elsewhere classified: Secondary | ICD-10-CM | POA: Diagnosis not present

## 2019-06-11 DIAGNOSIS — M9901 Segmental and somatic dysfunction of cervical region: Secondary | ICD-10-CM | POA: Diagnosis not present

## 2019-06-11 DIAGNOSIS — M9902 Segmental and somatic dysfunction of thoracic region: Secondary | ICD-10-CM | POA: Diagnosis not present

## 2019-06-11 DIAGNOSIS — M9903 Segmental and somatic dysfunction of lumbar region: Secondary | ICD-10-CM | POA: Diagnosis not present

## 2019-06-12 DIAGNOSIS — M9903 Segmental and somatic dysfunction of lumbar region: Secondary | ICD-10-CM | POA: Diagnosis not present

## 2019-06-12 DIAGNOSIS — M9902 Segmental and somatic dysfunction of thoracic region: Secondary | ICD-10-CM | POA: Diagnosis not present

## 2019-06-12 DIAGNOSIS — G542 Cervical root disorders, not elsewhere classified: Secondary | ICD-10-CM | POA: Diagnosis not present

## 2019-06-12 DIAGNOSIS — M9901 Segmental and somatic dysfunction of cervical region: Secondary | ICD-10-CM | POA: Diagnosis not present

## 2019-06-17 DIAGNOSIS — M9901 Segmental and somatic dysfunction of cervical region: Secondary | ICD-10-CM | POA: Diagnosis not present

## 2019-06-17 DIAGNOSIS — G542 Cervical root disorders, not elsewhere classified: Secondary | ICD-10-CM | POA: Diagnosis not present

## 2019-06-17 DIAGNOSIS — M9902 Segmental and somatic dysfunction of thoracic region: Secondary | ICD-10-CM | POA: Diagnosis not present

## 2019-06-17 DIAGNOSIS — M9903 Segmental and somatic dysfunction of lumbar region: Secondary | ICD-10-CM | POA: Diagnosis not present

## 2019-06-18 DIAGNOSIS — G544 Lumbosacral root disorders, not elsewhere classified: Secondary | ICD-10-CM | POA: Diagnosis not present

## 2019-06-18 DIAGNOSIS — M9905 Segmental and somatic dysfunction of pelvic region: Secondary | ICD-10-CM | POA: Diagnosis not present

## 2019-06-18 DIAGNOSIS — M9903 Segmental and somatic dysfunction of lumbar region: Secondary | ICD-10-CM | POA: Diagnosis not present

## 2019-06-18 DIAGNOSIS — M9904 Segmental and somatic dysfunction of sacral region: Secondary | ICD-10-CM | POA: Diagnosis not present

## 2019-06-19 DIAGNOSIS — G542 Cervical root disorders, not elsewhere classified: Secondary | ICD-10-CM | POA: Diagnosis not present

## 2019-06-19 DIAGNOSIS — M9901 Segmental and somatic dysfunction of cervical region: Secondary | ICD-10-CM | POA: Diagnosis not present

## 2019-06-19 DIAGNOSIS — M9903 Segmental and somatic dysfunction of lumbar region: Secondary | ICD-10-CM | POA: Diagnosis not present

## 2019-06-19 DIAGNOSIS — M9902 Segmental and somatic dysfunction of thoracic region: Secondary | ICD-10-CM | POA: Diagnosis not present

## 2019-06-27 NOTE — Progress Notes (Signed)
Subjective:   Maxwell Hanson is a 69 y.o. male who presents for Medicare Annual/Subsequent preventive examination.  Review of Systems:  Home Safety/Smoke Alarms: Feels safe in home. Smoke alarms in place.  Lives w/ wife and dtr in 1 story home.  Male:   CCS- Cologuard 11/10/17. Negative  PSA-  Lab Results  Component Value Date   PSA 0.52 01/06/2015   PSA 0.48 12/18/2013       Objective:    Vitals: BP 130/86 (BP Location: Left Arm, Patient Position: Sitting, Cuff Size: Normal)   Pulse 60   Temp (!) 97 F (36.1 C) (Temporal)   Ht 5\' 10"  (1.778 m)   Wt 214 lb 3.2 oz (97.2 kg)   SpO2 98%   BMI 30.73 kg/m   Body mass index is 30.73 kg/m.  Advanced Directives 07/01/2019 06/28/2018 03/10/2016 08/27/2012  Does Patient Have a Medical Advance Directive? No No No Patient does not have advance directive;Patient would not like information  Does patient want to make changes to medical advance directive? No - Patient declined - - -  Would patient like information on creating a medical advance directive? - No - Patient declined No - Patient declined -    Tobacco Social History   Tobacco Use  Smoking Status Former Smoker  Smokeless Tobacco Never Used  Tobacco Comment   quit when he was 20s, only smoked for 5 yrs     Counseling given: Not Answered Comment: quit when he was 20s, only smoked for 5 yrs   Clinical Intake: Pain : No/denies pain      Past Medical History:  Diagnosis Date  . DDD (degenerative disc disease), lumbar   . Diabetes (HCC)   . Hyperlipidemia   . Hypertension   . Refuses treatment 11/10/2017   statin   Past Surgical History:  Procedure Laterality Date  . HYDROCELE EXCISION / REPAIR    . KIDNEY STONE SURGERY  2013  . TOOTH EXTRACTION     Full Dentures   Family History  Problem Relation Age of Onset  . Hypertension Mother        Living  . Arthritis Mother   . Stroke Father 29       Deceased  . Alcohol abuse Father   . Heart disease Sister    #1  . Hypertension Sister        #5  . Diabetes Brother        Deceased  . Stroke Brother        #2  . Alcohol abuse Brother        #2  . Throat cancer Brother        #3  . Arthritis/Rheumatoid Daughter   . Healthy Daughter        #2  . Alcohol abuse Son   . Heart attack Neg Hx    Social History   Socioeconomic History  . Marital status: Married    Spouse name: Not on file  . Number of children: Not on file  . Years of education: Not on file  . Highest education level: Not on file  Occupational History  . Not on file  Tobacco Use  . Smoking status: Former 89  . Smokeless tobacco: Never Used  . Tobacco comment: quit when he was 20s, only smoked for 5 yrs  Substance and Sexual Activity  . Alcohol use: No    Alcohol/week: 0.0 standard drinks  . Drug use: No  . Sexual activity: Yes  Other  Topics Concern  . Not on file  Social History Narrative  . Not on file   Social Determinants of Health   Financial Resource Strain: Low Risk   . Difficulty of Paying Living Expenses: Not hard at all  Food Insecurity: No Food Insecurity  . Worried About Programme researcher, broadcasting/film/video in the Last Year: Never true  . Ran Out of Food in the Last Year: Never true  Transportation Needs: No Transportation Needs  . Lack of Transportation (Medical): No  . Lack of Transportation (Non-Medical): No  Physical Activity:   . Days of Exercise per Week:   . Minutes of Exercise per Session:   Stress:   . Feeling of Stress :   Social Connections:   . Frequency of Communication with Friends and Family:   . Frequency of Social Gatherings with Friends and Family:   . Attends Religious Services:   . Active Member of Clubs or Organizations:   . Attends Banker Meetings:   Marland Kitchen Marital Status:     Outpatient Encounter Medications as of 07/01/2019  Medication Sig  . hydrochlorothiazide (HYDRODIURIL) 25 MG tablet Take 1 tablet (25 mg total) by mouth daily.  . Magnesium 250 MG TABS Take 1  tablet by mouth daily.  . potassium chloride (KLOR-CON M10) 10 MEQ tablet Take 1 tablet (10 mEq total) by mouth daily.  Marland Kitchen losartan (COZAAR) 100 MG tablet Take 1 tablet (100 mg total) by mouth daily. (Patient not taking: Reported on 07/01/2019)   No facility-administered encounter medications on file as of 07/01/2019.    Activities of Daily Living In your present state of health, do you have any difficulty performing the following activities: 07/01/2019  Hearing? N  Vision? N  Difficulty concentrating or making decisions? N  Walking or climbing stairs? N  Dressing or bathing? N  Doing errands, shopping? N  Preparing Food and eating ? N  Using the Toilet? N  In the past six months, have you accidently leaked urine? N  Do you have problems with loss of bowel control? N  Managing your Medications? N  Managing your Finances? N  Housekeeping or managing your Housekeeping? N  Some recent data might be hidden    Patient Care Team: Sharlene Dory, DO as PCP - General (Family Medicine)   Assessment:   This is a routine wellness examination for Maxwell Hanson. Physical assessment deferred to PCP.  Exercise Activities and Dietary recommendations Current Exercise Habits: Home exercise routine, Type of exercise: walking, Time (Minutes): 60, Frequency (Times/Week): 2, Weekly Exercise (Minutes/Week): 120, Intensity: Mild, Exercise limited by: None identified   Diet (meal preparation, eat out, water intake, caffeinated beverages, dairy products, fruits and vegetables): 24 hr recall  Breakfast: cheese crossant and hash browns Lunch: skipped. Late breakfast Dinner: fruit  Goals    . Be happy.       Fall Risk Fall Risk  07/01/2019 06/28/2018 03/10/2016 09/30/2015 06/03/2014  Falls in the past year? 0 0 No No No  Number falls in past yr: 0 - - - -  Injury with Fall? 0 - - - -  Follow up Education provided;Falls prevention discussed - - - -    Depression Screen PHQ 2/9 Scores 07/01/2019 06/28/2018  03/10/2016 09/30/2015  PHQ - 2 Score 0 0 0 0  PHQ- 9 Score - - - 0    Cognitive Function Ad8 score reviewed for issues:  Issues making decisions:no  Less interest in hobbies / activities:no  Repeats questions, stories (family  complaining):no  Trouble using ordinary gadgets (microwave, computer, phone):no  Forgets the month or year: no  Mismanaging finances: no  Remembering appts:no  Daily problems with thinking and/or memory:no Ad8 score is=0     MMSE - Mini Mental State Exam 03/10/2016  Orientation to time 5  Orientation to Place 5  Registration 3  Attention/ Calculation 5  Recall 3  Language- name 2 objects 2  Language- repeat 1  Language- follow 3 step command 3  Language- read & follow direction 1  Write a sentence 1  Copy design 1  Total score 30        Immunization History  Administered Date(s) Administered  . Pneumococcal Conjugate-13 07/13/2016  . Pneumococcal Polysaccharide-23 11/10/2017  . Tdap 02/08/2011   Screening Tests Health Maintenance  Topic Date Due  . COVID-19 Vaccine (1) Never done  . OPHTHALMOLOGY EXAM  03/24/2017  . HEMOGLOBIN A1C  05/21/2019  . INFLUENZA VACCINE  09/08/2019  . FOOT EXAM  11/20/2019  . Fecal DNA (Cologuard)  11/10/2020  . TETANUS/TDAP  02/07/2021  . Hepatitis C Screening  Completed  . PNA vac Low Risk Adult  Completed        Plan:   See you next year!  Continue to eat heart healthy diet (full of fruits, vegetables, whole grains, lean protein, water--limit salt, fat, and sugar intake) and increase physical activity as tolerated.  Continue doing brain stimulating activities (puzzles, reading, adult coloring books, staying active) to keep memory sharp.    I have personally reviewed and noted the following in the patient's chart:   . Medical and social history . Use of alcohol, tobacco or illicit drugs  . Current medications and supplements . Functional ability and status . Nutritional status . Physical  activity . Advanced directives . List of other physicians . Hospitalizations, surgeries, and ER visits in previous 12 months . Vitals . Screenings to include cognitive, depression, and falls . Referrals and appointments  In addition, I have reviewed and discussed with patient certain preventive protocols, quality metrics, and best practice recommendations. A written personalized care plan for preventive services as well as general preventive health recommendations were provided to patient.     Maxwell Hanson, [place]  07/01/2019

## 2019-07-01 ENCOUNTER — Other Ambulatory Visit: Payer: Self-pay

## 2019-07-01 ENCOUNTER — Encounter: Payer: Self-pay | Admitting: *Deleted

## 2019-07-01 ENCOUNTER — Ambulatory Visit (INDEPENDENT_AMBULATORY_CARE_PROVIDER_SITE_OTHER): Payer: Medicare Other | Admitting: *Deleted

## 2019-07-01 VITALS — BP 130/86 | HR 60 | Temp 97.0°F | Ht 70.0 in | Wt 214.2 lb

## 2019-07-01 DIAGNOSIS — Z0000 Encounter for general adult medical examination without abnormal findings: Secondary | ICD-10-CM | POA: Diagnosis not present

## 2019-07-01 NOTE — Patient Instructions (Signed)
See you next year!  Continue to eat heart healthy diet (full of fruits, vegetables, whole grains, lean protein, water--limit salt, fat, and sugar intake) and increase physical activity as tolerated.  Continue doing brain stimulating activities (puzzles, reading, adult coloring books, staying active) to keep memory sharp.    Maxwell Hanson , Thank you for taking time to come for your Medicare Wellness Visit. I appreciate your ongoing commitment to your health goals. Please review the following plan we discussed and let me know if I can assist you in the future.   These are the goals we discussed: Goals    . Be happy.       This is a list of the screening recommended for you and due dates:  Health Maintenance  Topic Date Due  . COVID-19 Vaccine (1) Never done  . Eye exam for diabetics  03/24/2017  . Hemoglobin A1C  05/21/2019  . Flu Shot  09/08/2019  . Complete foot exam   11/20/2019  . Cologuard (Stool DNA test)  11/10/2020  . Tetanus Vaccine  02/07/2021  .  Hepatitis C: One time screening is recommended by Center for Disease Control  (CDC) for  adults born from [birth year] through 1965.   Completed  . Pneumonia vaccines  Completed    Preventive Care 83 Years and Older, Male Preventive care refers to lifestyle choices and visits with your health care provider that can promote health and wellness. This includes:  A yearly physical exam. This is also called an annual well check.  Regular dental and eye exams.  Immunizations.  Screening for certain conditions.  Healthy lifestyle choices, such as diet and exercise. What can I expect for my preventive care visit? Physical exam Your health care provider will check:  Height and weight. These may be used to calculate body mass index (BMI), which is a measurement that tells if you are at a healthy weight.  Heart rate and blood pressure.  Your skin for abnormal spots. Counseling Your health care provider may ask you questions  about:  Alcohol, tobacco, and drug use.  Emotional well-being.  Home and relationship well-being.  Sexual activity.  Eating habits.  History of falls.  Memory and ability to understand (cognition).  Work and work Statistician. What immunizations do I need?  Influenza (flu) vaccine  This is recommended every year. Tetanus, diphtheria, and pertussis (Tdap) vaccine  You may need a Td booster every 10 years. Varicella (chickenpox) vaccine  You may need this vaccine if you have not already been vaccinated. Zoster (shingles) vaccine  You may need this after age 47. Pneumococcal conjugate (PCV13) vaccine  One dose is recommended after age 55. Pneumococcal polysaccharide (PPSV23) vaccine  One dose is recommended after age 1. Measles, mumps, and rubella (MMR) vaccine  You may need at least one dose of MMR if you were born in 1957 or later. You may also need a second dose. Meningococcal conjugate (MenACWY) vaccine  You may need this if you have certain conditions. Hepatitis A vaccine  You may need this if you have certain conditions or if you travel or work in places where you may be exposed to hepatitis A. Hepatitis B vaccine  You may need this if you have certain conditions or if you travel or work in places where you may be exposed to hepatitis B. Haemophilus influenzae type b (Hib) vaccine  You may need this if you have certain conditions. You may receive vaccines as individual doses or as more than one  vaccine together in one shot (combination vaccines). Talk with your health care provider about the risks and benefits of combination vaccines. What tests do I need? Blood tests  Lipid and cholesterol levels. These may be checked every 5 years, or more frequently depending on your overall health.  Hepatitis C test.  Hepatitis B test. Screening  Lung cancer screening. You may have this screening every year starting at age 19 if you have a 30-pack-year history of  smoking and currently smoke or have quit within the past 15 years.  Colorectal cancer screening. All adults should have this screening starting at age 74 and continuing until age 88. Your health care provider may recommend screening at age 55 if you are at increased risk. You will have tests every 1-10 years, depending on your results and the type of screening test.  Prostate cancer screening. Recommendations will vary depending on your family history and other risks.  Diabetes screening. This is done by checking your blood sugar (glucose) after you have not eaten for a while (fasting). You may have this done every 1-3 years.  Abdominal aortic aneurysm (AAA) screening. You may need this if you are a current or former smoker.  Sexually transmitted disease (STD) testing. Follow these instructions at home: Eating and drinking  Eat a diet that includes fresh fruits and vegetables, whole grains, lean protein, and low-fat dairy products. Limit your intake of foods with high amounts of sugar, saturated fats, and salt.  Take vitamin and mineral supplements as recommended by your health care provider.  Do not drink alcohol if your health care provider tells you not to drink.  If you drink alcohol: ? Limit how much you have to 0-2 drinks a day. ? Be aware of how much alcohol is in your drink. In the [place] one drink equals one 12 oz bottle of beer (355 mL), one 5 oz glass of wine (148 mL), or one 1 oz glass of hard liquor (44 mL). Lifestyle  Take daily care of your teeth and gums.  Stay active. Exercise for at least 30 minutes on 5 or more days each week.  Do not use any products that contain nicotine or tobacco, such as cigarettes, e-cigarettes, and chewing tobacco. If you need help quitting, ask your health care provider.  If you are sexually active, practice safe sex. Use a condom or other form of protection to prevent STIs (sexually transmitted infections).  Talk with your health care  provider about taking a low-dose aspirin or statin. What's next?  Visit your health care provider once a year for a well check visit.  Ask your health care provider how often you should have your eyes and teeth checked.  Stay up to date on all vaccines. This information is not intended to replace advice given to you by your health care provider. Make sure you discuss any questions you have with your health care provider. Document Revised: 01/18/2018 Document Reviewed: 01/18/2018 Elsevier Patient Education  2020 [employer].

## 2019-07-10 ENCOUNTER — Other Ambulatory Visit: Payer: Self-pay

## 2019-07-10 ENCOUNTER — Encounter: Payer: Self-pay | Admitting: Family Medicine

## 2019-07-10 ENCOUNTER — Ambulatory Visit (INDEPENDENT_AMBULATORY_CARE_PROVIDER_SITE_OTHER): Payer: Medicare Other | Admitting: Family Medicine

## 2019-07-10 VITALS — BP 140/88 | HR 66 | Temp 95.9°F | Ht 71.0 in | Wt 218.2 lb

## 2019-07-10 DIAGNOSIS — E1165 Type 2 diabetes mellitus with hyperglycemia: Secondary | ICD-10-CM

## 2019-07-10 DIAGNOSIS — S76319A Strain of muscle, fascia and tendon of the posterior muscle group at thigh level, unspecified thigh, initial encounter: Secondary | ICD-10-CM

## 2019-07-10 DIAGNOSIS — I10 Essential (primary) hypertension: Secondary | ICD-10-CM | POA: Diagnosis not present

## 2019-07-10 LAB — COMPREHENSIVE METABOLIC PANEL
ALT: 51 U/L (ref 0–53)
AST: 49 U/L — ABNORMAL HIGH (ref 0–37)
Albumin: 4 g/dL (ref 3.5–5.2)
Alkaline Phosphatase: 60 U/L (ref 39–117)
BUN: 13 mg/dL (ref 6–23)
CO2: 29 mEq/L (ref 19–32)
Calcium: 9.4 mg/dL (ref 8.4–10.5)
Chloride: 100 mEq/L (ref 96–112)
Creatinine, Ser: 1.09 mg/dL (ref 0.40–1.50)
GFR: 81.11 mL/min (ref >60.00)
Glucose, Bld: 142 mg/dL — ABNORMAL HIGH (ref 70–99)
Potassium: 3.8 mEq/L (ref 3.5–5.1)
Sodium: 135 mEq/L (ref 135–145)
Total Bilirubin: 1.5 mg/dL — ABNORMAL HIGH (ref 0.2–1.2)
Total Protein: 7.4 g/dL (ref 6.0–8.3)

## 2019-07-10 LAB — LIPID PANEL
Cholesterol: 244 mg/dL — ABNORMAL HIGH (ref 0–200)
HDL: 52.1 mg/dL (ref >39.00)
LDL Cholesterol: 160 mg/dL — ABNORMAL HIGH (ref 0–99)
NonHDL: 191.6
Total CHOL/HDL Ratio: 5
Triglycerides: 157 mg/dL — ABNORMAL HIGH (ref 0.0–149.0)
VLDL: 31.4 mg/dL (ref 0.0–40.0)

## 2019-07-10 LAB — HEMOGLOBIN A1C: Hgb A1c MFr Bld: 7.3 % — ABNORMAL HIGH (ref 4.6–6.5)

## 2019-07-10 NOTE — Patient Instructions (Signed)
Heat (pad or rice pillow in microwave) over affected area, 10-15 minutes twice daily.   OK to take Tylenol 1000 mg (2 extra strength tabs) or 975 mg (3 regular strength tabs) every 6 hours as needed.  Give [place] 2-3 business days to get the results of your labs back.   Keep the diet clean and stay active.  Let me know if you change your mind about the blood pressure medicine. There are alternatives that can protect your kidneys better.  Consider contacting your eye provider.  Semimembranosus Tendinitis Rehab  It is normal to feel mild stretching, pulling, tightness, or discomfort as you do these exercises, but you should stop right away if you feel sudden pain or your pain gets worse.  Stretching and range of motion exercises These exercises warm up your muscles and joints and improve the movement and flexibility of your thigh. These exercises also help to relieve pain, numbness, and tingling. Exercise A: Hamstring stretch, supine    1. Lie on your back. Loop a belt or towel across the ball of your left / right foot The ball of your foot is on the walking surface, right under your toes. 2. Straighten your left / right knee and slowly pull on the belt to raise your leg. Stop when you feel a gentle stretch behind your left / right knee or thigh. ? Do not allow the knee to bend. ? Keep your other leg flat on the floor. 3. Hold this position for 30 seconds. Repeat 2 times. Complete this exercise 3 times a week. Strengthening exercises These exercises build strength and endurance in your thigh. Endurance is the ability to use your muscles for a long time, even after they get tired. Exercise B: Straight leg raises (hip extensors) 1. Lie on your belly on a bed or a firm surface with a pillow under your hips. 2. Bend your left / right knee so your foot is straight up in the air. 3. Squeeze your buttock muscles and lift your left / right thigh off the bed. Do not let your back arch. 4. Hold this  position for 3 seconds. 5. Slowly return to the starting position. Let your muscles relax completely before you do another repetition. Repeat 2 times. Complete this exercise 3 times a week. Exercise C: Bridge (hip extensors)     1. Lie on your back on a firm surface with your knees bent and your feet flat on the floor. 2. Tighten your buttocks muscles and lift your bottom off the floor until your trunk is level with your thighs. ? You should feel the muscles working in your buttocks and the back of your thighs. If you do not feel these muscles, slide your feet 1-2 inches (2.5-5 cm) farther away from your buttocks. ? Do not arch your back. 3. Hold this position for 3 seconds. 4. Slowly lower your hips to the starting position. 5. Let your buttocks muscles relax completely between repetitions. If this exercise is too easy, try doing it with your arms crossed over your chest. Repeat 2 times. Complete this exercise 3 times a week. Exercise D: Hamstring eccentric, prone 1. Lie on your belly on a bed or on the floor. 2. Start with your legs straight. Cross your legs at the ankles with your left / right leg on top. 3. Using your bottom leg to do the work, bend both knees. 4. Using just your left / right leg alone, slowly lower your leg back down toward the  bed. Add a 5 lb weight as told by your health care provider. 5. Let your muscles relax completely between repetitions. Repeat 2 times. Complete this exercise 3 times a week. Exercise E: Squats 1. Stand in front of a table, with your feet and knees pointing straight ahead. You may rest your hands on the table for balance but not for support. 2. Slowly bend your knees and lower your hips like you are going to sit in a chair. Keep your thighs straight or pointed slightly outward. ? Keep your weight over your heels, not over your toes. ? Keep your lower legs upright so they are parallel with the table legs. ? Do not let your hips go lower than your  knees. Stop when your knees are bent to the shape of an upside-down letter "L" (90 degree angle). ? Do not bend lower than told by your health care provider. ? If your knee pain increases, do not bend as low. 3. Hold the squat position 1-2 seconds. 4. Slowly push with your legs to return to standing. Do not use your hands to pull yourself to standing. Repeat 2 times. Complete this exercise 3 times a week. Make sure you discuss any questions you have with your health care provider. Document Released: 01/24/2005 Document Revised: 10/01/2015 Document Reviewed: 10/28/2014 Elsevier Interactive Patient Education  2018 [employer] Inc[employer]  Gluteus Medius Syndrome Rehab It is normal to feel mild stretching, pulling, tightness, or discomfort as you do these exercises, but you should stop right away if you feel sudden pain or your pain gets worse.   Stretching and range of motion exercise This exercise warms up your muscles and joints and improves the movement and flexibility of your hip and pelvis. This exercise also helps to relieve pain and stiffness. Exercise A: Lunge (hip flexor stretch)     1. Kneel on the floor on your left / right knee. Bend your other knee so it is directly over your ankle. 2. Keep good posture with your head over your shoulders. Tuck your tailbone underneath you. This will prevent your back from arching too much. 3. You should feel a gentle stretch in the front of your thigh or hip. If you do not feel a stretch, slowly lunge forward with your chest up. 4. Hold this position for 30 seconds. 5. Slowly return to the starting position. Repeat 2 times. Complete this exercise 3 times per week. Strengthening exercises These exercises build strength and endurance in your hip and pelvis. Endurance is the ability to use your muscles for a long time, even after they get tired. Exercise B: Bridge (hip extensors)    1. Lie on your back on a firm surface with your knees bent and your  feet flat on the floor. 2. Tighten your buttocks muscles and lift your bottom off the floor until the trunk of your body is level with your thighs. ? You should feel the muscles working in your buttocks and the back of your thighs. If this exercise is too easy, cross your arms over your chest or lift one leg while your bottom is up off the floor. ? Do not arch your back. 3. Hold this position for 3 seconds. 4. Slowly lower your hips to the starting position. 5. Let your muscles relax completely between repetitions. Repeat 2 times. Complete this exercise 3 times per week. Exercise C: Straight leg raises (hip abductors)    1. Lie on your side with your left / right leg in the  top position. Lie so your head, shoulder, knee, and hip line up. Bend your bottom knee to help you balance. 2. Lift your top leg up 4-6 inches (10-15 cm), keeping your toes pointed straight ahead. 3. Hold this position for 2 seconds. 4. Slowly lower your leg to the starting position and let your muscles relax completely. Repeat for a total of 10 repetitions. Repeat 2 times. Complete this exercise 3 times per week. Exercise D: Hip abductors and external rotators, quadruped 1. Get on your hands and knees on a firm, lightly padded surface. Your hands should be directly below your shoulders, and your knees should be directly below your hips. 2. Lift your left / right knee out to the side. Keep your knee bent. Do not twist your body. 3. Hold this position for 3 seconds. 4. Slowly lower your leg. Repeat for a total of 10 repetitions.  Repeat 2 times. Complete this exercise 3 times per week. Exercise E: Single leg stand 1. Stand near a counter or door frame to hold onto as needed. It is helpful to look in a mirror for this exercise so you can watch your hip. 2. Squeeze your left / right buttock muscles then lift up your other foot. Do not let your left / righthip push out to the side. 3. Hold this position for 3 seconds.  Repeat for a total of 10 repetitions. Repeat 2 times. Complete this exercise 3 times per week. Make sure you discuss any questions you have with your health care provider. Document Released: 01/24/2005 Document Revised: 10/01/2015 Document Reviewed: 01/06/2015 Elsevier Interactive Patient Education  [employer].

## 2019-07-10 NOTE — Progress Notes (Signed)
Subjective:   Chief Complaint  Patient presents with   Follow-up   hamstring pain in both legs   Medication Problem    Took himself off Losartan 05/31/19    Maxwell Hanson is a 69 y.o. male here for follow-up of diabetes.   Maxwell Hanson does not check his sugars routinely.  Patient does not require insulin.   Medications include: Diet controlled Diet is healthy overall Exercise: walking 2-3x weekly  He does monitor home blood pressures. Usually runs 140/80's.  He is compliant with medication- HCTZ 25 mg/d. Patient has these side effects of medication: none Diet/exercise as above.  B/l hamstring pain 1 week ago, no inj or change in activity. No bruising, swelling or redness. Walking is OK once he starts moving around. R side is worse. He does not stretch. Has tried OTC NSAIDs that helps a bit for a time. No neurologic s/s's. Not improving.   Past Medical History:  Diagnosis Date   DDD (degenerative disc disease), lumbar    Diabetes (HCC)    Hyperlipidemia    Hypertension    Refuses treatment 11/10/2017   statin     Related testing: Date of retinal exam: Due Pneumovax: done  Review of Systems: Pulmonary:  No SOB Cardiovascular:  No chest pain  Objective:  BP 140/88 (BP Location: Left Arm, Patient Position: Sitting, Cuff Size: Normal)    Pulse 66    Temp (!) 95.9 F (35.5 C) (Temporal)    Ht 5\' 11"  (1.803 m)    Wt 218 lb 4 oz (99 kg)    SpO2 97%    BMI 30.44 kg/m  General:  Well developed, well nourished, in no apparent distress Skin:  Warm, no pallor or diaphoresis Head:  Normocephalic, atraumatic Eyes:  Pupils equal and round, sclera anicteric without injection  Lungs:  CTAB, no access msc use Cardio:  RRR, no bruits, no LE edema Musculoskeletal:  Symmetrical muscle groups noted without atrophy or deformity Neuro:  Sensation intact to pinprick on feet Psych: Age appropriate judgment and insight  Assessment:   Type 2 diabetes mellitus with hyperglycemia, without  long-term current use of insulin (HCC) - Plan: Hemoglobin A1c, Comprehensive metabolic panel, Lipid panel  Essential hypertension, benign  Hamstring strain, unspecified laterality, initial encounter   Plan:   1- Ck labs, needs to see eye provider. Counseled on diet and exercise. 2- Pt stopped his ARB 2/2 skin irritation that resolved upon d/c. He states ACEi's do not work for him. He does not wish to go on Norvasc. I stated that this could help preserve his kidneys, but he still did not wish to change. I told him to let me know if he changes his mind.  3- Stretches/exercises, heat, ice, Tylenol. PT if no better.  F/u in 6 mo for CPE or prn. The patient voiced understanding and agreement to the plan.  [place], DO 07/10/19 7:47 AM

## 2019-09-13 ENCOUNTER — Other Ambulatory Visit: Payer: Self-pay | Admitting: Family Medicine

## 2019-09-13 DIAGNOSIS — I10 Essential (primary) hypertension: Secondary | ICD-10-CM

## 2019-12-11 ENCOUNTER — Telehealth: Payer: Self-pay | Admitting: Family Medicine

## 2019-12-11 DIAGNOSIS — I10 Essential (primary) hypertension: Secondary | ICD-10-CM

## 2019-12-11 MED ORDER — HYDROCHLOROTHIAZIDE 25 MG PO TABS: 25.0000 mg | ORAL_TABLET | Freq: Every day | ORAL | 0 refills | Status: DC

## 2019-12-11 NOTE — Telephone Encounter (Signed)
Medication: hydrochlorothiazide (HYDRODIURIL) 25 MG tablet [[id number]]       Has the patient contacted their pharmacy?  (If no, request that the patient contact the pharmacy for the refill.) (If yes, when and what did the pharmacy advise?)     Preferred Pharmacy (with phone number or street name): [id number] - [place], [place] - [address]  [address], [place] [address]  Phone:  [phone] Fax:  [phone]      Agent: Please be advised that RX refills may take up to 3 business days. We ask that you follow-up with your pharmacy.

## 2020-03-09 ENCOUNTER — Telehealth: Payer: Self-pay | Admitting: Family Medicine

## 2020-03-09 DIAGNOSIS — I10 Essential (primary) hypertension: Secondary | ICD-10-CM

## 2020-03-09 MED ORDER — HYDROCHLOROTHIAZIDE 25 MG PO TABS: 25.0000 mg | ORAL_TABLET | Freq: Every day | ORAL | 0 refills | Status: DC

## 2020-03-09 NOTE — Telephone Encounter (Signed)
Medication:  hydrochlorothiazide (HYDRODIURIL) 25 MG tablet [[id number]]      Has the patient contacted their pharmacy?  (If no, request that the patient contact the pharmacy for the refill.) (If yes, when and what did the pharmacy advise?)     Preferred Pharmacy (with phone number or street name):    [id number] - [place], [place] - [address]  [address], [place] [address]  Phone:  [phone] Fax:  [phone]  DEA #:    Agent: Please be advised that RX refills may take up to 3 business days. We ask that you follow-up with your pharmacy.

## 2020-03-09 NOTE — Telephone Encounter (Signed)
Rx sent 

## 2020-06-10 ENCOUNTER — Telehealth: Payer: Self-pay | Admitting: Family Medicine

## 2020-06-10 DIAGNOSIS — I10 Essential (primary) hypertension: Secondary | ICD-10-CM

## 2020-06-10 MED ORDER — HYDROCHLOROTHIAZIDE 25 MG PO TABS: 25.0000 mg | ORAL_TABLET | Freq: Every day | ORAL | 0 refills | Status: DC

## 2020-06-10 NOTE — Telephone Encounter (Signed)
Medication:hydrochlorothiazide (HYDRODIURIL) 25 MG tablet [[id number]]       Has the patient contacted their pharmacy? no (If no, request that the patient contact the pharmacy for the refill.) (If yes, when and what did the pharmacy advise?)    Preferred Pharmacy (with phone number or street name):  [facility] - [place], [place] - [address] Phone:  [phone]  Fax:  [phone]        Agent: Please be advised that RX refills may take up to 3 business days. We ask that you follow-up with your pharmacy.

## 2020-06-10 NOTE — Telephone Encounter (Signed)
Refill done.  

## 2020-09-09 ENCOUNTER — Other Ambulatory Visit: Payer: Self-pay | Admitting: Family Medicine

## 2020-09-09 DIAGNOSIS — I10 Essential (primary) hypertension: Secondary | ICD-10-CM

## 2020-09-11 ENCOUNTER — Other Ambulatory Visit: Payer: Self-pay | Admitting: Family Medicine

## 2020-09-11 DIAGNOSIS — I10 Essential (primary) hypertension: Secondary | ICD-10-CM

## 2020-10-28 ENCOUNTER — Other Ambulatory Visit: Payer: Self-pay

## 2020-10-28 ENCOUNTER — Encounter: Payer: Self-pay | Admitting: Family Medicine

## 2020-10-28 ENCOUNTER — Ambulatory Visit (INDEPENDENT_AMBULATORY_CARE_PROVIDER_SITE_OTHER): Payer: Medicare Other | Admitting: Family Medicine

## 2020-10-28 VITALS — BP 128/82 | HR 89 | Temp 101.8°F | Ht 70.4 in | Wt 202.2 lb

## 2020-10-28 DIAGNOSIS — L409 Psoriasis, unspecified: Secondary | ICD-10-CM | POA: Diagnosis not present

## 2020-10-28 DIAGNOSIS — B9689 Other specified bacterial agents as the cause of diseases classified elsewhere: Secondary | ICD-10-CM | POA: Diagnosis not present

## 2020-10-28 DIAGNOSIS — N529 Male erectile dysfunction, unspecified: Secondary | ICD-10-CM | POA: Diagnosis not present

## 2020-10-28 DIAGNOSIS — J208 Acute bronchitis due to other specified organisms: Secondary | ICD-10-CM | POA: Diagnosis not present

## 2020-10-28 MED ORDER — DOXYCYCLINE HYCLATE 100 MG PO TABS
100.0000 mg | ORAL_TABLET | Freq: Two times a day (BID) | ORAL | 0 refills | Status: DC
Start: 2020-10-28 — End: 2020-10-30

## 2020-10-28 MED ORDER — TRIAMCINOLONE ACETONIDE 0.1 % EX CREA: 1.0000 "application " | TOPICAL_CREAM | Freq: Two times a day (BID) | CUTANEOUS | 0 refills | Status: DC

## 2020-10-28 MED ORDER — BENZONATATE 100 MG PO CAPS
100.0000 mg | ORAL_CAPSULE | Freq: Three times a day (TID) | ORAL | 0 refills | Status: DC | PRN
Start: 2020-10-28 — End: 2022-03-07

## 2020-10-28 MED ORDER — SILDENAFIL CITRATE 100 MG PO TABS: 50.0000 mg | ORAL_TABLET | Freq: Every day | ORAL | 5 refills | Status: DC | PRN

## 2020-10-28 NOTE — Progress Notes (Signed)
Chief Complaint  Patient presents with   Cough    Congestion Sore throat  Eye drainage  Covid test negative 10/27/20    Maxwell Hanson here for URI complaints.  Duration: 2 weeks  Associated symptoms: Fever (subective), sinus congestion, rhinorrhea, itchy watery eyes, sore throat, myalgia, productive cough, fatigue Denies: sinus pain, ear pain, ear drainage, wheezing, shortness of breath, and N/V/D, loss of taste/smell Treatment to date: Nyquil Sick contacts: No Tested neg for covid 10/27/20.  Psoriasis Patient has a history of psoriasis.  It is gotten worse over his back.  It is also around his ears.  He does not follow with a dermatologist at this time.  He is not taking any Biologics or prescription creams.  ED The patient has a history of rectal dysfunction.  He has trouble attaining and maintaining an erection when trying to be intimate with his wife.  He is interested in trying medication.  Past Medical History:  Diagnosis Date   DDD (degenerative disc disease), lumbar    Diabetes (HCC)    Hyperlipidemia    Hypertension    Refuses treatment 11/10/2017   statin    Objective BP 128/82   Pulse 89   Temp (!) 101.8 F (38.8 C) (Oral)   Ht 5' 10.4" (1.788 m)   Wt 202 lb 4 oz (91.7 kg)   SpO2 98%   BMI 28.69 kg/m  General: Awake, alert, appears stated age HEENT: AT, [place], ears patent b/l and TM's neg, nares patent w/o discharge, pharynx pink and without exudates, MMM Neck: No masses or asymmetry Heart: RRR Lungs: CTAB, no accessory muscle use Skin: Scaling skin around the ears bilaterally Psych: Age appropriate judgment and insight, normal mood and affect  Acute bacterial bronchitis - Plan: doxycycline (VIBRA-TABS) 100 MG tablet, benzonatate (TESSALON) 100 MG capsule, Novel Coronavirus, NAA ([facility])  Psoriasis - Plan: Ambulatory referral to Dermatology, triamcinolone cream (KENALOG) 0.1 %  Erectile dysfunction, unspecified erectile dysfunction type - Plan: sildenafil  (VIAGRA) 100 MG tablet  Check for COVID.  Given fever and duration of illness, will treat for above with 7 days of doxycycline twice daily and benzonatate as needed.  Continue to push fluids, practice good hand hygiene, cover mouth when coughing. F/u prn. If starting to experience fevers, shaking, or shortness of breath, seek immediate care. Chronic, worsening.  Refer to dermatology, prescription strength steroid cream sent in to use in the meanwhile.  Continue to moisturize. Chronic, unstable.  Trial Viagra daily as needed. Follow-up in 2 months for a physical. Pt voiced understanding and agreement to the plan.  Jilda Roche [place], DO 10/28/20 11:48 AM

## 2020-10-28 NOTE — Patient Instructions (Signed)
Continue to push fluids, practice good hand hygiene, and cover your mouth if you cough.  If you start having fevers, shaking or shortness of breath, seek immediate care.  If you do not hear anything about your referral in the next 1-2 weeks, call our office and ask for an update.  Use the cream over problem areas until you get in with the dermatology team.  Use GoodRx (free app and website) for the sildenafil/Viagra.   Let us know if you need anything.

## 2020-10-30 ENCOUNTER — Other Ambulatory Visit: Payer: Self-pay | Admitting: Family Medicine

## 2020-10-30 DIAGNOSIS — B9689 Other specified bacterial agents as the cause of diseases classified elsewhere: Secondary | ICD-10-CM

## 2020-10-30 LAB — SARS-COV-2, NAA 2 DAY TAT

## 2020-10-30 LAB — NOVEL CORONAVIRUS, NAA: SARS-CoV-2, NAA: DETECTED — AB

## 2020-10-30 MED ORDER — MOLNUPIRAVIR EUA 200MG CAPSULE: 4.0000 | ORAL_CAPSULE | Freq: Two times a day (BID) | ORAL | 0 refills | Status: AC

## 2020-12-07 ENCOUNTER — Other Ambulatory Visit: Payer: Self-pay | Admitting: Family Medicine

## 2020-12-07 DIAGNOSIS — L409 Psoriasis, unspecified: Secondary | ICD-10-CM

## 2020-12-07 DIAGNOSIS — I10 Essential (primary) hypertension: Secondary | ICD-10-CM

## 2021-03-09 ENCOUNTER — Telehealth: Payer: Self-pay | Admitting: Family Medicine

## 2021-03-09 DIAGNOSIS — I10 Essential (primary) hypertension: Secondary | ICD-10-CM

## 2021-03-09 MED ORDER — HYDROCHLOROTHIAZIDE 25 MG PO TABS
25.0000 mg | ORAL_TABLET | Freq: Every day | ORAL | 0 refills | Status: DC
Start: 2021-03-09 — End: 2021-06-03

## 2021-03-09 NOTE — Telephone Encounter (Signed)
Medication: hydrochlorothiazide (HYDRODIURIL) 25 MG tablet  Has the patient contacted their pharmacy? Yes.   (If no, request that the patient contact the pharmacy for the refill.) (If yes, when and what did the pharmacy advise?)  Preferred Pharmacy (with phone number or street name):  [facility] - [place], [place] - [address]  [address], [place] [address]  Phone:  [phone]  Fax:  [phone]   Agent: Please be advised that RX refills may take up to 3 business days. We ask that you follow-up with your pharmacy.

## 2021-06-03 ENCOUNTER — Telehealth: Payer: Self-pay | Admitting: Family Medicine

## 2021-06-03 DIAGNOSIS — I10 Essential (primary) hypertension: Secondary | ICD-10-CM

## 2021-06-03 MED ORDER — HYDROCHLOROTHIAZIDE 25 MG PO TABS: 25.0000 mg | ORAL_TABLET | Freq: Every day | ORAL | 0 refills | Status: DC

## 2021-06-03 NOTE — Telephone Encounter (Signed)
Rx sent 

## 2021-06-03 NOTE — Telephone Encounter (Signed)
Pt called stating that he needed a refill for his hydrochlorothiazide. Please Advise. ? ?Medication:  ? ?hydrochlorothiazide (HYDRODIURIL) 25 MG tablet [[id number]]  ? ?Has the patient contacted their pharmacy? No. ?(If no, request that the patient contact the pharmacy for the refill.) ?(If yes, when and what did the pharmacy advise?) ? ?Preferred Pharmacy (with phone number or street name):  ? ?[facility] - [place], [place] - [address]  ?[address], [place] [address]  ?Phone:  [phone]  Fax:  [phone]  ? ?Agent: Please be advised that RX refills may take up to 3 business days. We ask that you follow-up with your pharmacy. ? ?

## 2021-09-08 ENCOUNTER — Telehealth: Payer: Self-pay | Admitting: Family Medicine

## 2021-09-08 DIAGNOSIS — I10 Essential (primary) hypertension: Secondary | ICD-10-CM

## 2021-09-08 MED ORDER — HYDROCHLOROTHIAZIDE 25 MG PO TABS: 25.0000 mg | ORAL_TABLET | Freq: Every day | ORAL | 0 refills | Status: DC

## 2021-09-08 NOTE — Telephone Encounter (Signed)
Medication: hydrochlorothiazide (HYDRODIURIL) 25 MG tablet   Has the patient contacted their pharmacy? No.  Preferred Pharmacy (with phone number or street name):  [id number] - [place], [place] - [address]  [address], [place] [address]  Phone:  [phone]  Fax:  [phone]   Agent: Please be advised that RX refills may take up to 3 business days. We ask that you follow-up with your pharmacy.

## 2021-12-06 ENCOUNTER — Other Ambulatory Visit: Payer: Self-pay | Admitting: Family Medicine

## 2021-12-06 DIAGNOSIS — I10 Essential (primary) hypertension: Secondary | ICD-10-CM

## 2021-12-13 LAB — COLOGUARD

## 2022-02-28 ENCOUNTER — Telehealth: Payer: Self-pay | Admitting: Family Medicine

## 2022-02-28 DIAGNOSIS — I10 Essential (primary) hypertension: Secondary | ICD-10-CM

## 2022-02-28 NOTE — Telephone Encounter (Signed)
Medication: hydrochlorothiazide (HYDRODIURIL) 25 MG tablet  Has the patient contacted their pharmacy? No.   Preferred Pharmacy:   [place], [place] [address] Phone: [phone]  Fax: [phone]

## 2022-02-28 NOTE — Telephone Encounter (Signed)
Called the patient unable to leave message and not able to refill medication, Last OV with PCP was on 10/28/2020. Needs appointment to get refills.

## 2022-03-01 NOTE — Telephone Encounter (Signed)
Called and no answer/BM

## 2022-03-01 NOTE — Telephone Encounter (Signed)
No answer no VM

## 2022-03-03 ENCOUNTER — Other Ambulatory Visit: Payer: Self-pay | Admitting: Family Medicine

## 2022-03-03 DIAGNOSIS — I10 Essential (primary) hypertension: Secondary | ICD-10-CM

## 2022-03-07 ENCOUNTER — Encounter: Payer: Self-pay | Admitting: Family Medicine

## 2022-03-07 ENCOUNTER — Ambulatory Visit (INDEPENDENT_AMBULATORY_CARE_PROVIDER_SITE_OTHER): Payer: Medicare Other | Admitting: Family Medicine

## 2022-03-07 VITALS — BP 138/80 | HR 67 | Temp 98.1°F | Ht 70.0 in | Wt 201.5 lb

## 2022-03-07 DIAGNOSIS — M609 Myositis, unspecified: Secondary | ICD-10-CM | POA: Insufficient documentation

## 2022-03-07 DIAGNOSIS — I10 Essential (primary) hypertension: Secondary | ICD-10-CM | POA: Diagnosis not present

## 2022-03-07 DIAGNOSIS — Z1211 Encounter for screening for malignant neoplasm of colon: Secondary | ICD-10-CM | POA: Diagnosis not present

## 2022-03-07 DIAGNOSIS — E119 Type 2 diabetes mellitus without complications: Secondary | ICD-10-CM

## 2022-03-07 DIAGNOSIS — N529 Male erectile dysfunction, unspecified: Secondary | ICD-10-CM

## 2022-03-07 DIAGNOSIS — T466X5A Adverse effect of antihyperlipidemic and antiarteriosclerotic drugs, initial encounter: Secondary | ICD-10-CM

## 2022-03-07 MED ORDER — SILDENAFIL CITRATE 100 MG PO TABS: 50.0000 mg | ORAL_TABLET | Freq: Every day | ORAL | 5 refills | Status: AC | PRN

## 2022-03-07 MED ORDER — OLMESARTAN MEDOXOMIL 20 MG PO TABS: 20.0000 mg | ORAL_TABLET | Freq: Every day | ORAL | 2 refills | Status: DC

## 2022-03-07 NOTE — Progress Notes (Signed)
Subjective:   Chief Complaint  Patient presents with   Follow-up    Maxwell Hanson is a 72 y.o. male here for follow-up of diabetes.   Maxwell Hanson does not monitor sugars at home.  Patient does not require insulin.   Medications include: diet controlled Diet is healthy.  Exercise: none  Hypertension Patient presents for hypertension follow up. He does not monitor home blood pressures. He is compliant with medication- HCTZ 25 mg/d. Patient has these side effects of medication: none Diet/exercise as above. No Cp or SOB.   Past Medical History:  Diagnosis Date   DDD (degenerative disc disease), lumbar    Diabetes ([place])    Hyperlipidemia    Hypertension    Refuses treatment 11/10/2017   statin     Related testing: Retinal exam: Done Pneumovax: done  Objective:  BP 138/80 (BP Location: Left Arm, Patient Position: Sitting, Cuff Size: Normal)   Pulse 67   Temp 98.1 F (36.7 C) (Oral)   Ht 5\' 10"  (1.778 m)   Wt 201 lb 8 oz (91.4 kg)   SpO2 96%   BMI 28.91 kg/m  General:  Well developed, well nourished, in no apparent distress Skin:  Warm, no pallor or diaphoresis Head:  Normocephalic, atraumatic Eyes:  Pupils equal and round, sclera anicteric without injection  Lungs:  CTAB, no access msc use Cardio:  RRR, no bruits, no LE edema Musculoskeletal:  Symmetrical muscle groups noted without atrophy or deformity Neuro:  Sensation intact to pinprick on feet Psych: Age appropriate judgment and insight  Assessment:   Type 2 diabetes mellitus without complication, without long-term current use of insulin (HCC)  Essential hypertension, benign   Plan:   Chronic, hopefully stable. Refuses statin. Caused pain and low energy. Counseled on diet and exercise. Refer ophtho.  Chronic, stable. Change HCTZ to ARB, Benicar 20 mg/d.  Had aches w statin, does not want to try another. Discussed how this med decreases plaque accumulation and decreases risk of heart attack/stroke.  Cologard  ordered today. Tdap recommended to get at pharmacy.  F/u in 6 mo. The patient voiced understanding and agreement to the plan.  [place], DO 03/07/22 2:26 PM

## 2022-03-07 NOTE — Patient Instructions (Addendum)
Give [place] 2-3 business days to get the results of your labs back.   Keep the diet clean and stay active.  Please contact your pharmacy to inquire about your tetanus booster.   If you do not hear anything about your referral in the next 1-2 weeks, call our office and ask for an update.  Cologard team will reach out in the next 1-2 weeks. If they don't, let me know.   Let us know if you need anything.

## 2022-03-08 LAB — COMPREHENSIVE METABOLIC PANEL
ALT: 45 U/L (ref 0–53)
AST: 50 U/L — ABNORMAL HIGH (ref 0–37)
Albumin: 4 g/dL (ref 3.5–5.2)
Alkaline Phosphatase: 69 U/L (ref 39–117)
BUN: 14 mg/dL (ref 6–23)
CO2: 31 mEq/L (ref 19–32)
Calcium: 9.9 mg/dL (ref 8.4–10.5)
Chloride: 98 mEq/L (ref 96–112)
Creatinine, Ser: 1.06 mg/dL (ref 0.40–1.50)
GFR: 70.42 mL/min (ref >60.00)
Glucose, Bld: 93 mg/dL (ref 70–99)
Potassium: 3.8 mEq/L (ref 3.5–5.1)
Sodium: 138 mEq/L (ref 135–145)
Total Bilirubin: 1.7 mg/dL — ABNORMAL HIGH (ref 0.2–1.2)
Total Protein: 7.6 g/dL (ref 6.0–8.3)

## 2022-03-08 LAB — CBC
HCT: 43.6 % (ref 39.0–52.0)
Hemoglobin: 14.9 g/dL (ref 13.0–17.0)
MCHC: 34.1 g/dL (ref 30.0–36.0)
MCV: 88.1 fl (ref 78.0–100.0)
Platelets: 224 10*3/uL (ref 150.0–400.0)
RBC: 4.95 Mil/uL (ref 4.22–5.81)
RDW: 14 % (ref 11.5–15.5)
WBC: 6.8 10*3/uL (ref 4.0–10.5)

## 2022-03-08 LAB — LIPID PANEL
Cholesterol: 258 mg/dL — ABNORMAL HIGH (ref 0–200)
HDL: 70.9 mg/dL (ref >39.00)
LDL Cholesterol: 170 mg/dL — ABNORMAL HIGH (ref 0–99)
NonHDL: 187.29
Total CHOL/HDL Ratio: 4
Triglycerides: 86 mg/dL (ref 0.0–149.0)
VLDL: 17.2 mg/dL (ref 0.0–40.0)

## 2022-03-08 LAB — HEMOGLOBIN A1C: Hgb A1c MFr Bld: 7 % — ABNORMAL HIGH (ref 4.6–6.5)

## 2022-03-08 LAB — MICROALBUMIN / CREATININE URINE RATIO
Creatinine,U: 263.7 mg/dL
Microalb Creat Ratio: 6.6 mg/g (ref 0.0–30.0)
Microalb, Ur: 17.5 mg/dL — ABNORMAL HIGH (ref 0.0–1.9)

## 2022-04-04 LAB — COLOGUARD

## 2022-04-08 ENCOUNTER — Other Ambulatory Visit: Payer: Self-pay | Admitting: Family Medicine

## 2022-04-08 DIAGNOSIS — I10 Essential (primary) hypertension: Secondary | ICD-10-CM

## 2022-04-08 MED ORDER — OLMESARTAN MEDOXOMIL 20 MG PO TABS: 20.0000 mg | ORAL_TABLET | Freq: Every day | ORAL | 2 refills | Status: DC

## 2022-04-19 ENCOUNTER — Other Ambulatory Visit: Payer: Self-pay | Admitting: Family Medicine

## 2022-04-19 DIAGNOSIS — Z1211 Encounter for screening for malignant neoplasm of colon: Secondary | ICD-10-CM

## 2022-04-19 LAB — COLOGUARD

## 2022-04-26 ENCOUNTER — Telehealth: Payer: Self-pay | Admitting: Family Medicine

## 2022-04-26 NOTE — Telephone Encounter (Signed)
Patient states he received 3 different colonoscopy kits at his house but he has already sent one back. He would like to know what to do. Please advise.

## 2022-04-26 NOTE — Telephone Encounter (Signed)
The first 2 most likely expired

## 2022-04-26 NOTE — Telephone Encounter (Signed)
2 of the boxes the samples were not able to be processed. Called the patient informed him to call Cologuard at  [phone] to find out what the problem is He agreed to do so.

## 2022-05-03 LAB — COLOGUARD: COLOGUARD: NEGATIVE

## 2022-07-14 ENCOUNTER — Telehealth: Payer: Self-pay | Admitting: Family Medicine

## 2022-07-14 NOTE — Telephone Encounter (Signed)
Please call the Pt back- he really needs to be triaged with these symptoms.

## 2022-07-14 NOTE — Telephone Encounter (Signed)
Patient has called stating that the new blood pressure the doctor has him taking is making him very drowsy, dry mouth, headaches, and swelling. He does not want to continue taking the medication anymore, he preferred the old blood pressure medication.  Please advise.

## 2023-05-08 ENCOUNTER — Telehealth: Payer: Self-pay

## 2023-05-08 NOTE — Transitions of Care (Post Inpatient/ED Visit) (Signed)
   05/08/2023  Name: Maxwell Hanson MRN: [id number] DOB: 12/29/1950  Today's TOC FU Call Status: Today's TOC FU Call Status:: Successful TOC FU Call Completed TOC FU Call Complete Date: 05/08/23 Patient's Name and Date of Birth confirmed.  Transition Care Management Follow-up Telephone Call Date of Discharge: 05/10/23 Discharge Facility: Other [facility]) Name of Other (Non[facility]) Discharge Facility: [facility] Type of Discharge: Inpatient Admission Primary Inpatient Discharge Diagnosis:: hypertension How have you been since you were released from the hospital?: Better Any questions or concerns?: No  Items Reviewed: Did you receive and understand the discharge instructions provided?: Yes Medications obtained,verified, and reconciled?: Yes (Medications Reviewed) Any new allergies since your discharge?: No Dietary orders reviewed?: Yes Do you have support at home?: Yes People in Home: spouse  Medications Reviewed Today: Medications Reviewed Today     Reviewed by Karena Addison, LPN (Licensed Practical Nurse) on 05/08/23 at 1106  Med List Status: <None>   Medication Order Taking? Sig Documenting Provider Last Dose Status Informant  Magnesium 250 MG TABS [id number] No Take 1 tablet by mouth daily. [provider] Taking Active   olmesartan (BENICAR) 20 MG tablet [id number]  Take 1 tablet (20 mg total) by mouth daily. Sharlene Dory, DO  Active   sildenafil (VIAGRA) 100 MG tablet [id number]  Take 0.5-1 tablets (50-100 mg total) by mouth daily as needed for erectile dysfunction. Sharlene Dory, DO  Active             Home Care and Equipment/Supplies: Were Home Health Services Ordered?: NA Any new equipment or medical supplies ordered?: NA  Functional Questionnaire: Do you need assistance with bathing/showering or dressing?: No Do you need assistance with meal preparation?: No Do you need assistance with eating?: No Do you have difficulty maintaining  continence: No Do you need assistance with getting out of bed/getting out of a chair/moving?: No Do you have difficulty managing or taking your medications?: No  Follow up appointments reviewed: PCP Follow-up appointment confirmed?: Yes Date of PCP follow-up appointment?: 05/10/23 Follow-up Provider: [facility] Follow-up appointment confirmed?: No (needs referral to neuro) Reason Specialist Follow-Up Not Confirmed: [facility] Calling Clinician Notified Provider Practice of Needed Appointment Do you need transportation to your follow-up appointment?: No Do you understand care options if your condition(s) worsen?: Yes-patient verbalized understanding    SIGNATURE Karena Addison, LPN [facility] Nurse Health Advisor Direct Dial [phone]

## 2023-05-10 ENCOUNTER — Ambulatory Visit (INDEPENDENT_AMBULATORY_CARE_PROVIDER_SITE_OTHER): Admitting: Family Medicine

## 2023-05-10 ENCOUNTER — Encounter: Payer: Self-pay | Admitting: Family Medicine

## 2023-05-10 VITALS — BP 148/98 | HR 58 | Ht 70.0 in | Wt 201.2 lb

## 2023-05-10 DIAGNOSIS — M609 Myositis, unspecified: Secondary | ICD-10-CM | POA: Diagnosis not present

## 2023-05-10 DIAGNOSIS — T466X5A Adverse effect of antihyperlipidemic and antiarteriosclerotic drugs, initial encounter: Secondary | ICD-10-CM

## 2023-05-10 DIAGNOSIS — E119 Type 2 diabetes mellitus without complications: Secondary | ICD-10-CM | POA: Diagnosis not present

## 2023-05-10 DIAGNOSIS — I10 Essential (primary) hypertension: Secondary | ICD-10-CM | POA: Diagnosis not present

## 2023-05-10 LAB — CBC
HCT: 45 % (ref 39.0–52.0)
Hemoglobin: 15.4 g/dL (ref 13.0–17.0)
MCHC: 34.2 g/dL (ref 30.0–36.0)
MCV: 88 fl (ref 78.0–100.0)
Platelets: 208 10*3/uL (ref 150.0–400.0)
RBC: 5.12 Mil/uL (ref 4.22–5.81)
RDW: 14.3 % (ref 11.5–15.5)
WBC: 5.5 10*3/uL (ref 4.0–10.5)

## 2023-05-10 LAB — COMPREHENSIVE METABOLIC PANEL WITH GFR
ALT: 58 U/L — ABNORMAL HIGH (ref 0–53)
AST: 62 U/L — ABNORMAL HIGH (ref 0–37)
Albumin: 4 g/dL (ref 3.5–5.2)
Alkaline Phosphatase: 95 U/L (ref 39–117)
BUN: 14 mg/dL (ref 6–23)
CO2: 28 meq/L (ref 19–32)
Calcium: 9.9 mg/dL (ref 8.4–10.5)
Chloride: 100 meq/L (ref 96–112)
Creatinine, Ser: 1.1 mg/dL (ref 0.40–1.50)
GFR: 66.81 mL/min (ref >60.00)
Glucose, Bld: 149 mg/dL — ABNORMAL HIGH (ref 70–99)
Potassium: 3.7 meq/L (ref 3.5–5.1)
Sodium: 135 meq/L (ref 135–145)
Total Bilirubin: 1.3 mg/dL — ABNORMAL HIGH (ref 0.2–1.2)
Total Protein: 7.8 g/dL (ref 6.0–8.3)

## 2023-05-10 LAB — MICROALBUMIN / CREATININE URINE RATIO
Creatinine,U: 93.5 mg/dL
Microalb Creat Ratio: 398.7 mg/g — ABNORMAL HIGH (ref 0.0–30.0)
Microalb, Ur: 37.3 mg/dL — ABNORMAL HIGH (ref 0.0–1.9)

## 2023-05-10 LAB — HEMOGLOBIN A1C: Hgb A1c MFr Bld: 6.7 % — ABNORMAL HIGH (ref 4.6–6.5)

## 2023-05-10 MED ORDER — HYDROCHLOROTHIAZIDE 25 MG PO TABS: 25.0000 mg | ORAL_TABLET | Freq: Every day | ORAL | 3 refills | Status: AC

## 2023-05-10 NOTE — Patient Instructions (Signed)
 Give [place] 2-3 business days to get the results of your labs back.   Keep the diet clean and stay active.  Aim to do some physical exertion for 150 minutes per week. This is typically divided into 5 days per week, 30 minutes per day. The activity should be enough to get your heart rate up. Anything is better than nothing if you have time constraints.  Check your blood pressures 2-3 times per week, alternating the time of day you check it. If it is high, considering waiting 1-2 minutes and rechecking. If it gets higher, your anxiety is likely creeping up and we should avoid rechecking.   If you do not hear anything about your referral in the next 1-2 weeks, call our office and ask for an update.  Let us know if you need anything.

## 2023-05-10 NOTE — Progress Notes (Signed)
 Chief Complaint  Patient presents with   Hospitalization Follow-up    Patient presents today for hospital follow-up.    HPI Maxwell Hanson is a 73 y.o. y.o. male who presents for a transition of care visit.  Pt was discharged from [facility] on 05/07/23.  He is here with his wife.  Patient was admitted to [facility] on 05/06/2023 after having left-sided weakness and is upper and lower extremity.  It resolved after several minutes.  Initial imaging was unremarkable.  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  He is statin intolerant and was placed on Zetia 10 mg daily.  Blood pressure was extremely high.  He has not been taking any of his medicine as he has been trying to handle things naturally.  He was started on Coreg 6.25 mg twice daily and hydrochlorothiazide 12.5 mg daily.  He has been on dual antiplatelet therapy with aspirin Plavix.  He reports compliance with his medication and no adverse effects.  He does not check his sugars at home routinely.  Denies any chest pain or shortness of breath.  He has no lingering weakness, difficulty swallowing, trouble with speech, or vision changes.  Past Medical History:  Diagnosis Date   DDD (degenerative disc disease), lumbar    Diabetes (HCC)    Hyperlipidemia    Hypertension    Refuses treatment 11/10/2017   statin   Past Surgical History:  Procedure Laterality Date   HYDROCELE EXCISION / REPAIR     KIDNEY STONE SURGERY  2013   TOOTH EXTRACTION     Full Dentures   Family History  Problem Relation Age of Onset   Hypertension Mother        Living   Arthritis Mother    Stroke Father 63       Deceased   Alcohol abuse Father    Heart disease Sister        #1   Hypertension Sister        #5   Diabetes Brother        Deceased   Stroke Brother        #2   Alcohol abuse Brother        #2   Throat cancer Brother        #3   Arthritis/Rheumatoid Daughter    Healthy Daughter        #2   Alcohol abuse Son     Heart attack Neg Hx    Allergies as of 05/10/2023       Reactions   Metformin And Related Rash   Caused Exacerbation in Psoriasis [skin dry & peeled]        Medication List        Accurate as of May 10, 2023 12:25 PM. If you have any questions, ask your nurse or doctor.          STOP taking these medications    olmesartan 20 MG tablet Commonly known as: BENICAR Stopped by: Jilda Roche Jaking Thayer       TAKE these medications    ascorbic acid 500 MG tablet Commonly known as: VITAMIN C Take 500 mg by mouth daily.   aspirin 81 MG chewable tablet Chew 81 mg by mouth daily.   carvedilol 6.25 MG tablet Commonly known as: COREG Take 6.25 mg by mouth 2 (two) times daily with a meal.   clopidogrel 75 MG tablet Commonly known as: PLAVIX Take 75 mg by mouth daily.  cyanocobalamin 500 MCG tablet Commonly known as: VITAMIN B12 Take 500 mcg by mouth daily.   ezetimibe 10 MG tablet Commonly known as: ZETIA Take 10 mg by mouth daily.   hydrochlorothiazide 25 MG tablet Commonly known as: HYDRODIURIL Take 1 tablet (25 mg total) by mouth daily. What changed:  medication strength how much to take Changed by: Sharlene Dory   Magnesium 250 MG Tabs Take 1 tablet by mouth daily.   sildenafil 100 MG tablet Commonly known as: Viagra Take 0.5-1 tablets (50-100 mg total) by mouth daily as needed for erectile dysfunction.   thiamine 50 MG tablet Commonly known as: VITAMIN B-1 Take 100 mg by mouth daily.   vitamin E 200 UNIT capsule Take by mouth daily.        ROS:  Constitutional: No fevers or chills, no weight loss HEENT: No headaches, hearing loss, or runny nose, no sore throat Heart: No chest pain Lungs: No SOB, no cough Abd: No bowel changes, no pain, no N/V GU: No urinary complaints Neuro: No numbness, tingling or weakness Msk: No joint or muscle pain  Objective BP (!) 148/98   Pulse (!) 58   Ht 5\' 10"  (1.778 m)   Wt 201 lb 3.2 oz  (91.3 kg)   SpO2 98%   BMI 28.87 kg/m  General Appearance:  awake, alert, oriented, in no acute distress and well developed, well nourished Skin:  there are no suspicious lesions or rashes of concern Head/face:  NCAT Lungs: Clear to auscultation.  No rales, rhonchi, or wheezing. Normal effort, no accessory muscle use. Heart:  Heart sounds are normal.  Regular rate and rhythm without murmur, gallop or rub. No bruits. Abdomen:  BS+, soft, NT, ND, no masses or organomegaly Musculoskeletal:  No muscle group atrophy or asymmetry Neurologic:  Alert and oriented x 3, gait normal., reflexes normal and symmetric, 5/5 strength throughout Psych exam: Nml mood and affect, age appropriate judgment and insight  Statin-induced myositis  Essential hypertension, benign  Type 2 diabetes mellitus without complication, without long-term current use of insulin (HCC) - Plan: Comprehensive metabolic panel with GFR, CBC, Microalbumin / creatinine urine ratio, Hemoglobin A1c, Ambulatory referral to Ophthalmology  He will continue Zetia as he cannot tolerate statins. Chronic, controlled.  Counseled on diet and exercise.  Monitor blood pressure at home.  Continue Coreg 6.25 mg twice daily, add higher dosage of hydrochlorothiazide increasing from 12.5 mg to 25 mg daily.  Follow-up in 1 month to recheck this. Chronic, hopefully stable.  Check above labs.  Refer to ophthalmology. He will continue dual antiplatelet therapy for a total of 21 days.  Then we will transition to aspirin alone. We did have a conversation about sticking to more mainstream medicine rather than natural options which do not have robust data to support use. The patient and his wife voiced understanding and agreement to the plan.  Jilda Roche [place], DO 05/10/23 12:25 PM

## 2023-06-12 ENCOUNTER — Encounter: Payer: Self-pay | Admitting: Family Medicine

## 2023-06-12 ENCOUNTER — Ambulatory Visit (INDEPENDENT_AMBULATORY_CARE_PROVIDER_SITE_OTHER): Admitting: Family Medicine

## 2023-06-12 VITALS — BP 140/70 | HR 61 | Temp 98.0°F | Resp 16 | Ht 70.0 in | Wt 201.0 lb

## 2023-06-12 DIAGNOSIS — R809 Proteinuria, unspecified: Secondary | ICD-10-CM | POA: Diagnosis not present

## 2023-06-12 DIAGNOSIS — I10 Essential (primary) hypertension: Secondary | ICD-10-CM

## 2023-06-12 DIAGNOSIS — M6740 Ganglion, unspecified site: Secondary | ICD-10-CM | POA: Diagnosis not present

## 2023-06-12 MED ORDER — OLMESARTAN MEDOXOMIL 20 MG PO TABS: 20.0000 mg | ORAL_TABLET | Freq: Every day | ORAL | 2 refills | Status: DC

## 2023-06-12 NOTE — Patient Instructions (Signed)
 Give us  2-3 business days to get the results of your labs back.   Keep the diet clean and stay active.  Aim to do some physical exertion for 150 minutes per week. This is typically divided into 5 days per week, 30 minutes per day. The activity should be enough to get your heart rate up. Anything is better than nothing if you have time constraints.  Continue monitoring your blood pressure at home.   Let us  know if you need anything.

## 2023-06-12 NOTE — Progress Notes (Signed)
 Chief Complaint  Patient presents with   Follow-up    Follow up    Subjective Maxwell Hanson is a 73 y.o. male who presents for hypertension follow up. He does monitor home blood pressures. Blood pressures ranging from 150-160's/70's on average. He is compliant with medications- hydrochlorothiazide  25 mg/d.  He had stopped Coreg 6.25 mg twice daily. Patient has these side effects of medication: none He is sometimes adhering to a healthy diet overall. Current exercise: none No CP or SOB.   Patient had an elevated microalbumin creatinine ratio 1 month ago.  He is not on an ACE/ARB.  Patient has a longstanding history of a bump on his left thumb.  It is not painful but it does bother him.  No drainage, redness, bruising.  It is not growing.   Past Medical History:  Diagnosis Date   DDD (degenerative disc disease), lumbar    Diabetes (HCC)    Hyperlipidemia    Hypertension    Refuses treatment 11/10/2017   statin    Exam BP (!) 140/70 (BP Location: Right Leg, Patient Position: Sitting)   Pulse 61   Temp 98 F (36.7 C) (Oral)   Resp 16   Ht 5\' 10"  (1.778 m)   Wt 201 lb (91.2 kg)   SpO2 99%   BMI 28.84 kg/m  General:  well developed, well nourished, in no apparent distress Skin: Over the medial proximal phalanx of the left thumb, there is a raised and flesh-colored elliptically shaped lesion measuring approximately 1.2 cm x 0.6 cm.  It is freely movable and semisolid in nature.  There is no TTP, fluctuance, drainage, or excoriation. Heart: RRR, no bruits, no LE edema Lungs: clear to auscultation, no accessory muscle use Psych: well oriented with normal range of affect and appropriate judgment/insight  Essential hypertension, benign - Plan: Basic metabolic panel with GFR, olmesartan  (BENICAR ) 20 MG tablet  Proteinuria, unspecified type - Plan: Microalbumin / creatinine urine ratio, olmesartan  (BENICAR ) 20 MG tablet  Ganglion cyst - Plan: Ambulatory referral to Hand  Surgery  Chronic, uncontrolled.  Continue hydrochlorothiazide  25 mg daily.  Add olmesartan  20 mg daily.  Monitor blood pressure at home.  Counseled on diet and exercise. F/u in 1 month. Recheck today.  Will start ARB as above anyway.  May need to add Jardiance if this does not resolve in the next few months. Refer to hand surgery. The patient voiced understanding and agreement to the plan.  Shellie Dials [place], DO 06/12/23  4:10 PM

## 2023-06-13 LAB — BASIC METABOLIC PANEL WITH GFR
BUN: 15 mg/dL (ref 6–23)
CO2: 27 meq/L (ref 19–32)
Calcium: 9.5 mg/dL (ref 8.4–10.5)
Chloride: 102 meq/L (ref 96–112)
Creatinine, Ser: 1.29 mg/dL (ref 0.40–1.50)
GFR: 55.14 mL/min — ABNORMAL LOW (ref >60.00)
Glucose, Bld: 197 mg/dL — ABNORMAL HIGH (ref 70–99)
Potassium: 3.5 meq/L (ref 3.5–5.1)
Sodium: 139 meq/L (ref 135–145)

## 2023-06-13 LAB — MICROALBUMIN / CREATININE URINE RATIO
Creatinine,U: 217.9 mg/dL
Microalb Creat Ratio: 176.4 mg/g — ABNORMAL HIGH (ref 0.0–30.0)
Microalb, Ur: 38.4 mg/dL — ABNORMAL HIGH (ref 0.0–1.9)

## 2023-08-03 ENCOUNTER — Telehealth: Payer: Self-pay | Admitting: Family Medicine

## 2023-08-03 NOTE — Telephone Encounter (Signed)
 Copied from CRM [id number]. Topic: General - Call Back - No Documentation >> Aug 03, 2023  3:48 PM Shereese L wrote: Reason for CRM: [place] from [facility] Reached out several times and was not seen for an exam

## 2023-08-31 ENCOUNTER — Telehealth: Payer: Self-pay | Admitting: Family Medicine

## 2023-08-31 NOTE — Telephone Encounter (Signed)
 Copied from CRM [id number]. Topic: Medicare AWV >> Aug 31, 2023  1:34 PM Nathanel DEL wrote: Reason for CRM: Called 08/31/2023 to sched AWV - NO VOICEMAIL  Nathanel Paschal; Care Guide Ambulatory Clinical Support [place] [facility] Direct Dial: [phone]

## 2023-09-12 ENCOUNTER — Encounter: Payer: Self-pay | Admitting: Family Medicine

## 2023-09-12 ENCOUNTER — Ambulatory Visit: Admitting: Family Medicine

## 2023-09-12 VITALS — BP 136/74 | HR 63 | Temp 98.0°F | Resp 16 | Ht 70.0 in | Wt 198.4 lb

## 2023-09-12 DIAGNOSIS — N183 Chronic kidney disease, stage 3 unspecified: Secondary | ICD-10-CM

## 2023-09-12 DIAGNOSIS — I10 Essential (primary) hypertension: Secondary | ICD-10-CM | POA: Diagnosis not present

## 2023-09-12 DIAGNOSIS — E1122 Type 2 diabetes mellitus with diabetic chronic kidney disease: Secondary | ICD-10-CM

## 2023-09-12 DIAGNOSIS — R809 Proteinuria, unspecified: Secondary | ICD-10-CM

## 2023-09-12 LAB — BASIC METABOLIC PANEL WITH GFR
BUN: 20 mg/dL (ref 6–23)
CO2: 28 meq/L (ref 19–32)
Calcium: 10 mg/dL (ref 8.4–10.5)
Chloride: 101 meq/L (ref 96–112)
Creatinine, Ser: 1.24 mg/dL (ref 0.40–1.50)
GFR: 57.72 mL/min — ABNORMAL LOW (ref >60.00)
Glucose, Bld: 181 mg/dL — ABNORMAL HIGH (ref 70–99)
Potassium: 3.9 meq/L (ref 3.5–5.1)
Sodium: 137 meq/L (ref 135–145)

## 2023-09-12 LAB — LIPID PANEL
Cholesterol: 276 mg/dL — ABNORMAL HIGH (ref 0–200)
HDL: 57.8 mg/dL (ref >39.00)
LDL Cholesterol: 182 mg/dL — ABNORMAL HIGH (ref 0–99)
NonHDL: 218.06
Total CHOL/HDL Ratio: 5
Triglycerides: 181 mg/dL — ABNORMAL HIGH (ref 0.0–149.0)
VLDL: 36.2 mg/dL (ref 0.0–40.0)

## 2023-09-12 LAB — MICROALBUMIN / CREATININE URINE RATIO
Creatinine,U: 371.2 mg/dL
Microalb Creat Ratio: 26 mg/g (ref 0.0–30.0)
Microalb, Ur: 9.6 mg/dL — ABNORMAL HIGH (ref 0.0–1.9)

## 2023-09-12 NOTE — Progress Notes (Signed)
 CC: F/u  Subjective Maxwell Hanson is a 73 y.o. male who presents for hypertension follow up. He does not routinely monitor home blood pressures. He is compliant with medications- hydrochlorothiazide  25 mg/d, olmesartan  20 mg/d. Patient has these side effects of medication: none He is usually adhering to a healthy diet overall. Current exercise: active in the yard No CP or SOB.   Proteinuria last couple times checking. No urinary complaints. On ARB as above.  He is not on an SGLT2 inhibitor.  He has a history of diabetes.  He does not monitor.  He is diet controlled.  Last A1c was 6.7.  He is not on a statin anymore and does not wish to go back.   Past Medical History:  Diagnosis Date   DDD (degenerative disc disease), lumbar    Diabetes (HCC)    Hyperlipidemia    Hypertension    Refuses treatment 11/10/2017   statin    Exam BP 136/74 (BP Location: Left Arm, Patient Position: Sitting)   Pulse 63   Temp 98 F (36.7 C) (Oral)   Resp 16   Ht 5' 10 (1.778 m)   Wt 198 lb 6.4 oz (90 kg)   SpO2 98%   BMI 28.47 kg/m  General:  well developed, well nourished, in no apparent distress Heart: RRR, no bruits, no LE edema Lungs: clear to auscultation, no accessory muscle use Psych: well oriented with normal range of affect and appropriate judgment/insight  Essential hypertension, benign  Proteinuria, unspecified type - Plan: Basic metabolic panel with GFR, Microalbumin / creatinine urine ratio  Type 2 diabetes mellitus with stage 3 chronic kidney disease, without long-term current use of insulin, unspecified whether stage 3a or 3b CKD (HCC) - Plan: Lipid panel  Chronic, stable.  Continue hydrochlorothiazide  25 mg daily, olmesartan  20 mg daily.  Counseled on diet and exercise. Chronic, not likely to be stable.  We discussed SGLT2 inhibitors which he is not keen on going on.  He will let me know if he changes his mind. Chronic, stable.  We discussed statin therapy which he is not  interested in either.  He will let me know if he changes his mind. F/u pending the above. The patient voiced understanding and agreement to the plan.  Mabel [place], DO 09/12/23  9:23 AM

## 2023-09-12 NOTE — Patient Instructions (Signed)
 Give us  2-3 business days to get the results of your labs back.   Keep the diet clean and stay active.  Let us  know if you change your mind about a medicine to protect your kidneys or the statin.   Let us  know if you need anything.

## 2023-09-13 ENCOUNTER — Ambulatory Visit: Payer: Self-pay | Admitting: Family Medicine

## 2023-09-29 ENCOUNTER — Other Ambulatory Visit: Payer: Self-pay | Admitting: Family Medicine

## 2023-09-29 DIAGNOSIS — I10 Essential (primary) hypertension: Secondary | ICD-10-CM

## 2023-09-29 DIAGNOSIS — R809 Proteinuria, unspecified: Secondary | ICD-10-CM
